# Patient Record
Sex: Male | Born: 1940 | Race: White | Hispanic: No | Marital: Married | Smoking: Never smoker
Health system: Southern US, Community
[De-identification: ages and names within clinical notes are randomized; demographics above are authoritative.]

## PROBLEM LIST (undated history)

## (undated) DIAGNOSIS — E785 Hyperlipidemia, unspecified: Secondary | ICD-10-CM

## (undated) DIAGNOSIS — I4892 Unspecified atrial flutter: Secondary | ICD-10-CM

## (undated) DIAGNOSIS — K219 Gastro-esophageal reflux disease without esophagitis: Secondary | ICD-10-CM

## (undated) DIAGNOSIS — I2510 Atherosclerotic heart disease of native coronary artery without angina pectoris: Secondary | ICD-10-CM

## (undated) DIAGNOSIS — I219 Acute myocardial infarction, unspecified: Secondary | ICD-10-CM

## (undated) DIAGNOSIS — I10 Essential (primary) hypertension: Secondary | ICD-10-CM

## (undated) DIAGNOSIS — F1011 Alcohol abuse, in remission: Secondary | ICD-10-CM

## (undated) DIAGNOSIS — G4730 Sleep apnea, unspecified: Secondary | ICD-10-CM

## (undated) HISTORY — DX: Hyperlipidemia, unspecified: E78.5

## (undated) HISTORY — DX: Alcohol abuse, in remission: F10.11

## (undated) HISTORY — DX: Essential (primary) hypertension: I10

## (undated) HISTORY — DX: Atherosclerotic heart disease of native coronary artery without angina pectoris: I25.10

## (undated) HISTORY — DX: Gastro-esophageal reflux disease without esophagitis: K21.9

## (undated) HISTORY — PX: CORONARY STENT PLACEMENT: SHX1402

## (undated) HISTORY — PX: CARDIAC CATHETERIZATION: SHX172

## (undated) HISTORY — DX: Unspecified atrial flutter: I48.92

---

## 1999-09-15 ENCOUNTER — Inpatient Hospital Stay (HOSPITAL_COMMUNITY): Admission: EM | Admit: 1999-09-15 | Discharge: 1999-09-20 | Payer: Self-pay | Admitting: Emergency Medicine

## 2000-06-11 ENCOUNTER — Ambulatory Visit (HOSPITAL_COMMUNITY): Admission: RE | Admit: 2000-06-11 | Discharge: 2000-06-11 | Payer: Self-pay | Admitting: Gastroenterology

## 2000-06-11 ENCOUNTER — Encounter: Payer: Self-pay | Admitting: Gastroenterology

## 2004-08-15 ENCOUNTER — Ambulatory Visit: Payer: Self-pay | Admitting: Internal Medicine

## 2004-09-25 ENCOUNTER — Ambulatory Visit (HOSPITAL_COMMUNITY): Admission: RE | Admit: 2004-09-25 | Discharge: 2004-09-25 | Payer: Self-pay | Admitting: Internal Medicine

## 2004-09-25 ENCOUNTER — Ambulatory Visit: Payer: Self-pay | Admitting: Internal Medicine

## 2004-09-25 HISTORY — PX: COLONOSCOPY: SHX174

## 2005-01-18 ENCOUNTER — Ambulatory Visit: Payer: Self-pay | Admitting: Cardiovascular Disease

## 2005-11-08 ENCOUNTER — Ambulatory Visit: Payer: Self-pay | Admitting: Internal Medicine

## 2006-03-29 ENCOUNTER — Ambulatory Visit: Payer: Self-pay | Admitting: Cardiovascular Disease

## 2006-04-29 ENCOUNTER — Ambulatory Visit: Payer: Self-pay

## 2006-04-29 ENCOUNTER — Ambulatory Visit: Payer: Self-pay | Admitting: Cardiovascular Disease

## 2006-11-01 ENCOUNTER — Ambulatory Visit: Payer: Self-pay | Admitting: Cardiovascular Disease

## 2006-11-06 ENCOUNTER — Ambulatory Visit: Payer: Self-pay | Admitting: Cardiovascular Disease

## 2006-11-13 ENCOUNTER — Ambulatory Visit: Payer: Self-pay | Admitting: Internal Medicine

## 2006-11-14 ENCOUNTER — Ambulatory Visit: Payer: Self-pay | Admitting: Cardiovascular Disease

## 2006-11-14 ENCOUNTER — Ambulatory Visit (HOSPITAL_COMMUNITY): Admission: RE | Admit: 2006-11-14 | Discharge: 2006-11-14 | Payer: Self-pay | Admitting: Cardiovascular Disease

## 2006-11-22 ENCOUNTER — Ambulatory Visit: Payer: Self-pay | Admitting: Internal Medicine

## 2006-12-04 ENCOUNTER — Ambulatory Visit: Payer: Self-pay | Admitting: Internal Medicine

## 2006-12-04 ENCOUNTER — Encounter (INDEPENDENT_AMBULATORY_CARE_PROVIDER_SITE_OTHER): Payer: Self-pay | Admitting: Specialist

## 2006-12-04 ENCOUNTER — Ambulatory Visit (HOSPITAL_COMMUNITY): Admission: RE | Admit: 2006-12-04 | Discharge: 2006-12-04 | Payer: Self-pay | Admitting: Internal Medicine

## 2006-12-04 HISTORY — PX: COLONOSCOPY: SHX174

## 2007-03-11 ENCOUNTER — Ambulatory Visit: Payer: Self-pay | Admitting: Internal Medicine

## 2007-04-02 ENCOUNTER — Ambulatory Visit (HOSPITAL_COMMUNITY): Admission: RE | Admit: 2007-04-02 | Discharge: 2007-04-02 | Payer: Self-pay | Admitting: Internal Medicine

## 2007-04-02 ENCOUNTER — Encounter: Payer: Self-pay | Admitting: Internal Medicine

## 2007-04-02 ENCOUNTER — Ambulatory Visit: Payer: Self-pay | Admitting: Internal Medicine

## 2007-04-02 HISTORY — PX: COLONOSCOPY: SHX174

## 2008-02-10 ENCOUNTER — Ambulatory Visit (HOSPITAL_COMMUNITY): Admission: RE | Admit: 2008-02-10 | Discharge: 2008-02-10 | Payer: Self-pay | Admitting: Ophthalmology

## 2008-06-02 DIAGNOSIS — E785 Hyperlipidemia, unspecified: Secondary | ICD-10-CM

## 2008-06-02 DIAGNOSIS — I10 Essential (primary) hypertension: Secondary | ICD-10-CM | POA: Insufficient documentation

## 2009-08-05 ENCOUNTER — Encounter: Payer: Self-pay | Admitting: Cardiovascular Disease

## 2009-08-17 ENCOUNTER — Ambulatory Visit: Payer: Self-pay | Admitting: Cardiovascular Disease

## 2010-01-19 ENCOUNTER — Encounter (INDEPENDENT_AMBULATORY_CARE_PROVIDER_SITE_OTHER): Payer: Self-pay

## 2010-02-10 ENCOUNTER — Encounter: Payer: Self-pay | Admitting: Cardiovascular Disease

## 2010-04-17 ENCOUNTER — Encounter (INDEPENDENT_AMBULATORY_CARE_PROVIDER_SITE_OTHER): Payer: Self-pay | Admitting: *Deleted

## 2010-07-24 ENCOUNTER — Telehealth (INDEPENDENT_AMBULATORY_CARE_PROVIDER_SITE_OTHER): Payer: Self-pay | Admitting: *Deleted

## 2010-07-25 ENCOUNTER — Ambulatory Visit: Payer: Self-pay | Admitting: Cardiovascular Disease

## 2010-08-01 ENCOUNTER — Telehealth: Payer: Self-pay | Admitting: Cardiovascular Disease

## 2010-08-07 ENCOUNTER — Telehealth (INDEPENDENT_AMBULATORY_CARE_PROVIDER_SITE_OTHER): Payer: Self-pay | Admitting: Radiology

## 2010-08-08 ENCOUNTER — Encounter: Payer: Self-pay | Admitting: Cardiology

## 2010-08-08 ENCOUNTER — Ambulatory Visit: Payer: Self-pay

## 2010-08-08 ENCOUNTER — Ambulatory Visit: Payer: Self-pay | Admitting: Cardiology

## 2010-08-08 ENCOUNTER — Ambulatory Visit (HOSPITAL_COMMUNITY): Admission: RE | Admit: 2010-08-08 | Discharge: 2010-08-08 | Payer: Self-pay | Admitting: Cardiovascular Disease

## 2010-08-08 ENCOUNTER — Encounter: Payer: Self-pay | Admitting: Cardiovascular Disease

## 2010-08-08 ENCOUNTER — Encounter (HOSPITAL_COMMUNITY)
Admission: RE | Admit: 2010-08-08 | Discharge: 2010-10-17 | Payer: Self-pay | Source: Home / Self Care | Attending: Cardiovascular Disease | Admitting: Cardiovascular Disease

## 2010-08-09 ENCOUNTER — Ambulatory Visit: Payer: Self-pay | Admitting: Cardiology

## 2010-08-09 LAB — CONVERTED CEMR LAB: POC INR: 3.4

## 2010-08-14 ENCOUNTER — Ambulatory Visit: Payer: Self-pay | Admitting: Cardiology

## 2010-08-14 LAB — CONVERTED CEMR LAB: POC INR: 7.5

## 2010-08-16 ENCOUNTER — Encounter: Payer: Self-pay | Admitting: Cardiovascular Disease

## 2010-08-16 ENCOUNTER — Ambulatory Visit: Payer: Self-pay | Admitting: Cardiology

## 2010-08-16 LAB — CONVERTED CEMR LAB: POC INR: 4.3

## 2010-08-17 ENCOUNTER — Encounter: Payer: Self-pay | Admitting: Cardiovascular Disease

## 2010-08-17 ENCOUNTER — Inpatient Hospital Stay (HOSPITAL_COMMUNITY)
Admission: AD | Admit: 2010-08-17 | Discharge: 2010-08-19 | Payer: Self-pay | Source: Home / Self Care | Admitting: Internal Medicine

## 2010-08-17 HISTORY — PX: COLONOSCOPY: SHX174

## 2010-08-17 LAB — CONVERTED CEMR LAB
Basophils Absolute: 0 10*3/uL (ref 0.0–0.1)
Basophils Relative: 0 % (ref 0–1)
Hemoglobin: 7 g/dL — ABNORMAL LOW (ref 13.0–17.0)
Lymphocytes Relative: 18 % (ref 12–46)
MCHC: 29.5 g/dL — ABNORMAL LOW (ref 30.0–36.0)
Monocytes Absolute: 0.9 10*3/uL (ref 0.1–1.0)
Neutro Abs: 5.5 10*3/uL (ref 1.7–7.7)
Neutrophils Relative %: 69 % (ref 43–77)
Platelets: 293 10*3/uL (ref 150–400)
RDW: 16.2 % — ABNORMAL HIGH (ref 11.5–15.5)

## 2010-08-18 HISTORY — PX: UPPER GASTROINTESTINAL ENDOSCOPY: SHX188

## 2010-08-21 ENCOUNTER — Encounter: Payer: Self-pay | Admitting: Cardiology

## 2010-08-23 ENCOUNTER — Ambulatory Visit: Payer: Self-pay | Admitting: Cardiovascular Disease

## 2010-08-24 ENCOUNTER — Ambulatory Visit: Payer: Self-pay | Admitting: Adult Health

## 2010-08-25 ENCOUNTER — Ambulatory Visit: Payer: Self-pay | Admitting: Internal Medicine

## 2010-08-25 ENCOUNTER — Ambulatory Visit (HOSPITAL_COMMUNITY)
Admission: RE | Admit: 2010-08-25 | Discharge: 2010-08-25 | Payer: Self-pay | Source: Home / Self Care | Attending: Internal Medicine | Admitting: Internal Medicine

## 2010-09-17 HISTORY — PX: SUBTOTAL COLECTOMY: SHX855

## 2010-10-01 ENCOUNTER — Inpatient Hospital Stay (HOSPITAL_COMMUNITY)
Admission: EM | Admit: 2010-10-01 | Discharge: 2010-10-10 | Payer: Self-pay | Source: Home / Self Care | Attending: Internal Medicine | Admitting: Internal Medicine

## 2010-10-02 LAB — DIFFERENTIAL
Basophils Absolute: 0.1 10*3/uL (ref 0.0–0.1)
Basophils Relative: 1 % (ref 0–1)
Eosinophils Absolute: 0.2 10*3/uL (ref 0.0–0.7)
Eosinophils Relative: 2 % (ref 0–5)
Lymphocytes Relative: 14 % (ref 12–46)
Lymphs Abs: 1.1 10*3/uL (ref 0.7–4.0)
Monocytes Absolute: 0.6 10*3/uL (ref 0.1–1.0)
Monocytes Relative: 7 % (ref 3–12)
Neutro Abs: 6.3 10*3/uL (ref 1.7–7.7)
Neutrophils Relative %: 76 % (ref 43–77)

## 2010-10-02 LAB — COMPREHENSIVE METABOLIC PANEL
ALT: 14 U/L (ref 0–53)
AST: 17 U/L (ref 0–37)
Albumin: 3.2 g/dL — ABNORMAL LOW (ref 3.5–5.2)
Alkaline Phosphatase: 29 U/L — ABNORMAL LOW (ref 39–117)
BUN: 12 mg/dL (ref 6–23)
CO2: 25 mEq/L (ref 19–32)
Calcium: 7.9 mg/dL — ABNORMAL LOW (ref 8.4–10.5)
Chloride: 101 mEq/L (ref 96–112)
Creatinine, Ser: 0.92 mg/dL (ref 0.4–1.5)
GFR calc Af Amer: >60 mL/min (ref >60)
GFR calc non Af Amer: >60 mL/min (ref >60)
Glucose, Bld: 118 mg/dL — ABNORMAL HIGH (ref 70–99)
Potassium: 3.1 mEq/L — ABNORMAL LOW (ref 3.5–5.1)
Sodium: 135 mEq/L (ref 135–145)
Total Bilirubin: 0.5 mg/dL (ref 0.3–1.2)
Total Protein: 5.5 g/dL — ABNORMAL LOW (ref 6.0–8.3)

## 2010-10-02 LAB — CBC
HCT: 30.5 % — ABNORMAL LOW (ref 39.0–52.0)
Hemoglobin: 9 g/dL — ABNORMAL LOW (ref 13.0–17.0)
MCH: 24.1 pg — ABNORMAL LOW (ref 26.0–34.0)
MCHC: 29.5 g/dL — ABNORMAL LOW (ref 30.0–36.0)
MCV: 81.8 fL (ref 78.0–100.0)
Platelets: 265 10*3/uL (ref 150–400)
RBC: 3.73 MIL/uL — ABNORMAL LOW (ref 4.22–5.81)
RDW: 21.1 % — ABNORMAL HIGH (ref 11.5–15.5)
WBC: 8.2 10*3/uL (ref 4.0–10.5)

## 2010-10-02 LAB — PROTIME-INR
INR: 1.29 (ref 0.00–1.49)
Prothrombin Time: 16.3 seconds — ABNORMAL HIGH (ref 11.6–15.2)

## 2010-10-02 LAB — APTT: aPTT: 26 seconds (ref 24–37)

## 2010-10-04 ENCOUNTER — Encounter (INDEPENDENT_AMBULATORY_CARE_PROVIDER_SITE_OTHER): Payer: Self-pay | Admitting: Internal Medicine

## 2010-10-04 LAB — BASIC METABOLIC PANEL
BUN: 11 mg/dL (ref 6–23)
BUN: 8 mg/dL (ref 6–23)
CO2: 26 mEq/L (ref 19–32)
CO2: 28 mEq/L (ref 19–32)
Calcium: 7.9 mg/dL — ABNORMAL LOW (ref 8.4–10.5)
Calcium: 8.4 mg/dL (ref 8.4–10.5)
Chloride: 106 mEq/L (ref 96–112)
Chloride: 109 mEq/L (ref 96–112)
Creatinine, Ser: 0.85 mg/dL (ref 0.4–1.5)
Creatinine, Ser: 0.89 mg/dL (ref 0.4–1.5)
GFR calc Af Amer: >60 mL/min (ref >60)
GFR calc Af Amer: >60 mL/min (ref >60)
GFR calc non Af Amer: >60 mL/min (ref >60)
GFR calc non Af Amer: >60 mL/min (ref >60)
Glucose, Bld: 130 mg/dL — ABNORMAL HIGH (ref 70–99)
Glucose, Bld: 94 mg/dL (ref 70–99)
Potassium: 3.4 mEq/L — ABNORMAL LOW (ref 3.5–5.1)
Potassium: 3.9 mEq/L (ref 3.5–5.1)
Sodium: 138 mEq/L (ref 135–145)
Sodium: 141 mEq/L (ref 135–145)

## 2010-10-04 LAB — DIFFERENTIAL
Basophils Absolute: 0.1 10*3/uL (ref 0.0–0.1)
Basophils Relative: 2 % — ABNORMAL HIGH (ref 0–1)
Eosinophils Absolute: 0.2 10*3/uL (ref 0.0–0.7)
Eosinophils Relative: 4 % (ref 0–5)
Lymphocytes Relative: 23 % (ref 12–46)
Lymphs Abs: 1.3 10*3/uL (ref 0.7–4.0)
Monocytes Absolute: 0.8 10*3/uL (ref 0.1–1.0)
Monocytes Relative: 14 % — ABNORMAL HIGH (ref 3–12)
Neutro Abs: 3.1 10*3/uL (ref 1.7–7.7)
Neutrophils Relative %: 57 % (ref 43–77)

## 2010-10-04 LAB — CBC
HCT: 27.2 % — ABNORMAL LOW (ref 39.0–52.0)
HCT: 30.4 % — ABNORMAL LOW (ref 39.0–52.0)
Hemoglobin: 8.1 g/dL — ABNORMAL LOW (ref 13.0–17.0)
Hemoglobin: 9.1 g/dL — ABNORMAL LOW (ref 13.0–17.0)
MCH: 24 pg — ABNORMAL LOW (ref 26.0–34.0)
MCH: 24.6 pg — ABNORMAL LOW (ref 26.0–34.0)
MCHC: 29.8 g/dL — ABNORMAL LOW (ref 30.0–36.0)
MCHC: 29.9 g/dL — ABNORMAL LOW (ref 30.0–36.0)
MCV: 80.5 fL (ref 78.0–100.0)
MCV: 82.2 fL (ref 78.0–100.0)
Platelets: 231 10*3/uL (ref 150–400)
Platelets: 192 10*3/uL (ref 150–400)
RBC: 3.38 MIL/uL — ABNORMAL LOW (ref 4.22–5.81)
RBC: 3.7 MIL/uL — ABNORMAL LOW (ref 4.22–5.81)
RDW: 20.8 % — ABNORMAL HIGH (ref 11.5–15.5)
RDW: 20.3 % — ABNORMAL HIGH (ref 11.5–15.5)
WBC: 7.4 10*3/uL (ref 4.0–10.5)
WBC: 5.4 10*3/uL (ref 4.0–10.5)

## 2010-10-04 LAB — PREPARE RBC (CROSSMATCH)

## 2010-10-04 LAB — HEMOGLOBIN AND HEMATOCRIT, BLOOD
HCT: 30.5 % — ABNORMAL LOW (ref 39.0–52.0)
Hemoglobin: 9.6 g/dL — ABNORMAL LOW (ref 13.0–17.0)

## 2010-10-09 LAB — DIFFERENTIAL
Basophils Absolute: 0.1 10*3/uL (ref 0.0–0.1)
Basophils Absolute: 0.1 10*3/uL (ref 0.0–0.1)
Basophils Relative: 2 % — ABNORMAL HIGH (ref 0–1)
Basophils Relative: 0 % (ref 0–1)
Eosinophils Absolute: 0.4 10*3/uL (ref 0.0–0.7)
Eosinophils Absolute: 0.1 10*3/uL (ref 0.0–0.7)
Eosinophils Relative: 6 % — ABNORMAL HIGH (ref 0–5)
Eosinophils Relative: 0 % (ref 0–5)
Lymphocytes Relative: 25 % (ref 12–46)
Lymphocytes Relative: 10 % — ABNORMAL LOW (ref 12–46)
Lymphocytes Relative: 5 % — ABNORMAL LOW (ref 12–46)
Lymphs Abs: 1.7 10*3/uL (ref 0.7–4.0)
Lymphs Abs: 2 10*3/uL (ref 0.7–4.0)
Lymphs Abs: 0.9 10*3/uL (ref 0.7–4.0)
Monocytes Absolute: 0.8 10*3/uL (ref 0.1–1.0)
Monocytes Absolute: 1.5 10*3/uL — ABNORMAL HIGH (ref 0.1–1.0)
Monocytes Relative: 12 % (ref 3–12)
Monocytes Relative: 8 % (ref 3–12)
Monocytes Relative: 9 % (ref 3–12)
Neutro Abs: 3.7 10*3/uL (ref 1.7–7.7)
Neutro Abs: 15.7 10*3/uL — ABNORMAL HIGH (ref 1.7–7.7)
Neutro Abs: 16.6 10*3/uL — ABNORMAL HIGH (ref 1.7–7.7)
Neutrophils Relative %: 56 % (ref 43–77)
Neutrophils Relative %: 81 % — ABNORMAL HIGH (ref 43–77)
Neutrophils Relative %: 87 % — ABNORMAL HIGH (ref 43–77)

## 2010-10-09 LAB — BASIC METABOLIC PANEL
BUN: 10 mg/dL (ref 6–23)
BUN: 11 mg/dL (ref 6–23)
BUN: 24 mg/dL — ABNORMAL HIGH (ref 6–23)
CO2: 29 mEq/L (ref 19–32)
CO2: 28 mEq/L (ref 19–32)
CO2: 28 mEq/L (ref 19–32)
Calcium: 8.4 mg/dL (ref 8.4–10.5)
Calcium: 8.1 mg/dL — ABNORMAL LOW (ref 8.4–10.5)
Chloride: 107 mEq/L (ref 96–112)
Chloride: 106 mEq/L (ref 96–112)
Chloride: 104 mEq/L (ref 96–112)
Chloride: 103 mEq/L (ref 96–112)
Creatinine, Ser: 1.08 mg/dL (ref 0.4–1.5)
Creatinine, Ser: 1.11 mg/dL (ref 0.4–1.5)
GFR calc Af Amer: >60 mL/min (ref >60)
GFR calc Af Amer: >60 mL/min (ref >60)
GFR calc Af Amer: >60 mL/min (ref >60)
GFR calc non Af Amer: >60 mL/min (ref >60)
GFR calc non Af Amer: >60 mL/min (ref >60)
GFR calc non Af Amer: 40 mL/min — ABNORMAL LOW (ref >60)
Glucose, Bld: 102 mg/dL — ABNORMAL HIGH (ref 70–99)
Glucose, Bld: 141 mg/dL — ABNORMAL HIGH (ref 70–99)
Glucose, Bld: 124 mg/dL — ABNORMAL HIGH (ref 70–99)
Glucose, Bld: 118 mg/dL — ABNORMAL HIGH (ref 70–99)
Potassium: 3.7 mEq/L (ref 3.5–5.1)
Potassium: 3.9 mEq/L (ref 3.5–5.1)
Potassium: 4 mEq/L (ref 3.5–5.1)
Potassium: 4 mEq/L (ref 3.5–5.1)
Sodium: 141 mEq/L (ref 135–145)
Sodium: 141 mEq/L (ref 135–145)
Sodium: 139 mEq/L (ref 135–145)
Sodium: 138 mEq/L (ref 135–145)

## 2010-10-09 LAB — CBC
HCT: 30.5 % — ABNORMAL LOW (ref 39.0–52.0)
HCT: 32.5 % — ABNORMAL LOW (ref 39.0–52.0)
Hemoglobin: 9.5 g/dL — ABNORMAL LOW (ref 13.0–17.0)
Hemoglobin: 10.3 g/dL — ABNORMAL LOW (ref 13.0–17.0)
Hemoglobin: 9.8 g/dL — ABNORMAL LOW (ref 13.0–17.0)
MCH: 25.6 pg — ABNORMAL LOW (ref 26.0–34.0)
MCH: 26.3 pg (ref 26.0–34.0)
MCH: 26.3 pg (ref 26.0–34.0)
MCHC: 31.1 g/dL (ref 30.0–36.0)
MCHC: 31.7 g/dL (ref 30.0–36.0)
MCV: 82.2 fL (ref 78.0–100.0)
MCV: 82.9 fL (ref 78.0–100.0)
MCV: 83.1 fL (ref 78.0–100.0)
Platelets: 214 10*3/uL (ref 150–400)
Platelets: 292 10*3/uL (ref 150–400)
Platelets: 257 10*3/uL (ref 150–400)
RBC: 3.71 MIL/uL — ABNORMAL LOW (ref 4.22–5.81)
RBC: 3.92 MIL/uL — ABNORMAL LOW (ref 4.22–5.81)
RBC: 3.73 MIL/uL — ABNORMAL LOW (ref 4.22–5.81)
RDW: 20 % — ABNORMAL HIGH (ref 11.5–15.5)
RDW: 20 % — ABNORMAL HIGH (ref 11.5–15.5)
WBC: 6.7 10*3/uL (ref 4.0–10.5)
WBC: 19.3 10*3/uL — ABNORMAL HIGH (ref 4.0–10.5)
WBC: 19.2 10*3/uL — ABNORMAL HIGH (ref 4.0–10.5)

## 2010-10-09 LAB — TYPE AND SCREEN
ABO/RH(D): A POS
Antibody Screen: NEGATIVE
Unit division: 0
Unit division: 0
Unit division: 0
Unit division: 0
Unit division: 0
Unit division: 0

## 2010-10-09 LAB — MRSA PCR SCREENING: MRSA by PCR: NEGATIVE

## 2010-10-09 LAB — PHOSPHORUS
Phosphorus: 3.3 mg/dL (ref 2.3–4.6)
Phosphorus: 3.5 mg/dL (ref 2.3–4.6)
Phosphorus: 4.5 mg/dL (ref 2.3–4.6)

## 2010-10-09 LAB — MAGNESIUM
Magnesium: 1.8 mg/dL (ref 1.5–2.5)
Magnesium: 1.6 mg/dL (ref 1.5–2.5)
Magnesium: 1.8 mg/dL (ref 1.5–2.5)

## 2010-10-10 LAB — CBC
HCT: 28.5 % — ABNORMAL LOW (ref 39.0–52.0)
Hemoglobin: 9 g/dL — ABNORMAL LOW (ref 13.0–17.0)
Hemoglobin: 9.5 g/dL — ABNORMAL LOW (ref 13.0–17.0)
MCH: 25.8 pg — ABNORMAL LOW (ref 26.0–34.0)
MCH: 25.4 pg — ABNORMAL LOW (ref 26.0–34.0)
MCH: 25.3 pg — ABNORMAL LOW (ref 26.0–34.0)
MCHC: 31.6 g/dL (ref 30.0–36.0)
MCHC: 31.7 g/dL (ref 30.0–36.0)
MCHC: 31.7 g/dL (ref 30.0–36.0)
MCV: 81.7 fL (ref 78.0–100.0)
MCV: 80.1 fL (ref 78.0–100.0)
Platelets: 303 10*3/uL (ref 150–400)
RBC: 3.51 MIL/uL — ABNORMAL LOW (ref 4.22–5.81)
RDW: 19.6 % — ABNORMAL HIGH (ref 11.5–15.5)
RDW: 19.2 % — ABNORMAL HIGH (ref 11.5–15.5)
RDW: 19.1 % — ABNORMAL HIGH (ref 11.5–15.5)

## 2010-10-10 LAB — BASIC METABOLIC PANEL
BUN: 21 mg/dL (ref 6–23)
CO2: 28 mEq/L (ref 19–32)
Calcium: 8.2 mg/dL — ABNORMAL LOW (ref 8.4–10.5)
Calcium: 8.2 mg/dL — ABNORMAL LOW (ref 8.4–10.5)
Chloride: 100 mEq/L (ref 96–112)
Creatinine, Ser: 0.84 mg/dL (ref 0.4–1.5)
Creatinine, Ser: 0.71 mg/dL (ref 0.4–1.5)
GFR calc Af Amer: >60 mL/min (ref >60)
GFR calc non Af Amer: >60 mL/min (ref >60)
Glucose, Bld: 115 mg/dL — ABNORMAL HIGH (ref 70–99)

## 2010-10-10 LAB — DIFFERENTIAL
Eosinophils Relative: 1 % (ref 0–5)
Lymphocytes Relative: 7 % — ABNORMAL LOW (ref 12–46)
Lymphs Abs: 0.7 10*3/uL (ref 0.7–4.0)
Monocytes Absolute: 1.1 10*3/uL — ABNORMAL HIGH (ref 0.1–1.0)
Monocytes Relative: 11 % (ref 3–12)
Neutro Abs: 8.4 10*3/uL — ABNORMAL HIGH (ref 1.7–7.7)

## 2010-10-10 LAB — COMPREHENSIVE METABOLIC PANEL
ALT: 15 U/L (ref 0–53)
AST: 20 U/L (ref 0–37)
Alkaline Phosphatase: 54 U/L (ref 39–117)
Calcium: 8.8 mg/dL (ref 8.4–10.5)
GFR calc Af Amer: >60 mL/min (ref >60)
Glucose, Bld: 123 mg/dL — ABNORMAL HIGH (ref 70–99)
Potassium: 3.8 mEq/L (ref 3.5–5.1)
Sodium: 140 mEq/L (ref 135–145)
Total Protein: 5.6 g/dL — ABNORMAL LOW (ref 6.0–8.3)

## 2010-10-10 LAB — MAGNESIUM: Magnesium: 2 mg/dL (ref 1.5–2.5)

## 2010-10-11 LAB — BASIC METABOLIC PANEL
BUN: 13 mg/dL (ref 6–23)
CO2: 30 mEq/L (ref 19–32)
Chloride: 101 mEq/L (ref 96–112)
Creatinine, Ser: 0.74 mg/dL (ref 0.4–1.5)

## 2010-10-13 ENCOUNTER — Inpatient Hospital Stay (HOSPITAL_COMMUNITY)
Admission: EM | Admit: 2010-10-13 | Discharge: 2010-10-17 | Disposition: A | Discharge status: Home / Self Care | Payer: Self-pay | Source: Home / Self Care | Attending: Internal Medicine | Admitting: Internal Medicine

## 2010-10-13 LAB — CBC
Hemoglobin: 9.7 g/dL — ABNORMAL LOW (ref 13.0–17.0)
MCH: 24.5 pg — ABNORMAL LOW (ref 26.0–34.0)
MCHC: 31.6 g/dL (ref 30.0–36.0)
MCV: 77.5 fL — ABNORMAL LOW (ref 78.0–100.0)
Platelets: 493 10*3/uL — ABNORMAL HIGH (ref 150–400)

## 2010-10-13 LAB — POCT CARDIAC MARKERS: Troponin i, poc: <0.05 ng/mL (ref 0.00–0.09)

## 2010-10-13 LAB — BRAIN NATRIURETIC PEPTIDE: Pro B Natriuretic peptide (BNP): 80.5 pg/mL (ref 0.0–100.0)

## 2010-10-13 LAB — PROTIME-INR: INR: 1.57 — ABNORMAL HIGH (ref 0.00–1.49)

## 2010-10-13 LAB — APTT: aPTT: 34 seconds (ref 24–37)

## 2010-10-13 LAB — DIFFERENTIAL
Basophils Relative: 0 % (ref 0–1)
Eosinophils Absolute: 0.1 10*3/uL (ref 0.0–0.7)
Lymphs Abs: 0.6 10*3/uL — ABNORMAL LOW (ref 0.7–4.0)
Monocytes Absolute: 1.4 10*3/uL — ABNORMAL HIGH (ref 0.1–1.0)
Monocytes Relative: 11 % (ref 3–12)

## 2010-10-13 LAB — COMPREHENSIVE METABOLIC PANEL
AST: 19 U/L (ref 0–37)
BUN: 43 mg/dL — ABNORMAL HIGH (ref 6–23)
CO2: 26 mEq/L (ref 19–32)
Calcium: 7.9 mg/dL — ABNORMAL LOW (ref 8.4–10.5)
Chloride: 95 mEq/L — ABNORMAL LOW (ref 96–112)
Creatinine, Ser: 2.43 mg/dL — ABNORMAL HIGH (ref 0.4–1.5)
GFR calc Af Amer: 32 mL/min — ABNORMAL LOW (ref >60)
GFR calc non Af Amer: 27 mL/min — ABNORMAL LOW (ref >60)
Total Bilirubin: 0.4 mg/dL (ref 0.3–1.2)

## 2010-10-13 LAB — TYPE AND SCREEN: ABO/RH(D): A POS

## 2010-10-13 LAB — HEMOGLOBIN AND HEMATOCRIT, BLOOD
HCT: 28.2 % — ABNORMAL LOW (ref 39.0–52.0)
Hemoglobin: 9.2 g/dL — ABNORMAL LOW (ref 13.0–17.0)

## 2010-10-14 LAB — BASIC METABOLIC PANEL
BUN: 50 mg/dL — ABNORMAL HIGH (ref 6–23)
Calcium: 7.9 mg/dL — ABNORMAL LOW (ref 8.4–10.5)
GFR calc non Af Amer: 45 mL/min — ABNORMAL LOW (ref >60)
Glucose, Bld: 108 mg/dL — ABNORMAL HIGH (ref 70–99)
Sodium: 137 mEq/L (ref 135–145)

## 2010-10-14 LAB — DIFFERENTIAL
Basophils Absolute: 0 10*3/uL (ref 0.0–0.1)
Eosinophils Absolute: 0.4 10*3/uL (ref 0.0–0.7)
Lymphs Abs: 0.6 10*3/uL — ABNORMAL LOW (ref 0.7–4.0)
Neutro Abs: 10 10*3/uL — ABNORMAL HIGH (ref 1.7–7.7)

## 2010-10-14 LAB — CBC
MCH: 24.5 pg — ABNORMAL LOW (ref 26.0–34.0)
MCHC: 31.6 g/dL (ref 30.0–36.0)
MCV: 77.5 fL — ABNORMAL LOW (ref 78.0–100.0)
Platelets: 478 10*3/uL — ABNORMAL HIGH (ref 150–400)
RDW: 19.7 % — ABNORMAL HIGH (ref 11.5–15.5)

## 2010-10-14 LAB — BRAIN NATRIURETIC PEPTIDE: Pro B Natriuretic peptide (BNP): 176 pg/mL — ABNORMAL HIGH (ref 0.0–100.0)

## 2010-10-15 LAB — CBC
Hemoglobin: 9.2 g/dL — ABNORMAL LOW (ref 13.0–17.0)
MCH: 24.3 pg — ABNORMAL LOW (ref 26.0–34.0)
MCHC: 31 g/dL (ref 30.0–36.0)
Platelets: 462 10*3/uL — ABNORMAL HIGH (ref 150–400)
RDW: 19.8 % — ABNORMAL HIGH (ref 11.5–15.5)

## 2010-10-15 LAB — DIFFERENTIAL
Basophils Relative: 0 % (ref 0–1)
Eosinophils Absolute: 0.3 10*3/uL (ref 0.0–0.7)
Eosinophils Relative: 3 % (ref 0–5)
Monocytes Absolute: 1.4 10*3/uL — ABNORMAL HIGH (ref 0.1–1.0)
Monocytes Relative: 13 % — ABNORMAL HIGH (ref 3–12)

## 2010-10-15 LAB — BASIC METABOLIC PANEL
CO2: 23 mEq/L (ref 19–32)
Calcium: 7.5 mg/dL — ABNORMAL LOW (ref 8.4–10.5)
Creatinine, Ser: 1.11 mg/dL (ref 0.4–1.5)
GFR calc Af Amer: >60 mL/min (ref >60)

## 2010-10-15 LAB — CONVERTED CEMR LAB
Calcium: 8.9 mg/dL (ref 8.4–10.5)
GFR calc non Af Amer: 56.02 mL/min (ref >60)
Glucose, Bld: 93 mg/dL (ref 70–99)
Potassium: 4.1 meq/L (ref 3.5–5.1)
Pro B Natriuretic peptide (BNP): 850 pg/mL — ABNORMAL HIGH (ref 0.0–100.0)
Sodium: 142 meq/L (ref 135–145)

## 2010-10-16 ENCOUNTER — Ambulatory Visit: Admit: 2010-10-16 | Payer: Self-pay | Admitting: Cardiology

## 2010-10-16 ENCOUNTER — Encounter (INDEPENDENT_AMBULATORY_CARE_PROVIDER_SITE_OTHER): Payer: Self-pay | Admitting: *Deleted

## 2010-10-16 LAB — WOUND CULTURE

## 2010-10-16 LAB — BRAIN NATRIURETIC PEPTIDE: Pro B Natriuretic peptide (BNP): 191 pg/mL — ABNORMAL HIGH (ref 0.0–100.0)

## 2010-10-17 LAB — DIFFERENTIAL
Basophils Absolute: 0.1 10*3/uL (ref 0.0–0.1)
Eosinophils Absolute: 0.2 10*3/uL (ref 0.0–0.7)
Eosinophils Relative: 2 % (ref 0–5)
Lymphocytes Relative: 8 % — ABNORMAL LOW (ref 12–46)
Lymphs Abs: 0.9 10*3/uL (ref 0.7–4.0)
Neutrophils Relative %: 77 % (ref 43–77)

## 2010-10-17 LAB — BASIC METABOLIC PANEL
BUN: 22 mg/dL (ref 6–23)
Calcium: 7.6 mg/dL — ABNORMAL LOW (ref 8.4–10.5)
GFR calc non Af Amer: >60 mL/min (ref >60)
Glucose, Bld: 91 mg/dL (ref 70–99)
Potassium: 3.9 mEq/L (ref 3.5–5.1)
Sodium: 139 mEq/L (ref 135–145)

## 2010-10-17 LAB — CBC
Platelets: 438 10*3/uL — ABNORMAL HIGH (ref 150–400)
RBC: 3.3 MIL/uL — ABNORMAL LOW (ref 4.22–5.81)
RDW: 20.2 % — ABNORMAL HIGH (ref 11.5–15.5)
WBC: 11.3 10*3/uL — ABNORMAL HIGH (ref 4.0–10.5)

## 2010-10-17 NOTE — Letter (Signed)
Summary: LABS  LABS   Imported By: Faythe Ghee 08/17/2010 15:01:51  _____________________________________________________________________  External Attachment:    Type:   Image     Comment:   External Document

## 2010-10-17 NOTE — Assessment & Plan Note (Signed)
Summary: rov/chest discomfort/dm  Medications Added ASACOL 400 MG TBEC (MESALAMINE) Take 3  tablets  by mouth 2 x a day SIMVASTATIN 20 MG TABS (SIMVASTATIN) Take one tablet by mouth daily at bedtime CLONIDINE HCL 0.1 MG TABS (CLONIDINE HCL) Take one tablet by mouth twice a day FENOFIBRATE 160 MG TABS (FENOFIBRATE) Take 1 tablet by mouth once a day PRADAXA 150 MG CAPS (DABIGATRAN ETEXILATE MESYLATE) 1 two times a day      Allergies Added: NKDA  Visit Type:  Follow-up  CC:  Chest discomfort.  History of Present Illness: Dennis Ray is seen today for F/U of CAD.  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  MRI showed 2./3 thickness scar in the inferior wall at mid and pical level with an EF of 55%.  He has had PAF/flutter with no need for antiarrythmics.  His BP has been a little hard to control and he has been compliant with multiple meds.  He has some fatigue. 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  His cholesterol is at target and followed by Dr. Ouida Sills in [place].  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   He has had more fatigue and less stamina the last month.  Some chest "pressure" or congestion and more dyspnea. In the office he was in afib again with good rate control  I explained to him the issues.  He needs to be anticoagulated and I prefrer Pradaxa as this will allow DCC in 3 weeks with no need to worry about regulating coumadin.  Rate contorl is fine.  Will also need F/U myovue and echo regarding symptoms given known CAD and recurrent arrythmia.  Reassess LV function and LA size.    Plan F/U in 3 weeks and likely schedule [facility].  Risk of stroke discussed with patient  Current Problems (verified): 1)  Shortness of Breath  (ICD-786.05) 2)  Chest Pain Unspecified  (ICD-786.50) 3)  Hx of Hypertension  (ICD-401.9) 4)  Hx of Cad  (ICD-414.00) 5)  Atrial Flutter  (ICD-427.32) 6)  Hx of  Hyperlipidemia  (ICD-272.4) 7)  Fh of Alcohol Abuse  (ICD-305.00) 8)  Ulcerative Colitis, Hx of  (ICD-V12.79) 9)  Hx of Inflammatory Bowel Disease  (ICD-558.9) 10)  Fh of Intraabdominal Cancer  () 11)  Hx of Gerd  (ICD-530.81) 12)  Hx of Colonic Dysplasia  ()  Current Medications (verified): 1)  Aspirin Ec Low Dose 81 Mg Tbec (Aspirin) .... Take 1 Tablet By Mouth Once A Day 2)  Asacol 400 Mg Tbec (Mesalamine) .... Take 3  Tablets  By Mouth 2 X A Day 3)  Lasix 40 Mg Tabs (Furosemide) .... Take 1 Tablet By Mouth Once A Day 4)  Simvastatin 20 Mg Tabs (Simvastatin) .... Take One Tablet By Mouth Daily At Bedtime 5)  Metoprolol Tartrate 50 Mg Tabs (Metoprolol Tartrate) .... Take 1 Tablet By Mouth Two Times A Day 6)  Lisinopril 40 Mg Tabs (Lisinopril) .... Take One Tablet By Mouth Daily 7)  Clonidine Hcl 0.1 Mg Tabs (Clonidine Hcl) .... Take One Tablet By Mouth Twice A Day 8)  Potassium Chloride Crys Cr 20 Meq Cr-Tabs (Potassium Chloride Crys Cr) .[name].. 1 Tab By Mouth Once Daily 9)  Nitrostat 0.4 Mg Subl (Nitroglycerin) .[name].. 1 Tablet Under Tongue At Onset of Chest Pain; You May Repeat Every 5 Minutes For Up To 3 Doses. 10)  Fenofibrate  160 Mg Tabs (Fenofibrate) .... Take 1 Tablet By Mouth Once A Day  Allergies (verified): No Known Drug Allergies  Past History:  Past Medical History: Last updated: 08/16/2009 Current Problems:  Hx of HYPERTENSION (ICD-401.9) Hx of CAD (ICD-414.00) ATRIAL FLUTTER (ICD-427.32) Hx of HYPERLIPIDEMIA (ICD-272.4) Family Hx of ALCOHOL ABUSE (ICD-305.00) ULCERATIVE COLITIS, HX OF (ICD-V12.79) Hx of INFLAMMATORY BOWEL DISEASE (ICD-558.9) * Family Hx of INTRAABDOMINAL CANCER Hx of GERD (ICD-530.81) * Hx of COLONIC DYSPLASIA  Past Surgical History: Last updated: 08/16/2009 Stent Placement-Cardiac  DESCRIPTION OF PROCEDURE:  The procedure was performed from the right femoral artery.  An indwelling 6-French sheath was exchanged for a 7-French sheath.  AR4Bright Tip  guiding catheter was used to intubate the right coronary artery.The lesion was crossed with a 0.014 Hi-Torque floppy wire.  A 2.5 mm x 20.0 mm length______ balloon was used to predilate the lesion.  We then stented the lesion with a 13.0 mm length x 3.67mm BX velocity stent.  This was post-dilated with a 3.64m balloon.  There was a reduction in the stenosis from 80% down to 0%, withmarkedimprovement in the appearance of the vessel. The intraluminal thrombus was no longer present.  The stent was post-dilated using a 3.5 mm x 10.0 mm length______balloon.  Two dilatations were performed.  The procedure was completed.  The guiding catheter was removed.  The femoral sheath was sewn into place.  He was taken to the holding area in satisfactory clinical condition.Heparin was given at the beginning of the case.  ReoPro was also administeredduring the procedure.  The ACT was checked and was in the appropriate range.CONCLUSION:  Successful percutaneous intervention with stenting of the distal portion of the mid-right coronary artery.  colonoscopy  Family History: Last updated: 08/16/2009  Mother succumbed to some intra-abdominal cancer.  Father  burned to death in a fire.  Otherwise no chronic GI or liver illness.  Social History: Last updated: 08/16/2009 The patient has been married for 41 years.  He has two  children.  He works for [employer].  No tobacco.  Rarely  consumes alcohol.  Review of Systems       Denies fever, malais, weight loss, blurry vision, decreased visual acuity, cough, sputum, , hemoptysis, pleuritic pain, palpitaitons, heartburn, abdominal pain, melena, lower extremity edema, claudication, or rash.   Vital Signs:  Patient profile:   70 year old male Height:      77 inches Weight:      297 pounds BMI:     35.35 Pulse rate:   53 / minute Pulse rhythm:   irregular Resp:     18 per minute BP sitting:   140 / 74  (left arm) Cuff size:   large  Vitals Entered  By: Vikki Ports (July 25, 2010 11:01 AM)  Physical Exam  General:  Affect appropriate Healthy:  appears stated age HEENT: normal Neck supple with no adenopathy JVP normal no bruits no thyromegaly Lungs clear with no wheezing and good diaphragmatic motion Heart:  S1/S2 no murmur,rub, gallop or click PMI normal Abdomen: benighn, BS positve, no tenderness, no AAA no bruit.  No HSM or HJR Distal pulses intact with no bruits No edema Neuro non-focal Skin warm and dry    Impression & Recommendations:  Problem # 1:  SHORTNESS OF BREATH (ICD-786.05) Check echo reasees EF may be do to afib and loss of atrial kick The following medications were removed from the medication list:    Amlodipine Besylate 10 Mg  Tabs (Amlodipine besylate) .[name]... Take one tablet by mouth daily His updated medication list for this problem includes:    Aspirin Ec Low Dose 81 Mg Tbec (Aspirin) .[name]... Take 1 tablet by mouth once a day    Lasix 40 Mg Tabs (Furosemide) .[name]... Take 1 tablet by mouth once a day    Metoprolol Tartrate 50 Mg Tabs (Metoprolol tartrate) .[name]... Take 1 tablet by mouth two times a day    Lisinopril 40 Mg Tabs (Lisinopril) .[name]... Take one tablet by mouth daily  Orders: TLB-BNP (B-Natriuretic Peptide) (83880-BNPR) Echocardiogram (Echo)  Problem # 2:  CHEST PAIN UNSPECIFIED (ICD-786.50)  "pressure with known disease and old IMI.  Lexiscan myovue The following medications were removed from the medication list:    Amlodipine Besylate 10 Mg Tabs (Amlodipine besylate) .[name]... Take one tablet by mouth daily His updated medication list for this problem includes:    Aspirin Ec Low Dose 81 Mg Tbec (Aspirin) .[name]... Take 1 tablet by mouth once a day    Metoprolol Tartrate 50 Mg Tabs (Metoprolol tartrate) .[name]... Take 1 tablet by mouth two times a day    Lisinopril 40 Mg Tabs (Lisinopril) .[name]... Take one tablet by mouth daily    Nitrostat 0.4 Mg Subl (Nitroglycerin) .[name]... 1 tablet under tongue at onset  of chest pain; you may repeat every 5 minutes for up to 3 doses.  Orders: EKG w/ Interpretation (93000) Nuclear Stress Test (Nuc Stress Test)  The following medications were removed from the medication list:    Amlodipine Besylate 10 Mg Tabs (Amlodipine besylate) .[name]... Take one tablet by mouth daily His updated medication list for this problem includes:    Aspirin Ec Low Dose 81 Mg Tbec (Aspirin) .[name]... Take 1 tablet by mouth once a day    Metoprolol Tartrate 50 Mg Tabs (Metoprolol tartrate) .[name]... Take 1 tablet by mouth two times a day    Lisinopril 40 Mg Tabs (Lisinopril) .[name]... Take one tablet by mouth daily    Nitrostat 0.4 Mg Subl (Nitroglycerin) .[name]... 1 tablet under tongue at onset of chest pain; you may repeat every 5 minutes for up to 3 doses.  Problem # 3:  Hx of HYPERTENSION (ICD-401.9)  "Well controlled continue meds The following medications were removed from the medication list:    Amlodipine Besylate 10 Mg Tabs (Amlodipine besylate) .[name]... Take one tablet by mouth daily His updated medication list for this problem includes:    Aspirin Ec Low Dose 81 Mg Tbec (Aspirin) .[name]... Take 1 tablet by mouth once a day    Lasix 40 Mg Tabs (Furosemide) .[name]... Take 1 tablet by mouth once a day    Metoprolol Tartrate 50 Mg Tabs (Metoprolol tartrate) .[name]... Take 1 tablet by mouth two times a day    Lisinopril 40 Mg Tabs (Lisinopril) .[name]... Take one tablet by mouth daily    Clonidine Hcl 0.1 Mg Tabs (Clonidine hcl) .[name]... Take one tablet by mouth twice a day  Orders: TLB-BMP (Basic Metabolic Panel-BMET) (80048-METABOL)  The following medications were removed from the medication list:    Amlodipine Besylate 10 Mg Tabs (Amlodipine besylate) .[name]... Take one tablet by mouth daily His updated medication list for this problem includes:    Aspirin Ec Low Dose 81 Mg Tbec (Aspirin) .[name]... Take 1 tablet by mouth once a day    Lasix 40 Mg Tabs (Furosemide) .[name]... Take 1 tablet by mouth once a day     Metoprolol Tartrate 50 Mg Tabs (Metoprolol tartrate) .[name]... Take 1  tablet by mouth two times a day    Lisinopril 40 Mg Tabs (Lisinopril) .[name]... Take one tablet by mouth daily    Clonidine Hcl 0.1 Mg Tabs (Clonidine hcl) .[name]... Take one tablet by mouth twice a day  Problem # 4:  ATRIAL FIBRILLATION (ICD-427.31) Start Pradaxa with eye to [facility] in 3 weeks.  Check BMET and BNP.  Rate control ok.  R/O worsening CAD or LV function with tests His updated medication list for this problem includes:    Aspirin Ec Low Dose 81 Mg Tbec (Aspirin) .[name]... Take 1 tablet by mouth once a day    Metoprolol Tartrate 50 Mg Tabs (Metoprolol tartrate) .[name]... Take 1 tablet by mouth two times a day  Patient Instructions: 1)  Your physician recommends that you schedule a follow-up appointment in: 3 WEEKS WITH DR Eden Emms  IN [place] IF AVAILABLE 2)  Your physician recommends that you return for lab work in: TODAY BMET BNP  3)  Your physician has recommended you make the following change in your medication: ADD PRADAXA 150 MG two times a day  4)  Your physician has requested that you have an echocardiogram.  Echocardiography is a painless test that uses sound waves to create images of your heart. It provides your doctor with information about the size and shape of your heart and how well your heart's chambers and valves are working.  This procedure takes approximately one hour. There are no restrictions for this procedure. 5)  Your physician has requested that you have an lexiscanmyoview.  For further information please visit [email or web address]  Please follow instruction sheet, as given. Prescriptions: PRADAXA 150 MG CAPS (DABIGATRAN ETEXILATE MESYLATE) 1 two times a day  #60 x 6   Entered by:   Scherrie Bateman, LPN   Authorized by:   Colon Branch, MD, [facility]   Signed by:   Scherrie Bateman, LPN on 16/06/9603   Method used:   Print then Give to Patient   RxID:   [id number]    EKG Report  Procedure date:   07/25/2010  Findings:      AFIB 53 Old IMI LVH

## 2010-10-17 NOTE — Progress Notes (Signed)
   Phone Note Call from Patient   Caller: Patient Summary of Call: talked with pt, he requested an appointment to be seen. for the last several weeks he has had increase fatique and SOB. he also reports getting a discomfort in his chest that comes and goes. the discomfort can be associated with eating and with exerction. pt is painfree at present. appt made for pt to see dr Eden Emms tomorrow. he will go to the er for eval prior to appt if needed Deliah Goody, RN  July 24, 2010 3:34 PM\par

## 2010-10-17 NOTE — Letter (Signed)
Summary: Plan of Care, Need to Discuss  [facility]  [address]   [place], [place] [address]   Phone: [phone]  Fax: [phone]    Jan 19, 2010  Dennis Ray [address] [place], [place]  [address] Jul 06, 1941   Dear Mr. Cardoza,  According to our records it is time for you to follow up at [facility] for a repeat colonoscopy. If you have not done so, please contact them about scheduling an appointment. If you have any questions, please feel free to give [name] a call.  Please do not neglect your health.   Sincerely,    Cloria Spring LPN  [facility] Ph: [phone]    Fax: [phone]

## 2010-10-17 NOTE — Assessment & Plan Note (Signed)
Summary: Cardiology Nuclear Testing  Nuclear Med Background Indications for Stress Test: Evaluation for Ischemia, Stent Patency, PTCA Patency   History: Angioplasty, CT/MRI, Heart Catheterization, Myocardial Infarction, Myocardial Perfusion Study, Stents  History Comments: '07 ZOX:WRUEAV; '08 Cardiac WUJ:[id number] scar at mid/apical level, EF=56%; '10 PTCA/Stent-RCA; h/o PAF/flutter  Symptoms: DOE, Fatigue, Fatigue with Exertion, Rapid HR    Nuclear Pre-Procedure Cardiac Risk Factors: Hypertension, Lipids, Obesity Caffeine/Decaff Intake: none NPO After: 9:00 PM Lungs: Clear.  O2 Sat 98% on RA. IV 0.9% NS with Angio Cath: 22g     IV Site: R Hand IV Started by: Cathlyn Parsons, RN Chest Size (in) 54     Height (in): 77 Weight (lb): 289 BMI: 34.39 Tech Comments: Metoprolol held x 24 hours.  Nuclear Med Study 1 or 2 day study:  1 day     Stress Test Type:  Treadmill/Lexiscan Reading MD:  Marca Ancona, MD     Referring MD:  Charlton Haws, MD Resting Radionuclide:  Technetium 74m Tetrofosmin     Resting Radionuclide Dose:  11 mCi  Stress Radionuclide:  Technetium 93m Tetrofosmin     Stress Radionuclide Dose:  33 mCi   Stress Protocol Exercise Time (min):  2:00 min     Max HR:  112 bpm     Predicted Max HR:  150 bpm  Max Systolic BP: 145 mm Hg     Percent Max HR:  74.67 %Rate Pressure Product:  29562  Lexiscan: 0.4 mg   Stress Test Technologist:  [employer], CMA-N     Nuclear Technologist:  Domenic Polite, CNMT  Rest Procedure  Myocardial perfusion imaging was performed at rest 45 minutes following the intravenous administration of Technetium 54m Tetrofosmin.  Stress Procedure  The patient received IV Lexiscan 0.4 mg over 15-seconds with concurrent low level exercise and then Technetium 47m Tetrofosmin was injected at 30-seconds.  There were no significant changes with infusion; other than occasional PVC's.  Quantitative spect images were obtained after a 45 minute  delay.  QPS Raw Data Images:  Normal; no motion artifact; normal heart/lung ratio. Stress Images:  Severe, moderate-sized inferior perfusion defect.  Rest Images:  Severe, moderate-sized inferior perfusion defect.  Subtraction (SDS):  Severe, moderate-sized fixed inferior perfusion defect.  Transient Ischemic Dilatation:  1.01  (Normal <1.22)  Lung/Heart Ratio:  0.28  (Normal <0.45)  Quantitative Gated Spect Images QGS cine images:  Not gated.    Overall Impression  Exercise Capacity: Lexiscan with no exercise. BP Response: Normal blood pressure response. Clinical Symptoms: Short of breath.  ECG Impression: Atrial fibrillation with LVH/repolarization abnormality.  No significant change with infusion.  Overall Impression: Severe, moderate-sized, fixed inferior perfusion defect consistent with prior inferior MI and no significant ischemia.   Appended Document: Cardiology Nuclear Testing old IMI no ishcemia on nuclear  Appended Document: Cardiology Nuclear Testing pt aware.

## 2010-10-17 NOTE — Medication Information (Signed)
Summary: CCN STARTING 08/02/10 ON 5MG   Anticoagulant Therapy  Managed by: Vashti Hey, RN Referring MD: Charlton Haws, MD PCP: Dr Scharlene Corn MD: Diona Browner MD, Remi Deter Indication 1: Atrial Fibrillation Lab Used: [facility] [place] Site: [place] INR POC 3.4  Dietary changes: no    Health status changes: no    Bleeding/hemorrhagic complications: no    Recent/future hospitalizations: yes       Details: Pending DCCV when theraputic x 3 weeks  Any changes in medication regimen? yes       Details: Was on Pradaxa but unable to tolerate due to GI upset  Recent/future dental: no  Any missed doses?: no       Is patient compliant with meds? yes       Allergies: No Known Drug Allergies  Anticoagulation Management History:      The patient comes in today for his initial visit for anticoagulation therapy.  Warfarin therapy is being given due to Atrial Fibrillation .  Positive risk factors for bleeding include an age of 71 years or older.  Negative risk factors for bleeding include no history of CVA/TIA, no history of GI bleeding, and absence of serious comorbidities.  The bleeding index is 'intermediate risk'.  Positive CHADS2 values include History of HTN.  Negative CHADS2 values include History of CHF, Age > 41 years old, History of Diabetes, and Prior Stroke/CVA/TIA.  The start date was 08/03/2010.  Anticoagulation responsible provider: Diona Browner MD, Remi Deter.  INR POC: 3.4.  Cuvette Lot#: [id number].    Anticoagulation Management Assessment/Plan:      The patient's current anticoagulation dose is Warfarin sodium 5 mg tabs: 1 once daily AS DIRECTED.  The target INR is 2.0-3.0.  The next INR is due 08/14/2010.  Anticoagulation instructions were given to patient.  Results were reviewed/authorized by Vashti Hey, RN.  He was notified by Vashti Hey RN.        Coagulation management information includes: 08/03/10 Started on coumadin 5mg  once daily .  Current Anticoagulation  Instructions: INR 3.4 Decrease dose to 5mg  once daily except 2.5mg  on Wednesdays and Saturdays

## 2010-10-17 NOTE — Miscellaneous (Signed)
  Clinical Lists Changes  Medications: Added new medication of NITROSTAT 0.4 MG SUBL (NITROGLYCERIN) 1 tablet under tongue at onset of chest pain; you may repeat every 5 minutes for up to 3 doses. - Signed Rx of NITROSTAT 0.4 MG SUBL (NITROGLYCERIN) 1 tablet under tongue at onset of chest pain; you may repeat every 5 minutes for up to 3 doses.;  #25 x 3;  Signed;  Entered by: Teressa Lower RN;  Authorized by: Colon Branch, MD, [facility];  Method used: Electronically to [facility] [address], [place], [place], [place]  [address], Ph: [phone], Fax: [phone]    Prescriptions: NITROSTAT 0.4 MG SUBL (NITROGLYCERIN) 1 tablet under tongue at onset of chest pain; you may repeat every 5 minutes for up to 3 doses.  #25 x 3   Entered by:   Teressa Lower RN   Authorized by:   Colon Branch, MD, [facility]   Signed by:   Teressa Lower RN on 04/17/2010   Method used:   Electronically to        [facility]* (retail)       [address]       [place], [place]  [address]       Ph: [phone]       Fax: [phone]   RxID:   [id number]

## 2010-10-17 NOTE — Progress Notes (Signed)
Summary: nuc pre-procedure  Phone Note Outgoing Call   Call placed by: Harlow Asa CNMT Call placed to: Patient Reason for Call: Confirm/change Appt Summary of Call: Reviewed information on Myoview Information Sheet (see scanned document for further details).  Spoke with patient.  Initial call taken by: Harlow Asa CNMT     Nuclear Med Background Indications for Stress Test: Evaluation for Ischemia, Stent Patency, PTCA Patency   History: Angioplasty, CT/MRI, Heart Catheterization, Myocardial Infarction, Myocardial Perfusion Study, Stents  History Comments: '07 MPI-NML, 11/10 cath/ptca/stents-RCA, MRI-inf. scar at mid & apical level / EF =55%  Symptoms: Chest Pain, Chest Pressure, DOE, Fatigue    Nuclear Pre-Procedure Cardiac Risk Factors: Hypertension, Lipids Height (in): 77

## 2010-10-17 NOTE — Progress Notes (Signed)
Summary: question re med  Phone Note Call from Patient   Caller: Patient [phone] Reason for Call: Talk to Nurse Summary of Call: pt has questions re med-can't take pradaxa tears up stomach- Initial call taken by: Glynda Jaeger,  August 01, 2010 2:14 PM  Follow-up for Phone Call        spoke with pt, he has ulcerative cholitis and the pradaxa is causing him to have alot gas and his stool is abnormal. he has not taken the pradaxa today. he is pening cardioversion and wants to know what to dao next. will foward for dr Eden Emms review Deliah Goody, RN  August 01, 2010 5:33 PM   Additional Follow-up for Phone Call Additional follow up Details #1::        PER DR NISHAN START PT ON WARFARIN 5 MG once daily  COUMADIN CLINIC  APPT SCHEDULED FOR 08/09/10 AT [facility] . PT PENDING CARDIOVERSION.CARDIOVERSION NOT SCHEDULED AT THIS TIME. PT AWARE OF ABOVE . Additional Follow-up by: Scherrie Bateman, LPN,  August 02, 2010 11:03 AM    New/Updated Medications: WARFARIN SODIUM 5 MG TABS (WARFARIN SODIUM) 1 once daily AS DIRECTED AMLODIPINE BESYLATE 10 MG TABS (AMLODIPINE BESYLATE) Take one tablet by mouth daily  Prescriptions: WARFARIN SODIUM 5 MG TABS (WARFARIN SODIUM) 1 once daily AS DIRECTED  #30 x 4   Entered by:   Scherrie Bateman, LPN   Authorized by:   Colon Branch, MD, [facility]   Signed by:   Scherrie Bateman, LPN on 82/95/6213   Method used:   Electronically to        [facility]* (retail)       [address] [place], [place]  [address]       Ph: [phone]       Fax: [phone]   RxID:   [id number]

## 2010-10-17 NOTE — Medication Information (Signed)
Summary: ccr-lr  Anticoagulant Therapy  Managed by: Vashti Hey, RN Referring MD: Charlton Haws, MD PCP: Dr Scharlene Corn MD: Diona Browner MD, Remi Deter Indication 1: Atrial Fibrillation Lab Used: [facility] [place] Site: [place] INR POC 7.5  Dietary changes: no    Health status changes: no    Bleeding/hemorrhagic complications: no    Recent/future hospitalizations: yes       Details: pending DCCV  Any changes in medication regimen? no    Recent/future dental: no  Any missed doses?: no       Is patient compliant with meds? yes       Allergies: No Known Drug Allergies  Anticoagulation Management History:      The patient is taking warfarin and comes in today for a routine follow up visit.  Warfarin therapy is being given due to Atrial Fibrillation .  Positive risk factors for bleeding include an age of 71 years or older.  Negative risk factors for bleeding include no history of CVA/TIA, no history of GI bleeding, and absence of serious comorbidities.  The bleeding index is 'intermediate risk'.  Positive CHADS2 values include History of HTN.  Negative CHADS2 values include History of CHF, Age > 3 years old, History of Diabetes, and Prior Stroke/CVA/TIA.  The start date was 08/03/2010.  Anticoagulation responsible provider: Diona Browner MD, Remi Deter.  INR POC: 7.5.    Anticoagulation Management Assessment/Plan:      The patient's current anticoagulation dose is Warfarin sodium 5 mg tabs: 1 once daily AS DIRECTED.  The target INR is 2.0-3.0.  The next INR is due 08/16/2010.  Anticoagulation instructions were given to patient.  Results were reviewed/authorized by Vashti Hey, RN.  He was notified by Vashti Hey RN.        Coagulation management information includes: 08/03/10 Started on coumadin 5mg  once daily   Pending DCCV when theraputic x 3 week.  Prior Anticoagulation Instructions: INR 3.4 Decrease dose to 5mg  once daily except 2.5mg  on Wednesdays and Saturdays  Current  Anticoagulation Instructions: INR 7.5   (2nd theraputic) Hold coumadin tonight and tomorrow night and recheck INR on Wed. 08/16/10

## 2010-10-17 NOTE — Medication Information (Signed)
Summary: [facility]  Anticoagulant Therapy  Managed by: Inactive Referring MD: Charlton Haws, MD PCP: Dr Scharlene Corn MD: Diona Browner MD, Remi Deter Indication 1: Atrial Fibrillation Lab Used: [facility] [place] Site: [place]          Comments: Coumadin stopped in the hospital 08/19/10 due to Hgb 7.0 with prob. bleed in prox. colon.  Pending colonoscopy next week.  ASA was discontinued as well.  Allergies: No Known Drug Allergies  Anticoagulation Management History:      Warfarin therapy is being given due to Atrial Fibrillation .  Positive risk factors for bleeding include an age of 8 years or older and presence of serious comorbidities.  Negative risk factors for bleeding include no history of CVA/TIA and no history of GI bleeding.  The bleeding index is 'intermediate risk'.  Positive CHADS2 values include History of HTN.  Negative CHADS2 values include History of CHF, Age > 35 years old, History of Diabetes, and Prior Stroke/CVA/TIA.  The start date was 08/03/2010.  Anticoagulation responsible provider: Diona Browner MD, Remi Deter.    Anticoagulation Management Assessment/Plan:      The patient's current anticoagulation dose is Warfarin sodium 5 mg tabs: 1 once daily AS DIRECTED.  The target INR is 2.0-3.0.  The next INR is due 08/21/2010.  Anticoagulation instructions were given to patient.  Results were reviewed/authorized by Inactive.         Prior Anticoagulation Instructions: INR 4.3 Hold coumadin tonight and tomorrow night then decrease dose to 2.5mg  once daily  Pt sent to [facility] for CBC due to pale appearance, fatigue and SOB

## 2010-10-17 NOTE — Medication Information (Signed)
Summary: ccr-lr  Anticoagulant Therapy  Managed by: Vashti Hey, RN Referring MD: Charlton Haws, MD PCP: Dr Scharlene Corn MD: Diona Browner MD, Remi Deter Indication 1: Atrial Fibrillation Lab Used: [facility] [place] Site: [place] INR POC 4.3  Dietary changes: no    Health status changes: no    Bleeding/hemorrhagic complications: no    Recent/future hospitalizations: no    Any changes in medication regimen? no    Recent/future dental: no  Any missed doses?: no       Is patient compliant with meds? yes       Allergies: No Known Drug Allergies  Anticoagulation Management History:      The patient is taking warfarin and comes in today for a routine follow up visit.  Warfarin therapy is being given due to Atrial Fibrillation .  Positive risk factors for bleeding include an age of 17 years or older.  Negative risk factors for bleeding include no history of CVA/TIA, no history of GI bleeding, and absence of serious comorbidities.  The bleeding index is 'intermediate risk'.  Positive CHADS2 values include History of HTN.  Negative CHADS2 values include History of CHF, Age > 38 years old, History of Diabetes, and Prior Stroke/CVA/TIA.  The start date was 08/03/2010.  Anticoagulation responsible provider: Diona Browner MD, Remi Deter.  INR POC: 4.3.  Cuvette Lot#: [id number].    Anticoagulation Management Assessment/Plan:      The patient's current anticoagulation dose is Warfarin sodium 5 mg tabs: 1 once daily AS DIRECTED.  The target INR is 2.0-3.0.  The next INR is due 08/21/2010.  Anticoagulation instructions were given to patient.  Results were reviewed/authorized by Vashti Hey, RN.  He was notified by Vashti Hey RN.         Prior Anticoagulation Instructions: INR 7.5   (2nd theraputic) Hold coumadin tonight and tomorrow night and recheck INR on Wed. 08/16/10  Current Anticoagulation Instructions: INR 4.3 Hold coumadin tonight and tomorrow night then decrease dose to 2.5mg  once  daily  Pt sent to [facility] for CBC due to pale appearance, fatigue and SOB  Appended Document: ccr-lr Received fax with results of CBC.  Hgb 7.0   Called and discussed low H/H with Dr Ouida Sills.  His office will call pt to come in to schedule transfusion and/or hospital admission.  Pt was not home but his wife was informed of above.

## 2010-10-19 NOTE — Letter (Signed)
Summary: [facility]   Imported By: Marylou Mccoy 08/25/2010 15:29:00  _____________________________________________________________________  External Attachment:    Type:   Image     Comment:   External Document

## 2010-10-20 NOTE — H&P (Signed)
NAME:  BERK, PILOT                 ACCOUNT NO.:  [id number]  MEDICAL RECORD NO.:  [id number]           PATIENT TYPE:  LOCATION:                                 FACILITY:  PHYSICIAN:  Oluwatobi Ruppe L. Juanetta Gosling, M.D.DATE OF BIRTH:  July 08, 1941  DATE OF ADMISSION: DATE OF DISCHARGE:  LH                             HISTORY & PHYSICAL   HISTORY OF PRESENT ILLNESS:  Mr. Epps is a 70 year old patient of Dr. Alonza Smoker who developed GI bleeding with complications from ulcerative colitis and was in the hospital starting on October 02, 2010.  He had a laparoscopic hand-assisted subtotal colectomy with ileal colonic anastomoses on October 04, 2010.  He says that he was having some problems with some nausea, postop eventually he was able to be discharged, and when he was discharged home.  He did well for about 24 hours then he started having much more problems with nausea had problems with some leaking from his operative site and came to the emergency room because of that.  He had called his surgeon yesterday, but I am not sure what happened.  I think his surgeon may actually be out of town.  Dr. Ouida Sills his primary care physician is also out of town.  PAST MEDICAL HISTORY:  Positive for hypertension, atrial fibrillation, ulcerative colitis, the recent sub total colectomy.  SOCIAL HISTORY:  He does not smoke.  He does not use any alcohol.  He does not use any illicit drugs.  He is on a number of medications at home, but we do not have doses of any of those, so I am going to wait for the report from family.  FAMILY HISTORY:  It shows that his father died from complications of alcohol abuse.  His mother died from colon cancer.  He has been on and off anticoagulants because of his atrial fibrillation including, Coumadin, Pradaxa but he has had some complications of both.  PHYSICAL EXAMINATION:  GENERAL:  Now shows a well-developed, well- nourished male who does not appear to be in any acute  distress. VITAL SIGNS:  His temperature is 98.2, pulse 117, however, respirations 20, blood pressure 109/63, O2 sats 96% on room air.  His weight is 123.46.  His height listed at 77 inches. HEENT:  His pupils are reactive.  Nose and throat are clear.  Mucous membranes are moist. NECK:  Supple without masses. CHEST:  Clear without wheezes, rales, or rhonchi. HEART:  His heart is irregular in atrial fib.  He does have a tachycardia. ABDOMEN:  Soft.  No masses are felt.  His abdomen shows a previous surgical scar.  He has some leaking from the staple line at about the area of the umbilicus.  LABORATORY DATA:  BNP was 80.5.  CBC shows white count 12,200, hemoglobin is 9.7, platelets 493.  Comp metabolic profile shows a potassium of 3.3, BUN of 43, creatinine 2.43.  His BUN was 13, creatinine 0.74 at the time of discharge from the hospital.  Cardiac markers myoglobin was somewhat high.  ASSESSMENT:  I think he is dehydrated.  He has leakage from the wound site and I  am not sure what that is.  He is in atrial fibrillation with rapid ventricular response.  He is going to need to have his heart rate slowed.  He is on Cardizem which is not effective at this point.  He is dehydrated, so I am going to push IV fluids.  I have asked for Dr. Lovell Sheehan.  Dr. Lovell Sheehan is Dr. Illene Regulus partner.  Dr. Leticia Penna did his surgery, but I believe he is out-of-town.     Sheniah Supak L. Juanetta Gosling, M.D.     ELH/MEDQ  D:  10/13/2010  T:  10/13/2010  Job:  [id number]  Electronically Signed by Kari Baars M.D. on 10/20/2010 08:20:55 AM

## 2010-10-20 NOTE — Progress Notes (Signed)
  NAME:  FARID, GRIGORIAN                 ACCOUNT NO.:  [id number]  MEDICAL RECORD NO.:  [id number]           PATIENT TYPE:  LOCATION:                                 FACILITY:  PHYSICIAN:  Yoshio Seliga L. Juanetta Gosling, M.D.DATE OF BIRTH:  1941/01/31  DATE OF PROCEDURE: DATE OF DISCHARGE:                                PROGRESS NOTE   Mr. Guymon is better this morning.  He has been having bowel movements. He has been able to get up and move around a little bit.  He feels a little bit stronger, but he is still weaker than he would like to be. He had some trouble sleeping, so I am going to increase his Ambien.  He is still complaining of some problems with his throat.  PHYSICAL EXAMINATION:  GENERAL:  He is awake and alert. VITAL SIGNS:  His temperature 97.5, pulse 80, respirations 18, blood pressure 133/76, O2 sats 99% on room air. CHEST:  Clear. ABDOMEN:  Soft.  He still has some drainage from the wound, and he is growing Gram-positive cocci in pairs and Gram-negative rods in the wound culture. NEUROLOGIC:  He looks more comfortable.  ASSESSMENT:  He has atrial fibrillation, which is chronic.  He has had a colectomy for ulcerative colitis and multiple episodes of bleeding.  He has what looks like a wound infection, and he is being treated and seems to be improving.  He is on low-dose Lovenox.  I did not start him on full anticoagulation.  I am going to ask for PT to see him to try to get him up and moving around a little bit better, increase his Ambien, continue with all of his other treatments and follow.     Joby Hershkowitz L. Juanetta Gosling, M.D.     ELH/MEDQ  D:  10/15/2010  T:  10/16/2010  Job:  [id number]  Electronically Signed by Kari Baars M.D. on 10/20/2010 08:21:04 AM

## 2010-10-20 NOTE — Progress Notes (Signed)
  NAME:  Dennis Ray, Dennis Ray                 ACCOUNT NO.:  [id number]  MEDICAL RECORD NO.:  [id number]          PATIENT TYPE:  INP  LOCATION:  A317                          FACILITY:  [facility]  PHYSICIAN:  Farrell Pantaleo L. Juanetta Ray, M.D.DATE OF BIRTH:  Sep 03, 1941  DATE OF PROCEDURE: DATE OF DISCHARGE:                                PROGRESS NOTE   Dennis Ray was admitted with rapid atrial fibrillation, some drainage from a surgical site, nausea and vomiting.  He says he is better this morning.  He does look better and he is not having as much drainage out of the abdominal site.  His exam shows his temperature is 97.8, pulse now about 60, still in atrial fibrillation, respirations are 16, blood pressure 151/72, O2 sats 97%.  He is complaining of some sore throat and requests Chloraseptic which I have provided.  He is otherwise he is doing okay with no new complaints.  His exam as mentioned he is still in atrial fibrillation, but his heart rate is slower.  His abdomen looks better and overall he looks better.  His hemoglobin this morning now is 9.8 with a white count of 12,800, platelets 478.  BMET shows his BUN is 50, creatinine 1.55 which is significantly improved from admission with a creatinine of 2.43.  His BNP this morning is 176.  ASSESSMENT:  He has multiple medical problems.  He is on IV metoprolol and IV diltiazem, and I am going to discontinue both of those and switch him to p.o.  I am going to give Chloraseptic lozenges.  He is on antibiotics for the drainage from his abdomen.  His hemoglobin level is holding.  His renal function is improving, so overall I think things are better.  I will plan to continue with his meds and treatments.  No changes except as mentioned above.     Dennis Ray, M.D.     ELH/MEDQ  D:  10/14/2010  T:  10/15/2010  Job:  [id number]  Electronically Signed by Kari Baars M.D. on 10/20/2010 08:21:01 AM

## 2010-10-23 NOTE — Consult Note (Addendum)
NAME:  Dennis Ray, Dennis Ray                 ACCOUNT NO.:  [id number]  MEDICAL RECORD NO.:  [id number]         PATIENT TYPE:  PINP  LOCATION:  A216                          FACILITY:  [facility]  PHYSICIAN:  Gerrit Friends. Dietrich Pates, MD, FACCDATE OF BIRTH:  September 03, 1941  DATE OF CONSULTATION:  10/03/2010 DATE OF DISCHARGE:                                CONSULTATION   PRIMARY CARDIOLOGIST:  Noralyn Pick. Eden Emms, MD, [facility].  PRIMARY CARE PHYSICIAN:  Dr. Ouida Sills  REASON FOR CONSULTATION:  Preop clearance for colectomy in the setting of GI bleed.  HISTORY OF PRESENT ILLNESS:  A 70 year old Caucasian male with known history of CAD, inferior MI in 2001, stent to the right coronary artery, AFib, not a Coumadin candidate secondary to recurrent GI bleed in the setting of ulcerative colitis.  He was also on Pradaxa for short period of time which was stopped in December 2011 after hospitalization for recurrent GI bleed.  He is being followed by Dr. Charlton Haws  for AFib with plans for a DC cardioversion once his INR was therapeutic; however, he is unable to tolerate anticoagulation.  He returned to the hospital on October 02, 2010, after passing a large bloody stool, found to have a hemoglobin of 8.1.  He has received 2 units of packed red blood cells. He was found to have a right colonic ulcer.  He is planned for colectomy on October 04, 2010.  We are asked for preop clearance prior to this.  REVIEW OF SYSTEMS:  Feeling tired and weak and positive for melena.  He is denying any shortness of breath, dizziness, or chest pain. All other systems are reviewed and are found to be negative unless listed above.  PAST MEDICAL HISTORY: 1. Atrial fibrillation, ulcerative colitis, CAD. a:  Inferior MI in 2001.  Cardiac catheterization at that time revealing    LAD normal, circumflex 30%, distal OM, RCA 20%, multiple lesions in the    proximal 40%, mid vessel and distal greater than 95%, status post stent    to distal right  coronary artery in 2001. b:  Stress test in November 2011 revealing severe moderate size fixed    inferior perfusion defect consistent with prior inferior MI without    significant ischemia. 2. Hypertension. 3. Chronic low back pain. 4. Hyperlipidemia. 5. History of ulcerative colitis with GI bleed.  SOCIAL HISTORY:  He lives in [place] with his wife.  He does not smoke, drink, or use drugs.    FAMILY HISTORY: Mother deceased from colon cancer.  Father deceased from EtOH.  CURRENT MEDICATIONS:  Prior to admission, Protonix 40 mg daily, Lasix 40 mg daily, lisinopril 40 mg daily, Norvasc 10 mg daily, metoprolol 50 mg b.i.d., clonidine 0.1 mg b.i.d., simvastatin 20 mg daily.  Fenofibrate 160 mg daily, Hydrocodone 7.5/50 t.i.d., zolpidem 10 mg p.r.n., potassium 20 mEq daily, Asacol 1 mg b.i.d.  ALLERGIES:  No known drug allergies.  LABORATORY DATA:  Current labs, sodium 141, potassium 3.9, chloride 109, CO2 28, BUN 8, creatinine 0.89, glucose 94, hemoglobin 9.1, hematocrit 30.4, white blood cells 5.4, platelets 192, PTT 26.  PT 16.3, INR 1.2.  EKG revealing atrial fibrillation with a evidence of inferior infarct rate is 70 beats per minute.  PHYSICAL EXAMINATION:  VITAL SIGNS:  Blood pressure 143/79, pulse 62, respirations 19, temperature 97.7, and O2 sat 93% on room air.  The patient's weight is 127 kg. GENERAL:  He is awake, alert, and oriented, very talkative. HEENT:  Head is normocephalic and atraumatic. EYES:  PERRLA. NECK:  Supple with no JVD.  No carotid bruits appreciated. CARDIOVASCULAR:  Irregular rhythm with 1/6 systolic murmur at the right sternal border.  Pulses are 2+ and equal.  There are no bruits auscultated. LUNGS:  Clear to auscultation without wheezes, rales, or rhonchi. ABDOMEN:  Soft, nontender, and 2+ bowel sounds. EXTREMITIES:  Without clubbing, cyanosis, or edema. MUSCULOSKELETAL:  No deformity or effusions. NEURO:  Cranial nerves II through XII  are grossly intact.  IMPRESSION: 1. Coronary artery disease status post inferior myocardial infarction     in 2001 with right coronary artery stent at that time.  Nuclear     study in November 2011 revealing no new areas of ischemia.  An echo     in 2011 revealing normal EF.  There is no chest pain or dyspnea     prior to this admission or now. 2. Atrial fibrillation rate currently controlled.  On metoprolol 50 mg     b.i.d. will need IV beta blocker postoperatively for rate control     and coronary artery disease.  If unable to take p.o. will need IV     Lopressor if available.  He will not be on any more Coumadin in the     future. 3. Hypertension on clonidine 0.1 mg b.i.d.  We will change to     clonidine TTS patch to avoid rebound hypertension when the patient     has become n.p.o., lisinopril could be restarted as soon as     possible postoperatively may need to have increased clonidine patch     for short term without lisinopril versus other IV antihypertensive.     We will follow closely throughout perioperative time.  This is a day 70 year old Caucasian male we are following for preop evaluation in the setting of GI bleed with ulcerative colitis and need for colectomy.  He has a moderate risk for CAD complications with AFib, nuclear study and echocardiogram are reassuring from November 2011.  The patient is with no other treatment options other than rate control as he is at a high risk for further bleeding in the setting of this chronic colonic ulcers.  Risk of surgery is elevated but acceptable.  No change in therapy.  We will monitor to reduce risks.  Continue antihypertensive post IV.  We will follow perioperatively.  On behalf the physicians and providers of [facility], we would like to thank Dr. Ouida Sills for allowing [name] to participate in the care of this patient.     Bettey Mare. Lyman Bishop, NP   ______________________________ Gerrit Friends. Dietrich Pates, MD,  [facility]    KML/MEDQ  D:  10/03/2010  T:  10/04/2010  Job:  [id number]  cc:   Dr. Ouida Sills  Electronically Signed by Joni Reining NP on 10/09/2010 05:00:38 PM Electronically Signed by Sparland Bing MD [facility] on 10/23/2010 08:17:53 AM

## 2010-10-24 NOTE — Discharge Summary (Signed)
NAME:  Dennis Ray, Dennis Ray                 ACCOUNT NO.:  [id number]  MEDICAL RECORD NO.:  [id number]          PATIENT TYPE:  INP  LOCATION:  A317                          FACILITY:  [facility]  PHYSICIAN:  Kingsley Callander. Ouida Sills, MD       DATE OF BIRTH:  06/12/1941  DATE OF ADMISSION:  10/13/2010 DATE OF DISCHARGE:  01/31/2012LH                              DISCHARGE SUMMARY   DISCHARGE DIAGNOSES: 1. Abdominal wound infection. 2. Recent subtotal colectomy. 3. Ulcerative colitis. 4. Atrial fibrillation. 5. Coronary artery disease. 6. Hypertension. 7. Hyperlipidemia. 8. Chronic back pain. 9. Thrush. 10.Hypokalemia. 11.Dehydration. 12.Anemia.  DISCHARGE MEDICATIONS: 1. Augmentin 875 mg b.i.d. for 10 days. 2. Diflucan 100 mg daily for 7 days. 3. Metoprolol 50 mg b.i.d. 4. Clonidine 0.1 mg b.i.d. 5. Lisinopril 40 mg daily. 6. Norvasc 10 mg daily. 7. Simvastatin 20 mg daily. 8. Fenofibrate 160 mg daily. 9. Hydrocodone 7.5/650 t.i.d. p.r.n. 10.Protonix 40 mg daily. 11.Ambien 10 mg at bedtime p.r.n. 12.Potassium 20 mEq daily. 13.Lasix will be held for 1 week.  HOSPITAL COURSE:  This patient is a 70 year old male who presented with a wound infection from his recent surgery.  He had undergone a subtotal colectomy for ulcerative colitis with recurrent bleeding.  He had developed nausea at home.  He had developed drainage from his wound.  He was admitted and started on IV antibiotics with cefoxitin.  His urine culture grew E. coli.  He has been afebrile.  He underwent a CT scan of the abdomen which revealed no sign of problems with his anastomosis.  He was not felt to have abscess formation.  There was some fluid identified which was felt to be normal from his postsurgical time.  His wound has been packed and dressed and will continue to be assessed and treated with Home Health.  He will have followup with Dr. Leticia Penna this Thursday.  He had atrial fibrillation with rapid ventricular response  initially requiring Cardizem and metoprolol.  He has been modified to oral metoprolol and diltiazem, but has had slowing of his heart rates into the 30s and 40s at times and diltiazem is being discontinued.  He will be continued on metoprolol as usual, which is previously controlled his rate well.  Hypertension has been controlled with metoprolol, clonidine, lisinopril, Norvasc, and Lasix in the past.  His Lasix is being held for now as he was somewhat dehydrated on admission with a BUN and creatinine of 43 and 2.4.  He developed hypokalemia at 3.0 but normalized to 3.9 with supplementation.  He complained of irritation in his mouth and throat and was treated for thrush with a Dukes mixture.  He will be continued on oral Diflucan as an outpatient.  He is anemic with a hemoglobin of 9.2.  This should be followed as an outpatient.  His dehydration has resolved with improvement in his BUN and creatinine to 22 and 1.07 with a potassium of 3.9.  His overall condition is significantly improved.  He is stable for discharge on the morning of October 17, 2010.  He is able to ambulate in the room without assistance.  As stated, dressing changes will be performed by Home Health.  He will be seen in followup in my office in 1 week.     Kingsley Callander. Ouida Sills, MD     ROF/MEDQ  D:  10/17/2010  T:  10/17/2010  Job:  [id number]  Electronically Signed by Carylon Perches MD on 10/24/2010 07:53:58 AM

## 2010-10-25 NOTE — Letter (Signed)
Summary: Appointment - Missed  [facility] [place]  [address], [place] [address]   Phone: [phone]  Fax: [phone]     October 16, 2010 MRN: [id number]   [name] [address] [place], [place]  [address]   Dear Mr. Shasteen,  Our records indicate you missed your appointment on       10/16/10 [facility]                          It is very important that we reach you to reschedule this appointment. We look forward to participating in your health care needs. Please contact us at the number listed above at your earliest convenience to reschedule this appointment.     Sincerely,    Glass blower/designer

## 2010-11-01 NOTE — Op Note (Signed)
NAME:  JAROD, BOZZO                 ACCOUNT NO.:  [id number]  MEDICAL RECORD NO.:  [id number]          PATIENT TYPE:  INP  LOCATION:  IC07                          FACILITY:  [facility]  PHYSICIAN:  Tilford Pillar, MD      DATE OF BIRTH:  05-31-1941  DATE OF PROCEDURE:  10/04/2010 DATE OF DISCHARGE:                              OPERATIVE REPORT   PREOPERATIVE DIAGNOSIS:  Ulcerative colitis.  POSTOPERATIVE DIAGNOSIS:  Ulcerative colitis.  PROCEDURE:  Laparoscopic hand-assisted subtotal colectomy with ileocolonic anastomoses.  SURGEON:  Tilford Pillar, MD  ANESTHESIA:  General endotracheal.  LOCAL ANESTHETIC:  None.  ESTIMATED BLOOD LOSS:  300 mL.  SPECIMEN:  Terminal ileum:  Proximal colon up to the sigmoid colon, he notices a mid sigmoid colon.  INDICATION:  The patient is a 70 year old male who I actually met as an outpatient for evaluation of a possible elective subtotal colectomy for history of ulcerative colitis.  He had been diagnosed over 10 years ago and was advised by his gastroenterologist, Dr. Karilyn Cota to proceed with a near total colon resection.  The patient had several questions and was somewhat hesitant.  Unfortunately, the patient did develop some bleeding episodes from some of his ulcerations and did present to [facility] for management of this, he was hemodynamically stable after several transfusions.  At this time, it was rediscussed with the patient the indications and recommendations to proceed with a subtotal colectomy.  Risks, benefits, and alternatives of laparoscopic hand- assisted possible open subtotal colectomy were discussed at length with the patient including but not limited risk of bleeding, infection, anastomotic leak as well as possibility of intraoperative cardiac and pulmonary events.  His questions and concerns were addressed and the patient was consented for planned procedure.  OPERATION:  The patient was taken to the operating room,  was placed in supine position on the operating room table, at which time the general anesthetic was administered.  Once the patient was asleep, he was endotracheally intubated by Anesthesia.  At this point, a Foley catheter was placed in a standard sterile fashion by the operating room staff and his abdomen was prepped with DuraPrep solution and draped in standard fashion.  A stab incision was created at Palmer's point, the left subcostal margin with 11 blade scalpel, a Veress needle was inserted. Saline drop test utilized to confirm intraperitoneal placement and then pneumoperitoneum was initially.  Once sufficient pneumoperitoneum was obtained, an 11-trocar was inserted in the epigastrium over laparoscope allowing visualization of the trocar entering into peritoneal cavity. At this time, the inner cannula was removed.  The laparoscope was reinserted and there was no any trocar or Veress needle placement injury.  At this time, I did place the hand port incision with a midline incision with a right lateral keyhole defect around the umbilicus, this was created with a scalpel, additional dissection down through the subcuticular tissue with electrocautery including the division of the fascia.  Entrance into the peritoneal cavity, I enlarged the peritoneal defect both superiorly and inferiorly adequately to place my hand and then a hand port apparatus was brought to the field,  was then placed in standard fashion.  At this time, pneumoperitoneum was reestablished.  I was able to palpate around the abdomen and placed a 11-mm trocar in the left lateral abdominal wall as well as in the right lateral abdominal wall.  At this time, began the dissection there was significant amount of adhesions in the right lower quadrant.  These were divided using combination of sharp and inferior dissection as well as Enseal ligation and electrocautery.  Upon freeing this, I was able to free the terminal ileum and  worked my way up proximally along the terminal ileum and the small bowel.  Upon having adequate length at this time I did create a window in the small bowel mesentery with electrocautery.  Through which the articulating Endo-GIA 45 stapler was utilized to divide the small bowel.  At this time, I used the Enseal ligation device to ligate the small bowel mesentery distally along the terminal ileum to drain the cecum and the mesoappendix and I continued this dissection of the ascending colon.  For angulation, I did opt to place a fourth 11-mm trocar in the suprapubic region, again placed under direct visualization for additional angulation with the camera.  At this time, I continued the dissection up to the hepatic flexure due to difficulties with exposure and I then identified the midportion of the transverse colon, created a window in the mesentery of the transverse mesocolon and then continued the dissection proximal to the hepatic flexure, again using the Enseal device.  This dissection was extremely slow due to the amount of intra-abdominal fat and adipose tissue encountered including a large sheet of omental fat which did obscure the majority of the dissection at times.  Upon freeing the proximal portion of the colon, I then turned my attention and continued dissection along the splenic flexure down the descending colon to the sigmoid colon.  This again was carried out using the Enseal ligation, several adhesions around the splenic flexure were taken down with combination of Enseal ligation as well as electrocautery.  At this time, I was quite pleased with the length of the specimen and the appearance of the sigmoid colon, I divided the sigmoid colon proximally in the midportion of the sigmoid colon again dividing this with a reloaded the Endo-GIA stapler.  At this point, the specimen was free, it was placed in the back table and sent as permanent specimen to Pathology, this included  the terminal ileum, the appendix, and the colon up to the mid sigmoid colon.  At this time, I inspected the abdominal cavity.  There was some oozing coming from the left upper quadrant, although the spleen itself was intact.  I packed several lap sponges in order to attempt for hemostasis and they continued with the anastomoses.  A 3-0 silk was utilized as a pexing suture to pex the small bowel in the sigmoid colon adjacent to each other ensuring that the small bowel was not twisted upon itself.  I then created an enterotomy and colotomy with electrocautery and then inserted the reload of the Endo-GIA 45 stapler upon creating the stapled anastomoses.  The remaining defect was reapproximated using simple interrupted 3-0 silks.  I was quite pleased with the appearance and I did also placed a 3-0 silk suture at the apex of the anastomoses.  There is a widely patent anastomotic connection palpable through the bowel wall.  At this time, I turned attention back to the left upper quadrant. At this time, hemostasis did appear adequate, although  there were some raw areas along the mesentery, I did opt to place a piece of Gelfoam wrapped in Surgicel up into this area for some hemostatic packing. Inspection of the remainder abdomen demonstrated excellent hemostasis and no evidence of any abnormalities.  At this time after having irrigated the abdominal cavity with copious amounts of sterile saline, I then turned my attention to closure.  Using a 0 Vicryl suture, the fascial defects were closed at all four 11- mm trocar sites and then a 0-looped Novofil was utilized to reapproximate the fascia at the midline.  Hand port incision in a running continuous fashion with all the sites closed.  The skin edges were reapproximated.  Using skin staples, the skin was washed and dried with moist dry towel.  Betadine was placed over the staple line.  The sterile dressings were placed.  Drapes were removed.   The patient was allowed to come out from general anesthetic and was transferred back to Postanesthetic Care Unit in stable condition.  At the conclusion of the procedure, all instrument, sponge, and needle counts were correct.  The patient tolerated the procedure extremely well.     Tilford Pillar, MD     BZ/MEDQ  D:  10/04/2010  T:  10/05/2010  Job:  [id number]  cc:   Kingsley Callander. Ouida Sills, MD Fax: [phone]  Lionel December, M.D. Fax: [phone]  Electronically Signed by Tilford Pillar MD on 11/01/2010 11:09:14 AM

## 2010-11-09 ENCOUNTER — Encounter (INDEPENDENT_AMBULATORY_CARE_PROVIDER_SITE_OTHER): Payer: Medicare Other | Admitting: Adult Health

## 2010-11-09 ENCOUNTER — Encounter: Payer: Self-pay | Admitting: Adult Health

## 2010-11-09 DIAGNOSIS — E7800 Pure hypercholesterolemia, unspecified: Secondary | ICD-10-CM

## 2010-11-09 DIAGNOSIS — I10 Essential (primary) hypertension: Secondary | ICD-10-CM

## 2010-11-09 DIAGNOSIS — I4891 Unspecified atrial fibrillation: Secondary | ICD-10-CM

## 2010-11-10 ENCOUNTER — Encounter: Payer: Self-pay | Admitting: Adult Health

## 2010-11-14 NOTE — Assessment & Plan Note (Signed)
Summary: [facility]   Primary Provider:  Dr Dennis Ray   History of Present Illness: [name] is a 70 y/o CM we are following with known history of CAD,Afib (not a coumadin candidate secondary to GIB), hypercholesterolemia. He is recently discharged from [facility] after subtotal colectomy secondary to ulcerative colitis, with abdominal wound infection.  We saw him on preoperative consultation and followed him during his perioperative period.  He did have some post-operative afib with RVR requiring cardiazem drip but is now only on metoprolol secondary to bradycardia on the cardiazem. He has seen Dr. Ouida Ray who has taken him off norvasc since disharge and increased clonidine to 0.2mg  two times a day.    He has been feeling "ok" from a cardiac standpoint. Still has not gotten his strength back compeltely. He continues sore over wound site where infection was evident but has had no bleeding, sycope, or complaints of racing heart rate. He appears to have some element of dementia with disjointed phrases and forgetfulness.  Current Medications (verified): 1)  Asacol 400 Mg Tbec (Mesalamine) .... Take 3  Tablets  By Mouth 2 X A Day 2)  Lasix 40 Mg Tabs (Furosemide) .... Take 1 Tablet By Mouth Once A Day 3)  Simvastatin 20 Mg Tabs (Simvastatin) .... Take One Tablet By Mouth Daily At Bedtime 4)  Metoprolol Tartrate 50 Mg Tabs (Metoprolol Tartrate) .... Take 1 Tablet By Mouth Two Times A Day 5)  Lisinopril 40 Mg Tabs (Lisinopril) .... Take One Tablet By Mouth Daily 6)  Catapres 0.2 Mg Tabs (Clonidine Hcl) .... Take 1 Tablet By Mouth Two Times A Day 7)  Potassium Chloride Crys Cr 20 Meq Cr-Tabs (Potassium Chloride Crys Cr) .[name].. 1 Tab By Mouth Once Daily 8)  Nitrostat 0.4 Mg Subl (Nitroglycerin) .[name].. 1 Tablet Under Tongue At Onset of Chest Pain; You May Repeat Every 5 Minutes For Up To 3 Doses. 9)  Fenofibrate 160 Mg Tabs (Fenofibrate) .... Take 1 Tablet By Mouth Once A Day 10)  Amlodipine Besylate 10 Mg Tabs  (Amlodipine Besylate) .... Take One Tablet By Mouth Daily 11)  Zolpidem Tartrate 10 Mg Tabs (Zolpidem Tartrate) .... Take 1 Tablet By Mouth Once A Day As Needed  Allergies (verified): No Known Drug Allergies  Past History:  Past Surgical History: Stent Placement-Cardiac  DESCRIPTION OF PROCEDURE:  The procedure was performed from the right femoral artery.  An indwelling 6-French sheath was exchanged for a 7-French sheath.  AR4Bright Tip guiding catheter was used to intubate the right coronary artery.The lesion was crossed with a 0.014 Hi-Torque floppy wire.  A 2.5 mm x 20.0 mm length______ balloon was used to predilate the lesion.  We then stented the lesion with a 13.0 mm length x 3.39mm BX velocity stent.  This was post-dilated with a 3.43m balloon.  There was a reduction in the stenosis from 80% down to 0%, withmarkedimprovement in the appearance of the vessel. The intraluminal thrombus was no longer present.  The stent was post-dilated using a 3.5 mm x 10.0 mm length______balloon.  Two dilatations were performed.  The procedure was completed.  The guiding catheter was removed.  The femoral sheath was sewn into place.  He was taken to the holding area in satisfactory clinical condition.Heparin was given at the beginning of the case.  ReoPro was also administeredduring the procedure.  The ACT was checked and was in the appropriate range.CONCLUSION:  Successful percutaneous intervention with stenting of the distal portion of the mid-right coronary artery. colonoscopy Subtotal  colectomy secondary to ulcerative colitis Wound infection debridement  Review of Systems       Abdominal soreness at wound infection site. Fatigue. All other systems have been reviewed and are negative unless stated above.   Vital Signs:  Patient profile:   70 year old male Height:      77 inches Weight:      277 pounds O2 Sat:      94 % on Room air Pulse rate:   66 / minute BP standing:   107 / 65  (left  arm)  Vitals Entered By: Teressa Lower RN (November 09, 2010 11:53 AM)  O2 Flow:  Room air  Physical Exam  General:  Well developed, well nourished, in no acute distress.normal appearance.   Lungs:  Clear bilaterally to auscultation and percussion. Heart:  Irregular rhythum without MRG.  No carotid bruits, pulses are palpable.   Abdomen:  Wound to midabomin with dressing.  No evidence of bleeding or drainage on the dressing. Msk:  Back normal, normal gait. Muscle strength and tone normal. Pulses:  pulses normal in all 4 extremities Extremities:  No clubbing or cyanosis. Neurologic:  Alert and oriented x 3. Skin:  Pale and warm Psych:  easily distracted.     EKG  Procedure date:  11/09/2010  Findings:      Atrial fibrillation with a controlled ventricular response rate of: 64 bpm  Impression & Recommendations:  Problem # 1:  ATRIAL FIBRILLATION (ICD-427.31) Rate is well controlled on metoprolol.  He is not a coumadin candidate secondary to GIB, chronic ulcerative colitis.  He has had a recent subtotal colectomy for this, but he continues have a high risk for GIB, and risks of CVA are determined to be less than that of a recurrent bleed at this time.  He is stable at present.  Will see him in 6 months unless he becomes symptomatic. The following medications were removed from the medication list:    Aspirin Ec Low Dose 81 Mg Tbec (Aspirin) .[name]... Take 1 tablet by mouth once a day    Warfarin Sodium 5 Mg Tabs (Warfarin sodium) .[name]... 1 once daily as directed His updated medication list for this problem includes:    Metoprolol Tartrate 50 Mg Tabs (Metoprolol tartrate) .[name]... Take 1 tablet by mouth two times a day  Problem # 2:  Hx of HYPERTENSION (ICD-401.9) Low normal at this time.  Will continue to monitor.  May need to decrease clonidine dose to 0.1 mg two times a day as before if he becomes dizzy or orthostatic.   The following medications were removed from the medication list:     Aspirin Ec Low Dose 81 Mg Tbec (Aspirin) .[name]... Take 1 tablet by mouth once a day His updated medication list for this problem includes:    Lasix 40 Mg Tabs (Furosemide) .[name]... Take 1 tablet by mouth once a day    Metoprolol Tartrate 50 Mg Tabs (Metoprolol tartrate) .[name]... Take 1 tablet by mouth two times a day    Lisinopril 40 Mg Tabs (Lisinopril) .[name]... Take one tablet by mouth daily    Catapres 0.2 Mg Tabs (Clonidine hcl) .[name]... Take 1 tablet by mouth two times a day    Amlodipine Besylate 10 Mg Tabs (Amlodipine besylate) .[name]... Take one tablet by mouth daily  Problem # 3:  Hx of CAD (ICD-414.00) He denies any cardiac symptoms at this time.  Continue to follow Q 6 months. The following medications were removed from the medication list:  Aspirin Ec Low Dose 81 Mg Tbec (Aspirin) .[name]... Take 1 tablet by mouth once a day    Warfarin Sodium 5 Mg Tabs (Warfarin sodium) .[name]... 1 once daily as directed His updated medication list for this problem includes:    Metoprolol Tartrate 50 Mg Tabs (Metoprolol tartrate) .[name]... Take 1 tablet by mouth two times a day    Lisinopril 40 Mg Tabs (Lisinopril) .[name]... Take one tablet by mouth daily    Nitrostat 0.4 Mg Subl (Nitroglycerin) .[name]... 1 tablet under tongue at onset of chest pain; you may repeat every 5 minutes for up to 3 doses.    Amlodipine Besylate 10 Mg Tabs (Amlodipine besylate) .[name]... Take one tablet by mouth daily  Patient Instructions: 1)  Your physician recommends that you schedule a follow-up appointment in: 6 months 2)  Your physician recommends that you continue on your current medications as directed. Please refer to the Current Medication list given to you today.

## 2010-11-24 NOTE — Discharge Summary (Signed)
  NAME:  Dennis Ray, Dennis Ray                 ACCOUNT NO.:  [id number]  MEDICAL RECORD NO.:  [id number]          PATIENT TYPE:  INP  LOCATION:  A311                          FACILITY:  [facility]  PHYSICIAN:  Tilford Pillar, MD      DATE OF BIRTH:  1940-11-10  DATE OF ADMISSION:  10/01/2010 DATE OF DISCHARGE:  01/24/2012LH                              DISCHARGE SUMMARY   ADMISSION DIAGNOSIS:  Lower gastrointestinal bleeding.  POSTOPERATIVE DIAGNOSES: 1. Status post laparoscopic hand-assisted subtotal colectomy for lower     gastrointestinal bleeding and history of ulcerative colitis. 2. History of coronary artery disease. 3. Chronic atrial fibrillation. 4. Chronic low back pain. 5. Hypertension. 6. Hyperlipidemia.  PROCEDURE:  Laparoscopic hand-assisted subtotal colectomy with ileocolonic anastomosis.  DISPOSITION:  Home.  BRIEF HISTORY AND PHYSICAL:  Please see the admission history and physical for a complete H and P.  The patient was a 70 year old male with a history of ulcerative colitis who presented to [facility] for GI bleeding.  He was admitted for continued management and intervention.  HOSPITAL COURSE:  The patient was admitted on October 01, 2010.  He was taken to the operating room on October 04, 2010, and underwent laparoscopic hand-assisted subtotal colectomy as mentioned above.  He tolerated this well.  He spent a brief period in the Postanesthetic Care Unit, initially monitored secondarily to age in the Intensive Care Unit, and then transferred back to the regular floor.  Postoperative course was somewhat slow secondarily to the development of wound infection and postoperative ileus.  He continued to improve and the patient was discharged home on October 10, 2010.  DISCHARGE INSTRUCTIONS:  She was instructed on continued wound care.  He is to increase his diet as tolerated.  He is return to see me in the office in 1 week.  DISCHARGE MEDICATIONS:  Please see the  discharge medication reconciliation sheet for all discharge medications.     Tilford Pillar, MD     BZ/MEDQ  D:  11/23/2010  T:  11/24/2010  Job:  [id number]  Electronically Signed by Tilford Pillar MD on 11/24/2010 11:23:13 AM

## 2010-11-27 LAB — BASIC METABOLIC PANEL
CO2: 30 mEq/L (ref 19–32)
Calcium: 8.6 mg/dL (ref 8.4–10.5)
Glucose, Bld: 113 mg/dL — ABNORMAL HIGH (ref 70–99)
Sodium: 138 mEq/L (ref 135–145)

## 2010-11-27 LAB — DIFFERENTIAL
Basophils Absolute: 0.1 10*3/uL (ref 0.0–0.1)
Basophils Absolute: 0.1 10*3/uL (ref 0.0–0.1)
Basophils Relative: 1 % (ref 0–1)
Eosinophils Absolute: 0.3 10*3/uL (ref 0.0–0.7)
Eosinophils Relative: 1 % (ref 0–5)
Eosinophils Relative: 4 % (ref 0–5)
Lymphocytes Relative: 12 % (ref 12–46)
Lymphocytes Relative: 15 % (ref 12–46)
Lymphs Abs: 1.1 10*3/uL (ref 0.7–4.0)
Lymphs Abs: 1.1 10*3/uL (ref 0.7–4.0)
Monocytes Absolute: 0.9 10*3/uL (ref 0.1–1.0)
Monocytes Relative: 9 % (ref 3–12)
Neutro Abs: 5.7 10*3/uL (ref 1.7–7.7)
Neutro Abs: 6.5 10*3/uL (ref 1.7–7.7)
Neutrophils Relative %: 65 % (ref 43–77)
Neutrophils Relative %: 73 % (ref 43–77)
Neutrophils Relative %: 77 % (ref 43–77)

## 2010-11-27 LAB — COMPREHENSIVE METABOLIC PANEL
CO2: 26 mEq/L (ref 19–32)
Calcium: 8.8 mg/dL (ref 8.4–10.5)
Chloride: 104 mEq/L (ref 96–112)
GFR calc Af Amer: >60 mL/min (ref >60)
Glucose, Bld: 105 mg/dL — ABNORMAL HIGH (ref 70–99)
Potassium: 4.4 mEq/L (ref 3.5–5.1)
Sodium: 138 mEq/L (ref 135–145)

## 2010-11-27 LAB — CBC
HCT: 23.8 % — ABNORMAL LOW (ref 39.0–52.0)
Hemoglobin: 7.1 g/dL — ABNORMAL LOW (ref 13.0–17.0)
MCH: 22.6 pg — ABNORMAL LOW (ref 26.0–34.0)
MCHC: 30.3 g/dL (ref 30.0–36.0)
MCV: 73.4 fL — ABNORMAL LOW (ref 78.0–100.0)
MCV: 74.5 fL — ABNORMAL LOW (ref 78.0–100.0)
Platelets: 282 10*3/uL (ref 150–400)
Platelets: 293 10*3/uL (ref 150–400)
RBC: 3.3 MIL/uL — ABNORMAL LOW (ref 4.22–5.81)
RBC: 3.72 MIL/uL — ABNORMAL LOW (ref 4.22–5.81)
RDW: 16.5 % — ABNORMAL HIGH (ref 11.5–15.5)
RDW: 16.7 % — ABNORMAL HIGH (ref 11.5–15.5)
WBC: 7.8 10*3/uL (ref 4.0–10.5)

## 2010-11-27 LAB — CROSSMATCH
ABO/RH(D): A POS
Antibody Screen: NEGATIVE
Unit division: 0

## 2010-11-27 LAB — HEMOGLOBIN AND HEMATOCRIT, BLOOD
HCT: 26.6 % — ABNORMAL LOW (ref 39.0–52.0)
HCT: 28.8 % — ABNORMAL LOW (ref 39.0–52.0)
Hemoglobin: 8.2 g/dL — ABNORMAL LOW (ref 13.0–17.0)
Hemoglobin: 8.8 g/dL — ABNORMAL LOW (ref 13.0–17.0)

## 2010-11-27 LAB — PROTIME-INR
INR: 2.06 — ABNORMAL HIGH (ref 0.00–1.49)
INR: 2.76 — ABNORMAL HIGH (ref 0.00–1.49)
Prothrombin Time: 21.1 seconds — ABNORMAL HIGH (ref 11.6–15.2)
Prothrombin Time: 23.4 seconds — ABNORMAL HIGH (ref 11.6–15.2)

## 2010-11-27 LAB — ABO/RH: ABO/RH(D): A POS

## 2011-01-18 ENCOUNTER — Encounter: Payer: Self-pay | Admitting: Cardiology

## 2011-01-18 ENCOUNTER — Ambulatory Visit (INDEPENDENT_AMBULATORY_CARE_PROVIDER_SITE_OTHER): Payer: Medicare Other | Admitting: Cardiology

## 2011-01-18 ENCOUNTER — Ambulatory Visit: Payer: Medicare Other | Admitting: Cardiology

## 2011-01-18 DIAGNOSIS — I10 Essential (primary) hypertension: Secondary | ICD-10-CM

## 2011-01-18 DIAGNOSIS — I4892 Unspecified atrial flutter: Secondary | ICD-10-CM

## 2011-01-18 DIAGNOSIS — I2510 Atherosclerotic heart disease of native coronary artery without angina pectoris: Secondary | ICD-10-CM | POA: Insufficient documentation

## 2011-01-18 DIAGNOSIS — F1011 Alcohol abuse, in remission: Secondary | ICD-10-CM

## 2011-01-18 DIAGNOSIS — I4891 Unspecified atrial fibrillation: Secondary | ICD-10-CM

## 2011-01-18 DIAGNOSIS — K219 Gastro-esophageal reflux disease without esophagitis: Secondary | ICD-10-CM

## 2011-01-18 DIAGNOSIS — K5190 Ulcerative colitis, unspecified, without complications: Secondary | ICD-10-CM | POA: Insufficient documentation

## 2011-01-18 DIAGNOSIS — E785 Hyperlipidemia, unspecified: Secondary | ICD-10-CM

## 2011-01-18 MED ORDER — WARFARIN SODIUM 5 MG PO TABS: 2.5000 mg | ORAL_TABLET | Freq: Every day | ORAL | Status: DC

## 2011-01-18 MED ORDER — AMLODIPINE BESYLATE 2.5 MG PO TABS: 2.5000 mg | ORAL_TABLET | Freq: Every day | ORAL | Status: DC

## 2011-01-18 MED ORDER — TERAZOSIN HCL 10 MG PO CAPS: 10.0000 mg | ORAL_CAPSULE | ORAL | Status: DC

## 2011-01-18 NOTE — Assessment & Plan Note (Addendum)
Blood pressure control slightly suboptimal at this visit.  Patient does not have a functional sphygmomanometer at home.  Amlodipine may be contributing to pedal edema, which he finds somewhat troubling.  He apparently had difficult-to-control blood pressure in the past and was treated with minoxidil at one point.  Amlodipine will be reduced to 2.5 mg q.d and Hytrin added to his regime with gradual upward titration.  His dose of beta blocker cannot be increased nor can verapamil or diltiazem be added, as these would likely cause significant bradycardia.  I will continue to monitor pressure closely until adequate control has been achieved and plan a return office visit in one month.

## 2011-01-18 NOTE — Progress Notes (Signed)
HPI : [name] is a very pleasant gentleman previously followed by Dr. Eden Emms for paroxysmal atrial arrhythmias.  Onset was at least as far back as 2008.  Records are fragmentary, and it is difficult to determine the exact pattern of his arrhythmias.  Cardioversion and possible ablation was planned at one point, but management from a an electrophysiologic standpoint was complicated by chronic lower GI blood loss compromising our ability to treat with anticoagulants.  Patient recently underwent a subtotal colectomy, which has eliminated bleeding and allowed serum hemoglobin to recover to near normal.  All GI symptoms have resolved, and the patient will not be seen by Dr. Karilyn Cota for another year.  Current Outpatient Prescriptions on File Prior to Visit  Medication Sig Dispense Refill  . cloNIDine (CATAPRES) 0.1 MG tablet Take 0.1 mg by mouth 2 (two) times daily.        . fenofibrate 160 MG tablet Take 160 mg by mouth daily.        . furosemide (LASIX) 40 MG tablet Take 40 mg by mouth daily.        [name] lisinopril (PRINIVIL,ZESTRIL) 40 MG tablet Take 40 mg by mouth daily.        . mesalamine (ASACOL) 400 MG EC tablet Take 400 mg by mouth. Take 3 tablets 2 times a day.       . metoprolol (LOPRESSOR) 50 MG tablet Take 50 mg by mouth 2 (two) times daily.        . nitroGLYCERIN (NITROSTAT) 0.4 MG SL tablet Place 0.4 mg under the tongue every 5 (five) minutes as needed. Take 1 tablet under the tongue at onset of chest pain; you may repeat every 5 minutes for up to 3 doses.       . potassium chloride SA (K-DUR,KLOR-CON) 20 MEQ tablet Take 20 mEq by mouth daily.        . simvastatin (ZOCOR) 20 MG tablet Take 20 mg by mouth at bedtime.        [name] DISCONTD: amLODipine (NORVASC) 10 MG tablet Take 10 mg by mouth daily.        [name] DISCONTD: warfarin (COUMADIN) 5 MG tablet Take 5 mg by mouth daily. As directed by Anticoagulation clinic.       [name] DISCONTD: aspirin 81 MG tablet Take 81 mg by mouth daily.           No Known  Allergies    Past medical history, social history, and family history reviewed and updated.  ROS: Denies chest discomfort, dyspnea, orthopnea, PND, headache, cough, sputum production.  He does have mild to moderate chronic dependent edema for which she has tried compression stockings without benefit.  PHYSICAL EXAM: BP 145/84  Pulse 72  Ht 6\' 5"  (1.956 m)  Wt 260 lb (117.935 kg)  BMI 30.83 kg/m2  SpO2 94%  General-Well developed; no acute distress Body habitus-proportionate weight and height Neck-No JVD; no carotid bruits Lungs-clear lung fields; resonant to percussion Cardiovascular-normal PMI; normal S1 and S2; irregular rhythm Abdomen-normal bowel sounds; soft and non-tender without masses or organomegaly Musculoskeletal-No deformities, no cyanosis or clubbing Neurologic-Normal cranial nerves; symmetric strength and tone Skin-Warm, no significant lesions Extremities-distal pulses intact; 1-2+ pitting pretibial edema  EKG: Atrial fibrillation; heart rate of 73 bpm;prior inferior myocardial infarction; delayed R-wave progression; LVH with repolarization abnormality; no previous tracing for comparison.  ASSESSMENT AND PLAN:

## 2011-01-18 NOTE — Assessment & Plan Note (Addendum)
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  I've given him permission to continue using a chain saw, as long as he is careful.

## 2011-01-18 NOTE — Patient Instructions (Signed)
Your physician recommends that you schedule a follow-up appointment in: 1 month Your physician has recommended you make the following change in your medication: decrease norvasc to 2.5mg  daily, hytrin 5mg  daily x1 week then increase to 10mg  daily , restart warfarin at 2.5mg  daily we will recheck your iINR in 10days Your physician has requested that you regularly monitor and record your blood pressure readings at home. Please use the same machine at the same time of day to check your readings and record them to bring to your follow-up visit. Please bring blood pressure diary to office visit Low salt diet: handout

## 2011-01-18 NOTE — Assessment & Plan Note (Signed)
Dennis Ray has improved dramatically since partial colectomy and currently appears to be in remission with respect to ulcerative colitis.  He has significant risk for thromboembolism related to atrial fibrillation, most notably hypertension, age and vascular disease.  We will restart warfarin, monitoring him closely clinically as well as obtaining serial stool Hemoccult tests and CBCs.

## 2011-01-22 ENCOUNTER — Encounter: Payer: Self-pay | Admitting: Cardiology

## 2011-01-29 ENCOUNTER — Encounter: Payer: Medicare Other | Admitting: *Deleted

## 2011-01-29 ENCOUNTER — Ambulatory Visit (INDEPENDENT_AMBULATORY_CARE_PROVIDER_SITE_OTHER): Payer: Medicare Other | Admitting: *Deleted

## 2011-01-29 DIAGNOSIS — Z7901 Long term (current) use of anticoagulants: Secondary | ICD-10-CM

## 2011-01-29 DIAGNOSIS — I4891 Unspecified atrial fibrillation: Secondary | ICD-10-CM

## 2011-01-29 LAB — POCT INR: INR: 1.5

## 2011-01-30 NOTE — Assessment & Plan Note (Signed)
NAME:  Dennis Ray, Dennis Ray                  CHART#:  [id number]   DATE:  03/11/2007                       DOB:  1941-04-28   CHIEF COMPLAINT:  History of colonic dysplasia on prior incomplete  colonoscopy.   The patient is a pleasant 70 year old Caucasian male, retired [employer] employee who has a history of ulcerative colitis  felt to be in remission. He recently was found to have three Hemoccult  cards come back positive. Colonoscopy was attempted on December 04, 2006.  Colonoscopy was incomplete--relative redundancy in the colon and poor  prep precluded complete examination. On colonoscopy, was found to have a  sparing of the normal appearing rectum and sigmoid with chronic  inflammatory changes in the proximal colon. Distribution of inflammatory  changes raised the question of whether or not he may have Crohn's  colitis and not UC. Biopsies of the area of diffusely abnormal appearing  colon came back as a adenomatous polyp. This precipitated further  scrutiny as I did not biopsy any polyps. Extensive discussion with Dr.  Luisa Hart, the pathologist in [place], he and other pathologist  reviewed the slides and particularly in the transverse colon samples,  this was interpreted as being adenomatous tissue or dysplasia by  definition, which raises a red flag in this setting. It was recommended  to the patient that he come back and have another colonoscopy. He has  great concerns about insurance not covering the procedure. In fact, he  saw Dr.  Ouida Sills and pursued a second opinion with Dr.  Claudette Head down  in [place]. Apparently, he never actually saw Dr.  Russella Dar, but  communicated to him indirectly through his nurse and the take home  message he received is there is nothing more that can be done beyond  what I have recommended.   It is notable that this gentleman's prior colonoscopy was back in  January of 2006. However, I was able to have made it all the way around  to  the cecum with the Olympus equipment at that time. He is not passing  any blood per rectum. No melena. He has not had any abdominal pain.  Appetite is well-maintained. No odynophagia, dysphagia or early satiety  reflux symptoms. Weight is down 6 pounds since he was last seen  previously.   PAST MEDICAL HISTORY:  1. Coronary artery disease, status post myocardial infarction December      2000, status post angioplasty stenting.  2. History of hypertension.  3. Gastroesophageal reflux disease.   CURRENT MEDICATIONS:  1. Enteric coated aspirin 81 mg daily.  2. Multivitamin daily.  3. Garlic daily.  4. Asacol two tablets t.i.d.  5. Lorcet plus p.r.n.  6. Catapres 0.1 mg b.i.d.  7. Lasix 40 mg daily.  8. Simvastatin 40 mg at bedtime.  9. Fish oil daily.  10.Metoprolol 50 mg b.i.d.  11.Lisinopril daily.  12.Clonidine b.i.d.   ALLERGIES:  No known drug allergies.   FAMILY HISTORY:  Mother succumbed to some type of intraabdominal cancer.  Father was burned to death in a fire.   SOCIAL HISTORY:  The patient has been married for 4 years. He has two  children. He is raising a 35 year old granddaughter. He is retired from  [employer]. Rarely consume alcohol. No tobacco.   REVIEW OF SYSTEMS:  No recent chest pain, dyspnea on exertion. No fever  or chills. Otherwise, as in History of Present Illness.   PHYSICAL EXAMINATION:  A pleasant 70 year old gentleman, resting  comfortably. Weight is 292, height is 5 feet, 10 inches, temperature is  97.6, blood pressure 132/84. Pulse 64.  SKIN: Warm and dry. Suntanned.  CHEST: Lungs are clear to auscultation.  HEART: Regular rate and rhythm without murmur, gallop or rub.  ABDOMEN: Nondistended, positive bowel sounds. Soft and nontender without  appreciable mass or organomegaly.  EXTREMITIES: Have no edema.  RECTAL: Deferred to time of colonoscopy.   IMPRESSION:  The patient is a 70 year old gentleman with a history of   inflammatory bowel disease, IE-ulcerative colitis. He was found to  dysplasia on biopsies at the time of his colonoscopy in March of this  year. Unfortunately, I was unable to make it around to the cecum. This  is probably in part due to his tall stature and the fact that he has a  long lax colon. He was Hemoccult positive. His upstream colon has not  been seen recently. I have strongly recommended and have recommended  previously and again today that a second attempt be made at complete  examination of his colon via colonoscopy and numerous segmental biopsies  be taken. I told him that either I could do it or would be happy for any  other gastroenterologist to perform this procedure. He wants me to make  the attempt, but is most concerned about insurance coverage. I share his  concern in this regard and told him that I would go to bat for him since  it was an incomplete examination and abnormalities were found that the  prior colonoscopy would make a case for his third party payers to  provide a benefit for second attempt. I told him that I could not  guarantee him even this time that I can make it all the way around.  There is a chance that the examination will not be complete once again.  There is certainly also the possibility of his insurance company  ultimately will not pay for the second attempt. Even if this is the  case, it is in the interest of his health to pursue complete examination  of his colon and obtain multiple segmental biopsies. If high-grade  dysplasia is seen, then the avenue of intervention would be a total  proctocolectomy with possible ileoanal pouch anastomosis. However, we  are not at that point as of yet. Hopefully, we will hear back from his  insurance company in the near  future and will tentatively plan to perform repeat colonoscopy very  soon. Further recommendations to follow.       Jonathon Bellows, M.D. Electronically Signed     RMR/MEDQ  D:   03/11/2007  T:  03/11/2007  Job:  [id number]   cc:   Kingsley Callander. Ouida Sills, MD

## 2011-01-30 NOTE — Op Note (Signed)
NAME:  Dennis Ray, Dennis Ray                 ACCOUNT NO.:  [id number]   MEDICAL RECORD NO.:  [id number]          PATIENT TYPE:  AMB   LOCATION:  DAY                           FACILITY:  [facility]   PHYSICIAN:  R. Roetta Sessions, M.D. DATE OF BIRTH:  03-27-1941   DATE OF PROCEDURE:  04/02/2007  DATE OF DISCHARGE:                               OPERATIVE REPORT   PROCEDURE:  Incomplete colonoscopy, segmental biopsies.   INDICATIONS FOR PROCEDURE:  A 70 year old Caucasian male a history of  UC/indeterminate colitis underwent a colonoscopy on 12/04/06.  He had a  long _tortuous and redundant colon, prep was poor, was unable to reach  the cecum despite my best efforts.  Segmental biopsies were obtained  (this was done for Hemoccult positive stool) and biopsies revealed  adenomatous tissue in random biopsies on what appeared to be areas of  mucosa that were diffusely inflamed.  There was no focal adenomatous  change.  This equivalent to dysplasia and warranted further evaluation.  I have explained to Dennis Ray by letter and on the telephone the  importance of coming back for a repeat colonoscopy with a good prep.  He  has put this off citing the third party payer concerns and wanted a  second opinion.  He sought out a second opinion down at [facility] and  communicated with Dennis Ray, who basically had no further  recommendations.  We have gone to bat for him and tentatively got  insurance to approve a second attempt at colonoscopy since the first was  incomplete and abnormalities were found.   This gentleman has put the follow-up exam off until now.  I have  explaining there is no guarantees we would make it to the cecum today  and otherwise the usual risks, benefits and alternatives have been  reviewed, questions were answered, he is agreeable.   Please see documentation in the medical record.   Please note that this gentleman told me that he failed to comply with  the written and verbal  instructions for the prep yesterday and consumed  a bacon and egg biscuit.   PROCEDURE NOTE:  O2 saturation, blood pressure, pulse and respirations  were monitor throughout the entire procedure.   CONSCIOUS SEDATION:  Versed 4 mg IV, Demerol 75 mg IV in divided doses.   INSTRUMENT:  Pentax video chip 547 diagnostic scope.   FINDINGS:  Digital exam revealed no abnormalities.   ENDOSCOPIC FINDINGS:  Unfortunately his prep was poor, he had granular  liquid stool in the rectum and going up into the colon and he also had  quite a bit of tenacious stool tinged mucus which was very difficult to  wash away.  The rectum was dilated and examination of rectal mucosa  including retroflex view anal verge demonstrated no gross abnormalities.  Colon:  Colonic mucosa was surveyed from the rectosigmoid junction well  into the more proximal colon.  His colon was dilated, redundant and very  poorly prepped (please see multiple photographs).  As the sigmoid was  approached and the scope was advanced,  there was a  pale appearing  appearance of the mucosa.  There was some pitting in the mucosa and some  loss of the normal vascular appearance but no out and out erosions or  ulcerations.  Some areas appeared pale.  I got to the most proximal  extent of the colon and was almost as if there was a submucosal bulge  into the lumen around the flexure, but I could never get to it to get  around it to complete the examination.  There was redundancy and  recurrent looping.  I changed his position multiple times and exhausted  all efforts of external abdominal pressure to help stent the colon to  make it around.  After some time all my efforts were felt to be futile  from the most proximal extent of colon where eight segmental biopsies  were taken.  Multiple biopsies were taken segmentally from the more  proximal extent of the colon all the way down into the rectum for  pathologist review.  The patient tolerated the  lengthy procedure well  and was reacted in endoscopy.   IMPRESSION:  1. Poor prep (patient noncompliance) significantly compromising exam.  2. Long redundant colon in the setting of poor prep again precluded      complete examination.   This clinical setting is quite frustrating.  This gentleman was  originally sent back to me because of Hemoccult positive stool.  His  colon could easily be harboring high-grade dysplasia.  Moreover, his  right colon and cecum have not been seen.   RECOMMENDATIONS:  1. I recommend to Mr .Ray he make plans to go to [facility] for further evaluation of his colon.  Certainly at      a minimum he needs a tertiary attempt at full colonoscopy in      hopefully the setting of a good prep.  2. However, if his biopsies revealed a high-grade pathology, he may      simply just need to be sent to a colorectal surgeon.  Further      recommendations to follow in the very near future.      Dennis Ray, M.D.  Electronically Signed     RMR/MEDQ  D:  04/02/2007  T:  04/03/2007  Job:  [id number]   cc:   Dennis Ray. Dennis Sills, MD  Fax: [phone]

## 2011-02-02 NOTE — Assessment & Plan Note (Signed)
HEALTHCARE                       [facility] CARDIOLOGY OFFICE NOTE   NAME:Ray, [name]                        MRN:          [id number]  DATE:11/06/2006                            DOB:          [birth year]    Dennis Ray is seen today in followup. I saw him about a week ago in  [place] and he was in new atrial flutter. He has had longstanding  palpitations. He feels his heartbeat gets irregular every 6-8 weeks. We  started him on Coumadin last week. His INR today was therapeutic at 2.4.   In general, he does fairly well. He has a distant history of coronary  disease with a dense inferior wall infarction. He had a Myoview last  year that had no ischemia. His EF was estimated at 59% but given the  extent of his inferior wall defect I doubt that it is normal. I had a  fairly long discussion with him today about the DETERMINE trial. I think  we will try to refer him for a cardiac MRI and see if he is a candidate  for a defibrillator and quantitate his EF.   Given the patient's coronary disease, I have been somewhat reluctant to  start him on outpatient antiarrhythmics.   Similarly today, he appears to be in sinus rhythm. We will check an EKG.  I had a long discussion with Khoury about the risk of antiarrhythmics and  also long-term Coumadin for paroxysmal atrial flutter. He and I both  agree that for the time being he would prefer not to be on either  Coumadin or antiarrhythmics.   We will proceed with the DETERMINE trial to quantitate his EF, however,  since it is undoubtedly not 59%, I was encouraged that it was not  ischemic, however.   CURRENT MEDICATIONS:  1. Clonidine 0.1 b.i.d.  2. Lopressor 100 a day.  3. Minoxidil 20 a day. He will talk to Dr. Ouida Sills about titrating this      medicine off.  4. Simvastatin 40 a day.  5. Nifedipine 60 a day.  6. Asacol.  7. Lasix 40.  8. Enalapril 20 a day.  9. Baby aspirin.   PHYSICAL EXAMINATION:  VITAL  SIGNS:  His rhythm is regular at a rate of  70. He appears to be in sinus rhythm. Blood pressure is 112/62.  HEENT:  Normal.  LUNGS:  Clear.  NECK:  Carotids normal.  HEART:  There is an S1, S2. Normal heart sounds.  ABDOMEN:  Benign.  EXTREMITIES:  Lower extremities intact pulses, no edema.   IMPRESSION:  Paroxysmal atrial flutter, probable conversion, fairly  infrequent symptoms. Continue beta blocker therapy and aspirin. We will  stop Coumadin. If the patient continues to have symptoms probably the  only drug I would feel safe starting as an outpatient would be  amiodarone. For the time being, we will stop his Coumadin.   He will have his MRI scheduled at [facility] to quantitate his EF since 59% is  too generous and also to consider the DETERMINE trial for possible AICD  implantation for patients with EFs greater than  35% and dense scar.   We will follow up his lipid and liver profile in 6 months. In April  2006, his LDL was in a good range at 77.   Further recommendations to be based on his clinical course and cardiac  MRI.     Noralyn Pick. Eden Emms, MD, [facility]  Electronically Signed    PCN/MedQ  DD: 11/06/2006  DT: 11/06/2006  Job #: [id number]

## 2011-02-02 NOTE — Assessment & Plan Note (Signed)
[facility]                            CARDIOLOGY OFFICE NOTE   NAME:Dennis Ray                        MRN:          [id number]  DATE:11/01/2006                            DOB:          [birth year]    Dennis Ray returns for followup.  He has had a distant history of IMI in  2001.   He had a low-risk Myoview in August of this year with a dense inferior  wall infarction, no ischemia.   His ejection fraction has been in the 59% range, although Ray think this  is generous, given the size of his defect.  He has not had any  significant chest pain.   The patient has been under some stress.  His son has a problem with  prescription drugs and just got laid off.  He continues to live at home.  The patient needs to be more active and lose weight.  His blood pressure  has been under fairly good control.  He has not had any significant  angina.   REVIEW OF SYSTEMS:  Remarkable for occasional palpitations.  He has not  had any significant syncope, PND or orthopnea.  There has been no  previous history of A-fib.   MEDICATIONS INCLUDE:  1. Clonidine 0.1 mg b.Ray.d.  2. Lopressor 100 a day.  3. Minoxidil 20 a day.  4. Simvastatin 40 a day.  5. Nifedipine 60 a day.  6. Asacol 400 three times a day.  7. Lasix 40 a day.  8. Enalapril 20 a day.  9. Baby aspirin a day.   PHYSICAL EXAMINATION:  Remarkable for being overweight.  His blood  pressure is 140/80.  He has significant arrhythmia.  Ray cannot tell if he  is in A-fib or having frequent PACs.  He will have to have an EKG.  HEENT is otherwise unremarkable.  There are no carotid bruits, no  lymphadenopathy.  Lungs are clear.  There is an S1, S2, normal heart  sounds.  Abdomen is benign.  Lower extremities with intact pulses, no  edema.   IMPRESSION:  Hypertension.  Complex regimen.  The long-term side effects  of minoxidil, including pericardial effusion, are somewhat worrisome.  Ray  would try to get him off  this medicine.  He will follow up with Dr.  Ouida Sills in regards to his blood pressure medications.  He will have an EKG  today.  Hopefully, he is not in atrial fibrillation, as Ray am not sure  Dennis Ray would enjoy being on Coumadin.   He will continue his metoprolol therapy in regards to palpitations.   He does have coronary artery disease.  He has mildly decreased LV  function and is not having angina.  We will continue his aspirin and  beta blocker therapy.   Further recommendations will be based on the results of his EKG.  He may  need CardioNet monitoring to make sure he is not having PAF.   His coronary disease appears stable.  His blood pressure meds need  adjusting by Dr. Ouida Sills.  Ray will see him back in six  months.   ADDENDUM  Dennis Ray did have his EKG today, he was in atrial flutter.  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   We talked to him about the need to be on Coumadin.  We will start him on  5 mg a day, he will have his protime checked up in the office.  He gets  these periods of palpitations from time to time, he thinks it will pass.  Ray told him that Ray would like to see him up in [place] and at least  keep him on Coumadin until Ray can get a better idea of the natural  history of this.  He does appear to have a possible ablatable rhythm.   If this is not transient then he will need to be set up for elective  cardioversion.  If he appears to be going in and out of it Ray will have  to talk to our EP doctors about possible antiarrhythmic therapy.  He has  significant coronary disease and Ray would be a little hesitant to use  some antiarrhythmics on him.   Further recommendations will be based on his return visit to [facility]  next week.     Noralyn Pick. Eden Emms, MD, [facility]  Electronically Signed    PCN/MedQ  DD: 11/01/2006  DT: 11/01/2006  Job #: [id number]   cc:   Kingsley Callander. Ouida Sills, MD

## 2011-02-02 NOTE — Cardiovascular Report (Signed)
[facility]  Patient:    Dennis Ray                           MRN: [id number] Proc. Date: 09/19/99 Adm. Date:  [id number] Attending:  Colon Branch CC:         Gareth Morgan, M.D.             Noralyn Pick. Eden Emms, M.D. [facility]                        Cardiac Catheterization  PROCEDURE PERFORMED:  Coronary arteriography.  INDICATIONS:  Inferior wall MI, four days previously, treated with Retavase.  CORONARY ARTERIOGRAPHY:  Left main coronary artery:  The left main coronary artery was normal.  Left anterior descending artery:  The left anterior descending artery was normal.  Circumflex coronary artery:  The circumflex coronary artery had a 30% lesion in the distal obtuse marginal branch.  Right coronary artery:  The right coronary artery had 20% multiple discrete lesions proximally.  There were 40% multiple discrete lesions in the mid vessel.  The distal vessel at the bend had a 95% ruptured plaque with a minimal amount of clot left.  There was normal TIMI-3 flow down the vessel.  There was a large PDA and two posterolateral branches without disease.  RIGHT ANTERIOR OBLIQUE VENTRICULOGRAPHY:  The RAO ventriculography revealed inferoapical hypokinesis with an EF of 50%.  There was no MR.  No gradient across the aortic valve.  Aortic pressure was 158/87.  LV pressure was in the range of  158/24 pre A wave.  IMPRESSION:  The patient has had successful lytic therapy with fairly well preserved left ventricular function.  He has a tight residual lesion in the distal right coronary artery.  He will be referred for stenting of the distal right coronary artery by Dr. Riley Kill. DD:  09/19/99 TD:  09/19/99 Job: [id number]

## 2011-02-02 NOTE — Assessment & Plan Note (Signed)
[facility]                            CARDIOLOGY OFFICE NOTE   NAME:Dennis Ray, Dennis Ray                        MRN:          [id number]  DATE:11/01/2006                            DOB:          1941/01/30    ADDENDUM   Dennis Ray did have his EKG today, he was in atrial flutter.  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   We talked to him about the need to be on Coumadin.  We will start him on  5 mg a day, he will have his protime checked up in the office.  He gets  these periods of palpitations from time to time, he thinks it will pass.  Ray told him that Ray would like to see him up in [place] and at least  keep him on Coumadin until Ray can get a better idea of the natural  history of this.  He does appear to have a possible ablatable rhythm.   If this is not transient then he will need to be set up for elective  cardioversion.  If he appears to be going in and out of it Ray will have  to talk to our EP doctors about possible antiarrhythmic therapy.  He has  significant coronary disease and Ray would be a little hesitant to use  some antiarrhythmics on him.   Further recommendations will be based on his return visit to [facility]  next week.     Noralyn Pick. Eden Emms, MD, [facility]  Electronically Signed    PCN/MedQ  DD: 11/01/2006  DT: 11/01/2006  Job #: [id number]

## 2011-02-02 NOTE — H&P (Signed)
NAME:  Dennis, Ray                 ACCOUNT NO.:  [id number]   MEDICAL RECORD NO.:  [id number]           PATIENT TYPE:   LOCATION:                                 FACILITY:   PHYSICIAN:  R. Roetta Sessions, M.D. DATE OF BIRTH:  Dec 08, 1940   DATE OF ADMISSION:  DATE OF DISCHARGE:  LH                                HISTORY & PHYSICAL   CHIEF COMPLAINT:  Hematochezia.   HISTORY OF PRESENT ILLNESS:  [name] is a 70 year old gentleman  diagnosed with pan-proctocolitis back in 2001.  He has done well on Asacol  till the past couple of weeks, when he has noted small volume painless  hematochezia associated with bowel movements.  He has 1-3 bowel movements  daily.  He sees a small amount of blood several occasions since mid-  November.  He has not really had any abdominal pain. He really denies frank  constipation or diarrhea.  Back in 2001, he was demonstrated to have pan-  proctocolitis.  He did have granulomas and a biopsy raising the possibility  for Crohn colitis; however, he has at any rate remained in remission on  Asacol 800 mg t.i.d. since that time.  There is no family history of  colorectal neoplasia.  There is a personal history of colonic polyps.  He  did have a normal small-bowel follow-through on June 11, 2000.   PAST MEDICAL HISTORY:  1.  Coronary artery disease, status post myocardial infarction - December      2000.  He underwent angioplasty and stenting.  2.  History of hypertension.  3.  History of GERD.   CURRENT MEDICATIONS:  1.  Multivitamin daily.  2.  Vasotec 20 mg daily.  3.  Vitamin E and C daily.  4.  Procardia 60 mg daily.  5.  Asacol two tablets t.i.d.  6.  Lorcet Plus p.r.n. arthritis pain.  7.  Lopressor 50 mg b.i.d.  8.  Catapres 0.1 b.i.d.  9.  Lasix 40 mg daily.  10. Minoxidil 10 mg one half tablet in the morning, one half in the evening.   ALLERGIES:  No known drug allergies.   FAMILY HISTORY:  Mother succumbed to some  intra-abdominal cancer.  Father  burned to death in a fire.  Otherwise no chronic GI or liver illness.   SOCIAL HISTORY:  The patient has been married for 41 years.  He has two  children.  He works for [employer].  No tobacco.  Rarely  consumes alcohol.   REVIEW OF SYSTEMS:  No chest pain, dyspnea on exertion recently.  No upper  GI tract symptoms such as odynophagia, dysphagia, __________  reflux  symptoms, nausea, vomiting.   PHYSICAL EXAMINATION:  GENERAL:  A pleasant 70 year old gentleman, resting  comfortably.  VITAL SIGNS:  Weight 289, height 6 foot 5 inches, temp 98.1, BP 142/76,  pulse 68.  SKIN:  Warm and dry.  No jaundice.  HEENT:  No scleral icterus.  Conjunctivae are pink.  CHEST:  Lungs are clear to auscultation.  HEART:  Regular rate and rhythm without  murmur, gallop or rub.  ABDOMEN:  Nondistended.  Positive bowel sounds.  Soft and nontender without  appreciable mass, organomegaly.  EXTREMITIES:  No edema.  RECTAL:  Deferred till time of colonoscopy.   IMPRESSION:  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  He has had some small  volume painless hematochezia over the past couple of weeks.  It has been  over four and a half years since he has had his colon checked.  We need to  go ahead and do a colonoscopy at this time to further evaluate this new  symptom.  Potential risks, benefits, and alternatives have been reviewed.  Questions were answered.  He is agreeable.   PLAN:  Perform a colonoscopy in the very near future at [facility],  further recommendations to follow.     Otelia Sergeant   RMR/MEDQ  D:  08/15/2004  T:  08/15/2004  Job:  [id number]   cc:   Mila Homer. Sudie Bailey, M.D.  [address] [place], [place] [address]  Fax: [phone]   R. Roetta Sessions, M.D.  Fax: [phone]

## 2011-02-02 NOTE — H&P (Signed)
[facility] ADMISSION   NAME:Volpi, CHANDON LAZCANO                        MRN:          [id number]  DATE:03/29/2006                            DOB:          Oct 22, 1940    Ardit returns today for follow-up.  He is 70 years old.   Had a previous inferior wall MI.  He does not get classic angina.  He gets  left wrist tingling.  He has not had any of this recently.  He has fresh  nitroglycerin.  His total cholesterol was only 110 and he is on simvastatin.   Continues to have some mild lower extremity edema treated with Lasix.   The patient has always been somewhat hesitant to have a stress test.  He has  not had one since 2004.  I talked to him at length about this and he has  agreed to have one in the next week or two.  He needs to make a beach trip  down to [place] to remodel a house and does not want to have it at the end of  the month.   He is otherwise doing well and has no other new medical problems since I  last saw him.   On examination the blood pressure is 138/64, pulse 63, regular.  Lungs are  clear.  Carotids normal.  There is an S1, S2 with normal heart sounds.  Abdomen is benign.  Lower extremities have intact pulses and +1 edema  bilaterally.  His EKG shows sinus rhythm with a previous inferior wall  myocardial infarction.   IMPRESSION:  Stable coronary disease, previous inferior wall myocardial  infarction with stenting of the right coronary artery.  Follow up Myoview.  If this is normal he will continue his risk factor modification and primary  care follow-up with Dr. Ouida Sills.  I will see him back in six months.                                   Noralyn Pick. Eden Emms, MD, [facility]   PCN/MedQ  DD:  03/29/2006  DT:  03/29/2006  Job #:  [id number]   cc:   Kingsley Callander. Ouida Sills, MD

## 2011-02-02 NOTE — Procedures (Signed)
[place]  Patient:    Dennis Ray                           MRN: [id number] Proc. Date: 09/19/99 Adm. Date:  [id number] Attending:  Colon Branch CC:         Gareth Morgan, M.D.             Noralyn Pick. Eden Emms, M.D. [facility]             CV Laboratory                           Procedure Report  PROCEDURE:  Percutaneous stenting procedure.  CARDIOLOGIST:  Arturo Morton. Riley Kill, M.D.  INDICATIONS:  The patient is a pleasant 70 year old who presented with an acute  inferior infarction.  He was treated with lytic therapy.  He had successful reperfusion.  He was brought to the laboratory where he underwent a diagnostic catheterization by Dr. Theron Arista C. Nishan.  This demonstrated a high-grade stenosis in the mid-right coronary artery.  I was asked to come in and perform a percutaneous intervention.  We discussed the case with the patient and then subsequently with his family.  The decision was made to proceed.  DESCRIPTION OF PROCEDURE:  The procedure was performed from the right femoral artery.  An indwelling 6-French sheath was exchanged for a 7-French sheath.  A R4 Bright Tip guiding catheter was used to intubate the right coronary artery. The lesion was crossed with a 0.014 Hi-Torque floppy wire.  A 2.5 mm x 20.0 mm length ______ balloon was used to predilate the lesion.  We then stented the lesion with a 13.0 mm length x 3.0 mm BX velocity stent.  This was post-dilated with a 3.5 m balloon.  There was a reduction in the stenosis from 80% down to 0%, with marked improvement in the appearance of the vessel. The intraluminal thrombus was no longer present.  The stent was post-dilated using a 3.5 mm x 10.0 mm length ______ balloon.  Two dilatations were performed.  The procedure was completed.  The guiding catheter was removed.  The femoral sheath was sewn into place.  He was taken to the holding area in satisfactory clinical  condition.  Heparin was given at the beginning of the case.  ReoPro was also administered during the procedure.  The ACT was checked and was in the appropriate range.  HEMODYNAMIC DATA:  The central aortic pressure:  144/85.  ANGIOGRAPHIC DATA:  The right coronary artery was a large dominant vessel.  It provided a posterior descending branch, as well as several posterolateral branches. Beyond the origin of the right ventricular branch, was an area of plaquing of about 50%.  This did not appear to be critical or high-grade.  We therefore elected not to stent this area.  There was an 80%-90% acute lesion noted in the distal portion of the midvessel.  This was a focal stenosis with evidence of intraluminal thrombus.  Following stenting, this was reduced to 0% percent residual luminal narrowing.  There was 30% generalized narrowing distal to the stent site in the  distal vessel.  The PDA had a 30%-50% stenosis as well.  CONCLUSION:  Successful percutaneous intervention with stenting of the distal portion of the mid-right coronary artery.  DISPOSITION:  The patient will be treated with aspirin and Plavix.  His followup will be with Dr. Brett Canales  [place] and with Dr. Eden Emms.  In addition, the patient oes have heme-positive stools, and will need to have follow-up stool cards. DD:  09/19/99 TD:  09/19/99 Job: [id number]

## 2011-02-02 NOTE — Assessment & Plan Note (Signed)
NAME:  Dennis Ray, Dennis Ray                  CHART#:  [id number]   DATE:  04/17/2007                       DOB:  Jul 04, 1941   TELEPHONE NOTE FOR THE CHART   The patient underwent a second attempt at the colonoscopy on 04/02/2007.  Unfortunately, the prep was poor and his colon was long and in spite of  my best efforts I was unable to reach the cecum.  This is a second  attempt and this attempt was not successful.  Several  biopsies earlier  this year (12/04/2006) demonstrated some adenomatous mucosa where I did  not see any obvious polyp, which raises the issue of dysplasia.  I took  numerous biopsies of the colon, which was able to recover on 04/02/2007.  The biopsies came back really looking good with only minimally  edematous/hyperplastic appearing mucosa.  There was no evidence of  dysplasia or malignancy and endoscopically it looked as though he had a  fairly benign colon with only some subtle changes suggesting colitis.  Etiology of his colitis is felt to be more indeterminate with Crohn's  colitis not being excluded at this time.  His rectum was spared.   This colonoscopic evaluation was triggered this year by the patient  returning 3 out of 3 mail in hemoccult cards, which were noted to be  positive.  I spoke with the patient this evening and pointed out to him  that we have yet to see the upstream 1/3 or so of his colon and I  stressed the importance of holding out to get this done.  I recommended  to the patient that we send over Dr. Achilles Dunk over at [facility] and let Dr. Gwinda Passe try to get all the way around and take  segmental biopsies or whatever intervention would be appropriate.  I  will put a call into Dr. Gwinda Passe and see if we can get the patient set  up.  Clinically certainly the patient is doing extremely well and  clinically his IBD is in remission, taking 2 Asacol tablets t.i.d.       Jonathon Bellows, M.D.  Electronically Signed     RMR/MEDQ  D:   04/17/2007  T:  04/18/2007  Job:  [id number]

## 2011-02-02 NOTE — Op Note (Signed)
NAME:  Dennis Ray, Dennis Ray                 ACCOUNT NO.:  [id number]   MEDICAL RECORD NO.:  [id number]          PATIENT TYPE:  AMB   LOCATION:  DAY                           FACILITY:  [facility]   PHYSICIAN:  R. Roetta Sessions, M.D. DATE OF BIRTH:  Jan 24, 1941   DATE OF PROCEDURE:  09/25/2004  DATE OF DISCHARGE:                                 OPERATIVE REPORT   PROCEDURE:  Colonoscopy.   INDICATIONS FOR PROCEDURE:  The patient is a 70 year old gentleman with a  history of pan ulcerative colitis diagnosed back in 2001 who now came to my  office August 15, 2004 complaining of hematochezia. He has been on Asacol  2 tablets orally t.i.d. He tells me since he was seen in the office his  couple episodes of hematochezia resolved. He was having 1 to 2 bowel  movements daily and no blood per rectum, and stools have been formed taking  Asacol 2 tablets t.i.d. Colonoscopy is now being down. This approach has  been discussed with the patient at length. Potential risks, benefits, and  alternatives have been reviewed and questions answered. He is agreeable.  Please see documentation in the medical record.   PROCEDURE NOTE:  O2 saturation, blood pressure, pulse, and respirations  monitored throughout the entirety of the procedure. Conscious sedation with  Versed 5 mg IV and Demerol 100 mg IV in divided doses.   INSTRUMENT:  Olympus video chip system.   FINDINGS:  Digital rectal examination revealed no abnormalities.   ENDOSCOPIC FINDINGS:  Prep was marginal, granular liquid stool throughout  the colon (this will be consistent with the patient's report of eating solid  food for lunch yesterday).   Rectum:  Examination of the rectal mucosa including retroflexed view of anal  verge revealed no abnormalities.   Colon:  Colonic mucosa was surveyed from the rectosigmoid junction through  the left, transverse, and right colon to area of the appendiceal orifice,  ileocecal valve, and cecum. These structures  were well seen and photographed  for the record. Olympus video scope was slowly withdrawn, and all previously  mentioned mucosal surfaces were again seen. Again, the prep was marginal.  Quite a bit of washings had to be done to get an adequate view of the  colonic mucosa. The patient's rectum and sigmoid appeared endoscopically  normal; however, the more proximal descending, transverse, and right colon  appeared to be somewhat pitted and appeared to have diffuse fine submucosal  petechiae. No erosions or frank ulcerations were seen. The vascular pattern  was somewhat obscured in a patchy distribution all the way to the cecum.  Biopsies at the hepatitic flexure, descending, sigmoid, and rectum mucosa  were taken for histologic study. The patient tolerated the procedure well  and was reactive to endoscopy.   IMPRESSION:  Normal appearing rectum and sigmoid. Some inflammatory changes  of the more proximal colon, descending, transverse, and right segments that  really did not look too bad, and clinically he is a remission.   RECOMMENDATIONS:  1.  Continue Asacol 2 tablets orally t.i.d.  2.  Followup on pathology.  3.  Further recommendations to follow.     Otelia Sergeant   RMR/MEDQ  D:  09/25/2004  T:  09/25/2004  Job:  [id number]   cc:   Mila Homer. Sudie Bailey, M.D.  [address] [place], [place] [address]  Fax: [phone]

## 2011-02-02 NOTE — Op Note (Signed)
NAME:  Dennis Ray, Dennis Ray                 ACCOUNT NO.:  [id number]   MEDICAL RECORD NO.:  [id number]          PATIENT TYPE:  AMB   LOCATION:  DAY                           FACILITY:  [facility]   PHYSICIAN:  R. Roetta Sessions, M.D. DATE OF BIRTH:  07-20-1941   DATE OF PROCEDURE:  12/04/2006  DATE OF DISCHARGE:                               OPERATIVE REPORT   PROCEDURE:  Incomplete colonoscopy with segmental biopsies.   INDICATIONS FOR PROCEDURE:  70 year old gentleman with ulcerative  colitis felt to be in remission.  He returned three hemoccult cards and  they were all positive.  Colonoscopy is now being done.  This approach  has been discussed with the patient at length.  The potential risks,  benefits, and alternatives have been reviewed, and questions answered.  Please see documentation in the medical record.   DESCRIPTION OF PROCEDURE:  O2 saturation, blood pressure, and pulse  oximetry were monitored throughout the entire procedure.  Conscious  sedation with Versed 7 mg IV and Demerol 100 mg IV in divided doses.   INSTRUMENT USED:  Pentax video chip system.   FINDINGS:  Digital rectal examination revealed no abnormalities.  Endoscopic findings:  The prep was relatively poor on the right side  (see below).  The colonic mucosa was surveyed from the rectosigmoid  colon through the left, transverse, into the right segments.  The  patient's colon was quite redundant and elongated.  The sigmoid colon  appeared normal. As the descending colon and transverse segments were  traversed, there was loss of the normal vascular pattern.  There was  diffuse pitting of the colonic mucosa.  There were no erosions or  ulceration, no polyp or neoplasm.  The colon was quite redundant on the  right side and I was unable to reach the cecum, although I thought I saw  the cecum in the distance.  This gentleman's colon was, again, elongated  and redundant and it was a relatively poor prep in the right colon  with  quite a bit of vegetable matter that kept getting in the way.  I  exhausted all maneuvers including various applications of abdominal  pressure by staff members in all positions and was not able to attain  cecal intubation.  The patient tolerated this rather prolonged attempt  extremely well and was very cooperative.  From the level of the right  colon, the scope was withdrawn.  All previously mentioned mucosa  surfaces were again seen.  Segmental biopsies were taken.  Again, the  sigmoid and rectum appeared to be pretty much endoscopically normal.  The rectum was thoroughly inspected at the conclusion of the procedure  including a retroflex view of the anal verge and was also biopsied.  The  patient tolerated the procedure well and was reacted in endoscopy.   IMPRESSION:  Endoscopically normal appearing rectum and sigmoid, chronic  inflammatory changes in the more proximal colon, redundancy and poor  prep precluded cecal intubation.  The distribution of the inflammatory  changes raises the question of whether or not he may have Crohn's  colitis.  CBC is  pending.  Will follow up on pathology and CBC.  Will  make further recommendations in regards to imaging the right colon in  the near future.      Jonathon Bellows, M.D.  Electronically Signed     RMR/MEDQ  D:  12/04/2006  T:  12/04/2006  Job:  [id number]

## 2011-02-05 ENCOUNTER — Ambulatory Visit (INDEPENDENT_AMBULATORY_CARE_PROVIDER_SITE_OTHER): Payer: Medicare Other | Admitting: *Deleted

## 2011-02-05 DIAGNOSIS — Z7901 Long term (current) use of anticoagulants: Secondary | ICD-10-CM

## 2011-02-05 DIAGNOSIS — I4891 Unspecified atrial fibrillation: Secondary | ICD-10-CM

## 2011-02-05 MED ORDER — WARFARIN SODIUM 2 MG PO TABS: 2.0000 mg | ORAL_TABLET | ORAL | Status: DC

## 2011-02-14 ENCOUNTER — Ambulatory Visit (INDEPENDENT_AMBULATORY_CARE_PROVIDER_SITE_OTHER): Payer: Medicare Other | Admitting: *Deleted

## 2011-02-14 DIAGNOSIS — I4891 Unspecified atrial fibrillation: Secondary | ICD-10-CM

## 2011-02-14 DIAGNOSIS — Z7901 Long term (current) use of anticoagulants: Secondary | ICD-10-CM

## 2011-02-15 DIAGNOSIS — F1011 Alcohol abuse, in remission: Secondary | ICD-10-CM | POA: Insufficient documentation

## 2011-02-15 DIAGNOSIS — K219 Gastro-esophageal reflux disease without esophagitis: Secondary | ICD-10-CM | POA: Insufficient documentation

## 2011-02-15 DIAGNOSIS — I4892 Unspecified atrial flutter: Secondary | ICD-10-CM | POA: Insufficient documentation

## 2011-02-15 DIAGNOSIS — E785 Hyperlipidemia, unspecified: Secondary | ICD-10-CM | POA: Insufficient documentation

## 2011-02-19 ENCOUNTER — Ambulatory Visit (INDEPENDENT_AMBULATORY_CARE_PROVIDER_SITE_OTHER): Payer: Medicare Other | Admitting: *Deleted

## 2011-02-19 ENCOUNTER — Ambulatory Visit (INDEPENDENT_AMBULATORY_CARE_PROVIDER_SITE_OTHER): Payer: Medicare Other | Admitting: Cardiology

## 2011-02-19 ENCOUNTER — Encounter: Payer: Self-pay | Admitting: Cardiology

## 2011-02-19 DIAGNOSIS — I4891 Unspecified atrial fibrillation: Secondary | ICD-10-CM

## 2011-02-19 DIAGNOSIS — Z7901 Long term (current) use of anticoagulants: Secondary | ICD-10-CM

## 2011-02-19 DIAGNOSIS — I10 Essential (primary) hypertension: Secondary | ICD-10-CM

## 2011-02-19 DIAGNOSIS — I2510 Atherosclerotic heart disease of native coronary artery without angina pectoris: Secondary | ICD-10-CM

## 2011-02-19 DIAGNOSIS — E785 Hyperlipidemia, unspecified: Secondary | ICD-10-CM

## 2011-02-19 LAB — POCT INR: INR: 1.9

## 2011-02-19 NOTE — Assessment & Plan Note (Signed)
Patient is experiencing no symptoms suggesting progression of coronary disease; strategy will continue to be optimal management of risk factors.

## 2011-02-19 NOTE — Progress Notes (Signed)
HPI : Dennis Ray returns to the office for continued assessment and treatment of atrial arrhythmias requiring chronic anticoagulation.  Since his last visit, he has done quite well.  He has experienced some diarrhea but no hematochezia or melena.  He denies dyspnea, chest discomfort, syncope or pedal edema.  INRs have been variable with both inadequate and excessive anticoagulation.  Patient reports that he has monitored blood pressure at home with excellent results.  Systolics are typically in the 120s or 130s.  Unfortunately, he lost the list of BP values.  He will continue to obtain frequent readings with his sphygmomanometer.  Current Outpatient Prescriptions on File Prior to Visit  Medication Sig Dispense Refill  . amLODipine (NORVASC) 2.5 MG tablet Take 1 tablet (2.5 mg total) by mouth daily.  30 tablet  3  . cloNIDine (CATAPRES) 0.1 MG tablet Take 0.1 mg by mouth 2 (two) times daily.        . fenofibrate 160 MG tablet Take 160 mg by mouth daily.        . furosemide (LASIX) 40 MG tablet Take 40 mg by mouth daily.        . hydrocodone-acetaminophen (LORCET PLUS) 7.5-650 MG per tablet Take 1 tablet by mouth as needed.       [name] lisinopril (PRINIVIL,ZESTRIL) 40 MG tablet Take 40 mg by mouth daily.        . mesalamine (ASACOL) 400 MG EC tablet Take 400 mg by mouth. Take 3 tablets 2 times a day.       . metoprolol (LOPRESSOR) 50 MG tablet Take 50 mg by mouth 2 (two) times daily.        . nitroGLYCERIN (NITROSTAT) 0.4 MG SL tablet Place 0.4 mg under the tongue every 5 (five) minutes as needed. Take 1 tablet under the tongue at onset of chest pain; you may repeat every 5 minutes for up to 3 doses.       . potassium chloride SA (K-DUR,KLOR-CON) 20 MEQ tablet Take 20 mEq by mouth daily.        . simvastatin (ZOCOR) 20 MG tablet Take 20 mg by mouth at bedtime.        [name] terazosin (HYTRIN) 10 MG capsule Take 1 capsule (10 mg total) by mouth as directed. Take 5mg  daily x1 week then increase to 10mg  daily  30  capsule  3  . warfarin (COUMADIN) 2 MG tablet Take 1 tablet (2 mg total) by mouth as directed.  30 tablet  3  . zolpidem (AMBIEN) 10 MG tablet Take 10 mg by mouth at bedtime as needed.          No Known Allergies    Past medical history, social history, and family history reviewed and updated.  ROS: See history of present illness.  PHYSICAL EXAM: BP 128/72  Pulse 63  Ht 6\' 5"  (1.956 m)  Wt 273 lb (123.832 kg)  BMI 32.37 kg/m2  SpO2 93%  General-Well developed; no acute distress Body habitus-overweight Neck-No JVD; no carotid bruits Lungs-clear lung fields; resonant to percussion Cardiovascular-normal PMI; normal S1 and S2; grade 1/6 holosystolic murmur at the lower left sternal border Abdomen-normal bowel sounds; soft and non-tender without masses or organomegaly Musculoskeletal-No deformities, no cyanosis or clubbing Neurologic-Normal cranial nerves; symmetric strength and tone Skin-Warm, no significant lesions Extremities-distal pulses intact; 1/2+ edema   ASSESSMENT AND PLAN:

## 2011-02-19 NOTE — Assessment & Plan Note (Signed)
Patient would prefer to take fewer medications, and fenofibrate may be superfluous.  Nonetheless, with independent evidence for the efficacy of fibrates in patients with coronary disease, I would be inclined to continue a 2 drug regimen for hyperlipidemia.

## 2011-02-19 NOTE — Assessment & Plan Note (Signed)
Hemoccult testing and serial assessment of CBCs will be performed to exclude occult GI blood loss.

## 2011-02-19 NOTE — Assessment & Plan Note (Signed)
Blood pressure control is excellent; current medications will be continued. 

## 2011-02-19 NOTE — Assessment & Plan Note (Signed)
Heart rate is well controlled; patient reports no symptoms attributable to his arrhythmia.  We will continue to pursue a rate control strategy.

## 2011-02-19 NOTE — Patient Instructions (Signed)
Your physician recommends that you schedule a follow-up appointment in: 8 MONTHS Your physician has requested that you regularly monitor and record your blood pressure readings at home. Please use the same machine at the same time of day to check your readings and record them to bring to your follow-up visit. STOOL CARDS- FOLLOW INSTRUCTIONS IN PACKET

## 2011-02-26 ENCOUNTER — Ambulatory Visit (INDEPENDENT_AMBULATORY_CARE_PROVIDER_SITE_OTHER): Payer: Medicare Other | Admitting: *Deleted

## 2011-02-26 ENCOUNTER — Encounter: Payer: Medicare Other | Admitting: *Deleted

## 2011-02-26 DIAGNOSIS — Z7901 Long term (current) use of anticoagulants: Secondary | ICD-10-CM

## 2011-02-26 DIAGNOSIS — I4891 Unspecified atrial fibrillation: Secondary | ICD-10-CM

## 2011-02-26 LAB — CBC AND DIFFERENTIAL: Platelets: 216 10*3/uL (ref 150–399)

## 2011-02-26 LAB — POCT INR: INR: 1.4

## 2011-03-05 ENCOUNTER — Ambulatory Visit (INDEPENDENT_AMBULATORY_CARE_PROVIDER_SITE_OTHER): Payer: Medicare Other | Admitting: *Deleted

## 2011-03-05 ENCOUNTER — Encounter: Payer: Medicare Other | Admitting: *Deleted

## 2011-03-05 DIAGNOSIS — Z7901 Long term (current) use of anticoagulants: Secondary | ICD-10-CM

## 2011-03-05 DIAGNOSIS — I4891 Unspecified atrial fibrillation: Secondary | ICD-10-CM

## 2011-03-14 ENCOUNTER — Ambulatory Visit (INDEPENDENT_AMBULATORY_CARE_PROVIDER_SITE_OTHER): Payer: Medicare Other | Admitting: *Deleted

## 2011-03-14 DIAGNOSIS — I4891 Unspecified atrial fibrillation: Secondary | ICD-10-CM

## 2011-03-14 DIAGNOSIS — Z7901 Long term (current) use of anticoagulants: Secondary | ICD-10-CM

## 2011-03-26 ENCOUNTER — Ambulatory Visit (INDEPENDENT_AMBULATORY_CARE_PROVIDER_SITE_OTHER): Payer: Medicare Other | Admitting: *Deleted

## 2011-03-26 DIAGNOSIS — I4891 Unspecified atrial fibrillation: Secondary | ICD-10-CM

## 2011-03-26 DIAGNOSIS — Z7901 Long term (current) use of anticoagulants: Secondary | ICD-10-CM

## 2011-03-26 LAB — POCT INR: INR: 2.4

## 2011-04-09 ENCOUNTER — Encounter: Payer: Medicare Other | Admitting: *Deleted

## 2011-04-11 ENCOUNTER — Ambulatory Visit (INDEPENDENT_AMBULATORY_CARE_PROVIDER_SITE_OTHER): Payer: Medicare Other | Admitting: *Deleted

## 2011-04-11 DIAGNOSIS — I4891 Unspecified atrial fibrillation: Secondary | ICD-10-CM

## 2011-04-11 DIAGNOSIS — Z7901 Long term (current) use of anticoagulants: Secondary | ICD-10-CM

## 2011-04-12 ENCOUNTER — Encounter: Payer: Medicare Other | Admitting: *Deleted

## 2011-04-26 ENCOUNTER — Encounter (INDEPENDENT_AMBULATORY_CARE_PROVIDER_SITE_OTHER): Payer: Self-pay

## 2011-04-27 ENCOUNTER — Other Ambulatory Visit: Payer: Self-pay | Admitting: Cardiovascular Disease

## 2011-05-09 ENCOUNTER — Ambulatory Visit (INDEPENDENT_AMBULATORY_CARE_PROVIDER_SITE_OTHER): Payer: Medicare Other | Admitting: *Deleted

## 2011-05-09 DIAGNOSIS — Z7901 Long term (current) use of anticoagulants: Secondary | ICD-10-CM

## 2011-05-09 DIAGNOSIS — I4891 Unspecified atrial fibrillation: Secondary | ICD-10-CM

## 2011-05-09 LAB — POCT INR: INR: 1.7

## 2011-05-23 ENCOUNTER — Ambulatory Visit (INDEPENDENT_AMBULATORY_CARE_PROVIDER_SITE_OTHER): Payer: Medicare Other | Admitting: *Deleted

## 2011-05-23 DIAGNOSIS — Z7901 Long term (current) use of anticoagulants: Secondary | ICD-10-CM

## 2011-05-23 DIAGNOSIS — I4891 Unspecified atrial fibrillation: Secondary | ICD-10-CM

## 2011-05-23 LAB — POCT INR: INR: 2

## 2011-06-06 ENCOUNTER — Other Ambulatory Visit: Payer: Self-pay | Admitting: Cardiology

## 2011-06-13 LAB — BASIC METABOLIC PANEL
CO2: 33 — ABNORMAL HIGH
Calcium: 8.7
GFR calc Af Amer: >60
GFR calc non Af Amer: >60
Sodium: 140

## 2011-06-13 LAB — HEMOGLOBIN AND HEMATOCRIT, BLOOD
HCT: 41.6
Hemoglobin: 14.5

## 2011-06-14 ENCOUNTER — Ambulatory Visit (INDEPENDENT_AMBULATORY_CARE_PROVIDER_SITE_OTHER): Payer: Medicare Other | Admitting: *Deleted

## 2011-06-14 ENCOUNTER — Encounter: Payer: Medicare Other | Admitting: *Deleted

## 2011-06-14 DIAGNOSIS — Z7901 Long term (current) use of anticoagulants: Secondary | ICD-10-CM

## 2011-06-14 DIAGNOSIS — I4891 Unspecified atrial fibrillation: Secondary | ICD-10-CM

## 2011-06-14 LAB — POCT INR: INR: 1.9

## 2011-06-26 ENCOUNTER — Other Ambulatory Visit: Payer: Self-pay | Admitting: Cardiology

## 2011-07-04 ENCOUNTER — Ambulatory Visit (INDEPENDENT_AMBULATORY_CARE_PROVIDER_SITE_OTHER): Payer: Medicare Other | Admitting: *Deleted

## 2011-07-04 DIAGNOSIS — I4891 Unspecified atrial fibrillation: Secondary | ICD-10-CM

## 2011-07-04 DIAGNOSIS — Z7901 Long term (current) use of anticoagulants: Secondary | ICD-10-CM

## 2011-07-16 ENCOUNTER — Encounter (INDEPENDENT_AMBULATORY_CARE_PROVIDER_SITE_OTHER): Payer: Self-pay | Admitting: Internal Medicine

## 2011-07-16 ENCOUNTER — Ambulatory Visit (INDEPENDENT_AMBULATORY_CARE_PROVIDER_SITE_OTHER): Payer: Medicare Other | Admitting: Internal Medicine

## 2011-07-16 ENCOUNTER — Telehealth (INDEPENDENT_AMBULATORY_CARE_PROVIDER_SITE_OTHER): Payer: Self-pay | Admitting: *Deleted

## 2011-07-16 VITALS — BP 132/76 | HR 80 | Temp 97.8°F | Resp 14 | Ht 77.0 in | Wt 282.0 lb

## 2011-07-16 DIAGNOSIS — K5190 Ulcerative colitis, unspecified, without complications: Secondary | ICD-10-CM

## 2011-07-16 NOTE — Progress Notes (Signed)
Presenting complaint; followup for ulcerative colitis Subjective; patient is 70 year old Caucasian male was chronic ulcerative colitis and 2 surveillance colonoscopies have revealed DALM lesions in proximal colon. He had been reluctant to undergo colectomy. Last December he bled while on anticoagulant and antiplatelet agent for new onset of atrial fibrillation. He had multiple ulcers involving his proximal colon. He underwent extended right hemicolectomy by Dr. Leticia Penna in January this year. He had multiple benign appearing ulcers in as a sending colon but no dysplastic lesions were noted.  He feels fine. He is back on warfarin and has not had any more episodes of rectal bleeding or melena. He has a good appetite. His weight is only 8 pounds less than his previous surgery weight. He has intermittent diarrhea easily controlled with Imodium he takes no more than 2 doses of week. He denies abdominal pain. Current medications Current Outpatient Prescriptions on File Prior to Visit  Medication Sig Dispense Refill  . COUMADIN 2 MG tablet TAKE 1 TABLET BY MOUTH ASDIRECTED.  30 each  3  . fenofibrate 160 MG tablet Take 160 mg by mouth daily.        . Ferrous Sulfate (IRON) 325 (65 FE) MG TABS Take 1 tablet by mouth daily.        . furosemide (LASIX) 40 MG tablet Take 40 mg by mouth daily.        . hydrocodone-acetaminophen (LORCET PLUS) 7.5-650 MG per tablet Take 1 tablet by mouth as needed.       [name] lisinopril (PRINIVIL,ZESTRIL) 40 MG tablet Take 40 mg by mouth daily.        . metoprolol (LOPRESSOR) 50 MG tablet Take 50 mg by mouth 2 (two) times daily.        [name] NITROQUICK 0.4 MG SL tablet DISSOLVE 1 TABLET UNDER  TONGUE FOR CHEST PAIN.MAY REPEAT EVERY 5 MIN UPTO 3 DOSES-NO RELIEF,CALL  100 each  3  . NORVASC 2.5 MG tablet TAKE ONE TABLET BY MOUTH ONCE DAILY.  30 each  8  . potassium chloride SA (K-DUR,KLOR-CON) 20 MEQ tablet Take 20 mEq by mouth daily.        . simvastatin (ZOCOR) 20 MG tablet Take 20 mg by  mouth at bedtime.        [name] terazosin (HYTRIN) 10 MG capsule Take 1 capsule (10 mg total) by mouth as directed. Take 5mg  daily x1 week then increase to 10mg  daily  30 capsule  3  . zolpidem (AMBIEN) 10 MG tablet Take 10 mg by mouth at bedtime as needed.        Objective. BP 132/76  Pulse 80  Temp(Src) 97.8 F (36.6 C) (Oral)  Resp 14  Ht 6\' 5"  (1.956 m)  Wt 127.914 kg (282 lb)  BMI 33.44 kg/m2 Conjunctiva is pink. Sclera is nonicteric. Oropharyngeal mucosa is normal. No neck masses or thyromegaly noted. He has thick mid abdominal scar to the right of umbilicus. Abdomen is soft and nontender without organomegaly or masses. Rectal examination reveals brown guaiac-negative stool. No LE edema or clubbing noted. Assessment. Chronic ulcerative colitis status post extended right hemicolectomy in January this year. Pathology did not reveal dysplastic lesions or cancer. He is having intermittent diarrhea which is the result of surgery. He appears to be in remission. Plan Continue Asacol at current dose. Office visit in one year unless has problems. Next colonoscopy would be in December,2014. He does not appear to be in manic.

## 2011-07-16 NOTE — Telephone Encounter (Signed)
Wants rx for asachol sent to [facility] ([place])

## 2011-07-16 NOTE — Patient Instructions (Signed)
Continue Asacol at current dose. Continue Imodium OTC and diarrhea as you're doing. Notify if you see blood in  stool

## 2011-07-20 NOTE — Telephone Encounter (Signed)
I spoke with the patient and her request that we call in Asacol 400 mg he takes 800 mg in the morning and 800 mg in the evening #120 with 11 refills.This was called to [facility]. The patient was made aware.

## 2011-08-01 ENCOUNTER — Ambulatory Visit (INDEPENDENT_AMBULATORY_CARE_PROVIDER_SITE_OTHER): Payer: Medicare Other | Admitting: *Deleted

## 2011-08-01 DIAGNOSIS — I4891 Unspecified atrial fibrillation: Secondary | ICD-10-CM

## 2011-08-01 DIAGNOSIS — Z7901 Long term (current) use of anticoagulants: Secondary | ICD-10-CM

## 2011-08-21 ENCOUNTER — Encounter: Payer: Self-pay | Admitting: Cardiology

## 2011-08-29 ENCOUNTER — Ambulatory Visit (INDEPENDENT_AMBULATORY_CARE_PROVIDER_SITE_OTHER): Payer: Medicare Other | Admitting: *Deleted

## 2011-08-29 DIAGNOSIS — Z7901 Long term (current) use of anticoagulants: Secondary | ICD-10-CM

## 2011-08-29 DIAGNOSIS — I4891 Unspecified atrial fibrillation: Secondary | ICD-10-CM

## 2011-09-17 ENCOUNTER — Other Ambulatory Visit: Payer: Self-pay | Admitting: Cardiology

## 2011-09-17 MED ORDER — WARFARIN SODIUM 2 MG PO TABS: 2.0000 mg | ORAL_TABLET | Freq: Every day | ORAL | Status: DC

## 2011-09-17 NOTE — Telephone Encounter (Signed)
Please see note below. 

## 2011-09-17 NOTE — Telephone Encounter (Signed)
PT LAST RX WAS CALLED IN FOR 5MG  ( IT WAS CALLED IN WRONG) HE NEEDS 2 MG CALLED IN TO [facility].

## 2011-09-19 ENCOUNTER — Other Ambulatory Visit: Payer: Self-pay | Admitting: Cardiology

## 2011-09-19 ENCOUNTER — Encounter: Payer: Self-pay | Admitting: *Deleted

## 2011-09-19 ENCOUNTER — Ambulatory Visit (INDEPENDENT_AMBULATORY_CARE_PROVIDER_SITE_OTHER): Payer: Medicare Other | Admitting: *Deleted

## 2011-09-19 DIAGNOSIS — Z7901 Long term (current) use of anticoagulants: Secondary | ICD-10-CM | POA: Diagnosis not present

## 2011-09-19 DIAGNOSIS — I4891 Unspecified atrial fibrillation: Secondary | ICD-10-CM

## 2011-09-19 LAB — PROTIME-INR: Prothrombin Time: 67.5 seconds — ABNORMAL HIGH (ref 11.6–15.2)

## 2011-09-19 NOTE — Patient Instructions (Signed)
Pt verbalized understanding.

## 2011-09-19 NOTE — Progress Notes (Signed)
This encounter was created in error - please disregard.

## 2011-09-20 ENCOUNTER — Ambulatory Visit (INDEPENDENT_AMBULATORY_CARE_PROVIDER_SITE_OTHER): Payer: Medicare Other | Admitting: *Deleted

## 2011-09-20 DIAGNOSIS — I4891 Unspecified atrial fibrillation: Secondary | ICD-10-CM

## 2011-09-20 DIAGNOSIS — Z7901 Long term (current) use of anticoagulants: Secondary | ICD-10-CM

## 2011-09-27 ENCOUNTER — Ambulatory Visit (INDEPENDENT_AMBULATORY_CARE_PROVIDER_SITE_OTHER): Payer: Medicare Other | Admitting: *Deleted

## 2011-09-27 DIAGNOSIS — Z7901 Long term (current) use of anticoagulants: Secondary | ICD-10-CM | POA: Diagnosis not present

## 2011-09-27 DIAGNOSIS — I4891 Unspecified atrial fibrillation: Secondary | ICD-10-CM | POA: Diagnosis not present

## 2011-09-27 LAB — POCT INR: INR: 2.6

## 2011-10-18 ENCOUNTER — Ambulatory Visit (INDEPENDENT_AMBULATORY_CARE_PROVIDER_SITE_OTHER): Payer: Medicare Other | Admitting: *Deleted

## 2011-10-18 DIAGNOSIS — Z7901 Long term (current) use of anticoagulants: Secondary | ICD-10-CM

## 2011-10-18 DIAGNOSIS — I4891 Unspecified atrial fibrillation: Secondary | ICD-10-CM

## 2011-10-23 DIAGNOSIS — L97909 Non-pressure chronic ulcer of unspecified part of unspecified lower leg with unspecified severity: Secondary | ICD-10-CM | POA: Diagnosis not present

## 2011-10-23 DIAGNOSIS — L570 Actinic keratosis: Secondary | ICD-10-CM | POA: Diagnosis not present

## 2011-10-23 DIAGNOSIS — D235 Other benign neoplasm of skin of trunk: Secondary | ICD-10-CM | POA: Diagnosis not present

## 2011-10-23 DIAGNOSIS — Z85828 Personal history of other malignant neoplasm of skin: Secondary | ICD-10-CM | POA: Diagnosis not present

## 2011-10-31 ENCOUNTER — Other Ambulatory Visit: Payer: Self-pay | Admitting: Cardiovascular Disease

## 2011-11-08 ENCOUNTER — Ambulatory Visit (INDEPENDENT_AMBULATORY_CARE_PROVIDER_SITE_OTHER): Payer: Medicare Other | Admitting: *Deleted

## 2011-11-08 DIAGNOSIS — I4891 Unspecified atrial fibrillation: Secondary | ICD-10-CM

## 2011-11-08 DIAGNOSIS — Z7901 Long term (current) use of anticoagulants: Secondary | ICD-10-CM | POA: Diagnosis not present

## 2011-11-08 LAB — POCT INR: INR: 1.9

## 2011-11-09 ENCOUNTER — Ambulatory Visit: Payer: Medicare Other | Admitting: Adult Health

## 2011-11-12 DIAGNOSIS — Z79899 Other long term (current) drug therapy: Secondary | ICD-10-CM | POA: Diagnosis not present

## 2011-11-12 DIAGNOSIS — D649 Anemia, unspecified: Secondary | ICD-10-CM | POA: Diagnosis not present

## 2011-11-13 DIAGNOSIS — L821 Other seborrheic keratosis: Secondary | ICD-10-CM | POA: Diagnosis not present

## 2011-11-13 DIAGNOSIS — L918 Other hypertrophic disorders of the skin: Secondary | ICD-10-CM | POA: Diagnosis not present

## 2011-11-13 DIAGNOSIS — B351 Tinea unguium: Secondary | ICD-10-CM | POA: Diagnosis not present

## 2011-11-15 ENCOUNTER — Ambulatory Visit (INDEPENDENT_AMBULATORY_CARE_PROVIDER_SITE_OTHER): Payer: Medicare Other | Admitting: *Deleted

## 2011-11-15 DIAGNOSIS — Z7901 Long term (current) use of anticoagulants: Secondary | ICD-10-CM

## 2011-11-15 DIAGNOSIS — I4891 Unspecified atrial fibrillation: Secondary | ICD-10-CM

## 2011-11-15 LAB — POCT INR: INR: 2.1

## 2011-11-16 ENCOUNTER — Encounter: Payer: Self-pay | Admitting: Adult Health

## 2011-11-16 ENCOUNTER — Ambulatory Visit (INDEPENDENT_AMBULATORY_CARE_PROVIDER_SITE_OTHER): Payer: Medicare Other | Admitting: Adult Health

## 2011-11-16 DIAGNOSIS — I10 Essential (primary) hypertension: Secondary | ICD-10-CM

## 2011-11-16 DIAGNOSIS — E785 Hyperlipidemia, unspecified: Secondary | ICD-10-CM

## 2011-11-16 DIAGNOSIS — I4891 Unspecified atrial fibrillation: Secondary | ICD-10-CM | POA: Diagnosis not present

## 2011-11-16 NOTE — Assessment & Plan Note (Signed)
Reminded him to take his medications daily as directed.,  He verbalizes understanding.

## 2011-11-16 NOTE — Assessment & Plan Note (Signed)
Heart rate is essentially well controlled but irregular on this evaluation., He has not taken his medications yet this am. He is to continue current medications and follow-up in our coumadin clinic.

## 2011-11-16 NOTE — Assessment & Plan Note (Signed)
Labs are recently drawn by Dr. Ouida Sills one week ago. I do not see results yet.  Will request results.

## 2011-11-16 NOTE — Patient Instructions (Signed)
Your physician recommends that you schedule a follow-up appointment in: 6 months  

## 2011-11-16 NOTE — Progress Notes (Signed)
HPI : Dennis Ray returns to the office for continued assessment and treatment of atrial arrhythmias requiring chronic anticoagulation.  Since his last visit, he has done quite well.  He has experienced some diarrhea but no hematochezia or melena.  He denies dyspnea, chest discomfort, syncope or pedal edema.  INRs have been variable with both inadequate and excessive anticoagulation.   He is without complaint. He has not taken his medication today and therefore his BP is elevated today.    Current Outpatient Prescriptions on File Prior to Visit  Medication Sig Dispense Refill  . fenofibrate 160 MG tablet Take 160 mg by mouth daily.        . furosemide (LASIX) 40 MG tablet Take 40 mg by mouth daily.        . hydrocodone-acetaminophen (LORCET PLUS) 7.5-650 MG per tablet Take 1 tablet by mouth as needed.       [name] lisinopril (PRINIVIL,ZESTRIL) 40 MG tablet Take 40 mg by mouth daily.        [name] loperamide (IMODIUM) 2 MG capsule Take 2 mg by mouth as needed.        . Mesalamine (ASACOL HD) 800 MG TBEC Take by mouth. Take 1 tablet twice a day       . metoprolol (LOPRESSOR) 50 MG tablet Take 50 mg by mouth 2 (two) times daily.        [name] NITROQUICK 0.4 MG SL tablet DISSOLVE 1 TABLET UNDER TONGUE FOR CHEST PAIN. MAY REPEAT EVERY 5 MINUP TO 3 DOSES-NO RELIEF,CALL 911.  25 each  11  . NORVASC 2.5 MG tablet TAKE ONE TABLET BY MOUTH ONCE DAILY.  30 each  8  . potassium chloride SA (K-DUR,KLOR-CON) 20 MEQ tablet Take 20 mEq by mouth daily.        . simvastatin (ZOCOR) 20 MG tablet Take 20 mg by mouth at bedtime.        [name] terazosin (HYTRIN) 10 MG capsule Take 1 capsule (10 mg total) by mouth as directed. Take 5mg  daily x1 week then increase to 10mg  daily  30 capsule  3  . warfarin (COUMADIN) 2 MG tablet Take 1 tablet (2 mg total) by mouth daily.  30 tablet  3  . zolpidem (AMBIEN) 10 MG tablet Take 10 mg by mouth at bedtime as needed.          No Known Allergies    Past medical history, social history, and family  history reviewed and updated.  ROS: See history of present illness.  PHYSICAL EXAM: BP 185/91  Pulse 70  Resp 16  Ht 6\' 5"  (1.956 m)  Wt 284 lb (128.822 kg)  BMI 33.68 kg/m2  General-Well developed; no acute distress Body habitus-overweight Neck-No JVD; no carotid bruits Lungs-clear lung fields; resonant to percussion Cardiovascular-normal PMI; normal S1 and S2; grade 1/6 holosystolic murmur at the lower left sternal border Abdomen-normal bowel sounds; soft and non-tender without masses or organomegaly Musculoskeletal-No deformities, no cyanosis or clubbing Neurologic-Normal cranial nerves; symmetric strength and tone Skin-Warm, no significant lesions Extremities-distal pulses intact; 1/2+ edema   ASSESSMENT AND PLAN:

## 2011-11-20 DIAGNOSIS — I10 Essential (primary) hypertension: Secondary | ICD-10-CM | POA: Diagnosis not present

## 2011-11-20 DIAGNOSIS — M545 Low back pain: Secondary | ICD-10-CM | POA: Diagnosis not present

## 2011-11-20 DIAGNOSIS — D509 Iron deficiency anemia, unspecified: Secondary | ICD-10-CM | POA: Diagnosis not present

## 2011-11-20 DIAGNOSIS — E1169 Type 2 diabetes mellitus with other specified complication: Secondary | ICD-10-CM | POA: Diagnosis not present

## 2011-12-13 ENCOUNTER — Ambulatory Visit (INDEPENDENT_AMBULATORY_CARE_PROVIDER_SITE_OTHER): Payer: Medicare Other | Admitting: *Deleted

## 2011-12-13 DIAGNOSIS — I4891 Unspecified atrial fibrillation: Secondary | ICD-10-CM | POA: Diagnosis not present

## 2011-12-13 DIAGNOSIS — Z7901 Long term (current) use of anticoagulants: Secondary | ICD-10-CM

## 2011-12-25 DIAGNOSIS — I872 Venous insufficiency (chronic) (peripheral): Secondary | ICD-10-CM | POA: Diagnosis not present

## 2012-01-10 ENCOUNTER — Ambulatory Visit (INDEPENDENT_AMBULATORY_CARE_PROVIDER_SITE_OTHER): Payer: Medicare Other | Admitting: *Deleted

## 2012-01-10 DIAGNOSIS — Z7901 Long term (current) use of anticoagulants: Secondary | ICD-10-CM | POA: Diagnosis not present

## 2012-01-10 DIAGNOSIS — I4891 Unspecified atrial fibrillation: Secondary | ICD-10-CM | POA: Diagnosis not present

## 2012-01-11 ENCOUNTER — Other Ambulatory Visit (HOSPITAL_COMMUNITY): Payer: Self-pay | Admitting: Internal Medicine

## 2012-01-11 ENCOUNTER — Ambulatory Visit (HOSPITAL_COMMUNITY)
Admission: RE | Admit: 2012-01-11 | Discharge: 2012-01-11 | Disposition: A | Discharge status: Home / Self Care | Payer: Medicare Other | Source: Ambulatory Visit | Attending: Internal Medicine | Admitting: Internal Medicine

## 2012-01-11 DIAGNOSIS — R079 Chest pain, unspecified: Secondary | ICD-10-CM | POA: Diagnosis not present

## 2012-01-11 DIAGNOSIS — R52 Pain, unspecified: Secondary | ICD-10-CM

## 2012-01-15 DIAGNOSIS — Z961 Presence of intraocular lens: Secondary | ICD-10-CM | POA: Diagnosis not present

## 2012-01-15 DIAGNOSIS — H2510 Age-related nuclear cataract, unspecified eye: Secondary | ICD-10-CM | POA: Diagnosis not present

## 2012-01-23 DIAGNOSIS — H2510 Age-related nuclear cataract, unspecified eye: Secondary | ICD-10-CM | POA: Diagnosis not present

## 2012-01-24 ENCOUNTER — Other Ambulatory Visit: Payer: Self-pay | Admitting: Cardiology

## 2012-01-28 ENCOUNTER — Ambulatory Visit (INDEPENDENT_AMBULATORY_CARE_PROVIDER_SITE_OTHER): Payer: Medicare Other | Admitting: Internal Medicine

## 2012-01-28 ENCOUNTER — Encounter (INDEPENDENT_AMBULATORY_CARE_PROVIDER_SITE_OTHER): Payer: Self-pay | Admitting: Internal Medicine

## 2012-01-28 DIAGNOSIS — K5190 Ulcerative colitis, unspecified, without complications: Secondary | ICD-10-CM

## 2012-01-28 MED ORDER — MESALAMINE ER 0.375 G PO CP24: 1500.0000 mg | ORAL_CAPSULE | Freq: Every day | ORAL | Status: DC

## 2012-01-28 NOTE — Patient Instructions (Signed)
Call if you have any side effects with the Apriso

## 2012-01-28 NOTE — Progress Notes (Signed)
Presenting complaint;  Followup for ulcerative colitis.  Subjective:  Dennis Ray is a 71 year old Caucasian male was here for scheduled visit. He states he is doing well. Since his right, colectomy over a year ago he has had intermittent diarrhea and is using Imodium OTC on when necessary basis. On most days he has formed stools. On today's linear scarring at chin is one loose stool. He denies abdominal pain rectal bleeding or melena. He has very good appetite. He has wondering if he could take another medication with less co-pay.  Current Medications: Current Outpatient Prescriptions  Medication Sig Dispense Refill  . cloNIDine (CATAPRES) 0.2 MG tablet Take 0.2 mg by mouth 2 (two) times daily.      [name] COUMADIN 2 MG tablet TAKE 1 TABLET BY MOUTH DAILY AS DIRECTED.  45 each  1  . fenofibrate 160 MG tablet Take 160 mg by mouth daily.        . furosemide (LASIX) 40 MG tablet Take 40 mg by mouth daily.        . hydrocodone-acetaminophen (LORCET PLUS) 7.5-650 MG per tablet Take 1 tablet by mouth as needed.       [name] lisinopril (PRINIVIL,ZESTRIL) 40 MG tablet Take 40 mg by mouth daily.        [name] loperamide (IMODIUM) 2 MG capsule Take 2 mg by mouth as needed.        . Mesalamine (DELZICOL) 400 MG CPDR Take 400 mg by mouth. Patient takes 2 tablets twice a day      . metoprolol (LOPRESSOR) 50 MG tablet Take 50 mg by mouth 2 (two) times daily.        [name] NITROQUICK 0.4 MG SL tablet DISSOLVE 1 TABLET UNDER TONGUE FOR CHEST PAIN. MAY REPEAT EVERY 5 MINUP TO 3 DOSES-NO RELIEF,CALL 911.  25 each  11  . NORVASC 2.5 MG tablet TAKE ONE TABLET BY MOUTH ONCE DAILY.  30 each  8  . potassium chloride SA (K-DUR,KLOR-CON) 20 MEQ tablet Take 20 mEq by mouth daily.        . simvastatin (ZOCOR) 20 MG tablet Take 20 mg by mouth at bedtime.        [name] terazosin (HYTRIN) 10 MG capsule Take 1 capsule (10 mg total) by mouth as directed. Take 5mg  daily x1 week then increase to 10mg  daily  30 capsule  3  . zolpidem (AMBIEN) 10 MG  tablet Take 10 mg by mouth at bedtime as needed.        Objective: Blood pressure 150/98, pulse 80, temperature 98.4 F (36.9 C), temperature source Oral, resp. rate 20, height 6\' 5"  (1.956 m), weight 291 lb 8 oz (132.224 kg). Patient does not appear to be in any distress. Conjunctiva is pink. Sclera is nonicteric Oropharyngeal mucosa is normal. No neck masses or thyromegaly noted. Cardiac exam with irregular rhythm normal S1 and S2. No murmur or gallop noted. Lungs are clear to auscultation. Abdomen with midline scar. Soft abdomen without tenderness organomegaly or masses. No LE edema or clubbing noted.  Labs/studies Results:  Assessment:  Chronic ulcerative colitis. Status post right hemicolectomy generally 2012 secondary to recurrent GI bleed secondary  to ulcers and history of DALM(none identified on path). He is having intermittent diarrhea secondary to surgery and easily manageable with when necessary use of Imodium.   Plan:  Discontinue Delzicol. Start Apriso 1.5 g by mouth daily. Samples given along with prescription. Unless he has problems he will return for office visit in one year. Surveillance colonoscopy in  January, 2015.

## 2012-02-13 DIAGNOSIS — Z79899 Other long term (current) drug therapy: Secondary | ICD-10-CM | POA: Diagnosis not present

## 2012-02-19 ENCOUNTER — Encounter (HOSPITAL_COMMUNITY): Payer: Self-pay

## 2012-02-19 ENCOUNTER — Encounter (HOSPITAL_COMMUNITY)
Admission: RE | Admit: 2012-02-19 | Discharge: 2012-02-19 | Disposition: A | Discharge status: Home / Self Care | Payer: Medicare Other | Source: Ambulatory Visit | Attending: Ophthalmology | Admitting: Ophthalmology

## 2012-02-19 DIAGNOSIS — I10 Essential (primary) hypertension: Secondary | ICD-10-CM | POA: Diagnosis not present

## 2012-02-19 DIAGNOSIS — I4891 Unspecified atrial fibrillation: Secondary | ICD-10-CM | POA: Diagnosis not present

## 2012-02-19 DIAGNOSIS — H2510 Age-related nuclear cataract, unspecified eye: Secondary | ICD-10-CM | POA: Diagnosis not present

## 2012-02-19 DIAGNOSIS — Z950 Presence of cardiac pacemaker: Secondary | ICD-10-CM | POA: Diagnosis not present

## 2012-02-19 DIAGNOSIS — Z79899 Other long term (current) drug therapy: Secondary | ICD-10-CM | POA: Diagnosis not present

## 2012-02-19 DIAGNOSIS — Z01812 Encounter for preprocedural laboratory examination: Secondary | ICD-10-CM | POA: Diagnosis not present

## 2012-02-19 DIAGNOSIS — G4733 Obstructive sleep apnea (adult) (pediatric): Secondary | ICD-10-CM | POA: Diagnosis not present

## 2012-02-19 HISTORY — DX: Sleep apnea, unspecified: G47.30

## 2012-02-19 HISTORY — DX: Acute myocardial infarction, unspecified: I21.9

## 2012-02-19 LAB — CBC
HCT: 40.6 % (ref 39.0–52.0)
Hemoglobin: 12.3 g/dL — ABNORMAL LOW (ref 13.0–17.0)
RBC: 5.03 MIL/uL (ref 4.22–5.81)
WBC: 6.1 10*3/uL (ref 4.0–10.5)

## 2012-02-19 NOTE — Patient Instructions (Addendum)
19 Dennis Ray  02/19/2012   Your procedure is scheduled on:  02/26/12  Report to [facility] at 06:30 AM.  Call this number if you have problems the morning of surgery: [phone]   Remember:   Do not eat food:After Midnight.  May have clear liquids:until Midnight .  Clear liquids include soda, tea, black coffee, apple or grape juice, broth.  Take these medicines the morning of surgery with A SIP OF WATER: norvasc, clonidine, lisinopril, lopressor   Do not wear jewelry, make-up or nail polish.  Do not wear lotions, powders, or perfumes. You may wear deodorant.  Do not shave 48 hours prior to surgery. Men may shave face and neck.  Do not bring valuables to the hospital.  Contacts, dentures or bridgework may not be worn into surgery.  Leave suitcase in the car. After surgery it may be brought to your room.  For patients admitted to the hospital, checkout time is 11:00 AM the day of discharge.   Patients discharged the day of surgery will not be allowed to drive home.  Name and phone number of your driver:   Special Instructions: N/A   Please read over the following fact sheets that you were given: Anesthesia Post-op Instructions and Care and Recovery After Surgery   Cataract Surgery  A cataract is a clouding of the lens of the eye. When a lens becomes cloudy, vision is reduced based on the degree and nature of the clouding. Surgery may be needed to improve vision. Surgery removes the cloudy lens and usually replaces it with a substitute lens (intraocular lens, IOL). LET YOUR EYE DOCTOR KNOW ABOUT:  Allergies to food or medicine.   Medicines taken including herbs, eyedrops, over-the-counter medicines, and creams.   Use of steroids (by mouth or creams).   Previous problems with anesthetics or numbing medicine.   History of bleeding problems or blood clots.   Previous surgery.   Other health problems, including diabetes and kidney problems.   Possibility of pregnancy, if this  applies.  RISKS AND COMPLICATIONS  Infection.   Inflammation of the eyeball (endophthalmitis) that can spread to both eyes (sympathetic ophthalmia).   Poor wound healing.   If an IOL is inserted, it can later fall out of proper position. This is very uncommon.   Clouding of the part of your eye that holds an IOL in place. This is called an "after-cataract." These are uncommon, but easily treated.  BEFORE THE PROCEDURE  Do not eat or drink anything except small amounts of water for 8 to 12 before your surgery, or as directed by your caregiver.   Unless you are told otherwise, continue any eyedrops you have been prescribed.   Talk to your primary caregiver about all other medicines that you take (both prescription and non-prescription). In some cases, you may need to stop or change medicines near the time of your surgery. This is most important if you are taking blood-thinning medicine.Do not stop medicines unless you are told to do so.   Arrange for someone to drive you to and from the procedure.   Do not put contact lenses in either eye on the day of your surgery.  PROCEDURE There is more than one method for safely removing a cataract. Your doctor can explain the differences and help determine which is best for you. Phacoemulsification surgery is the most common form of cataract surgery.  An injection is given behind the eye or eyedrops are given to make this a  painless procedure.   A small cut (incision) is made on the edge of the clear, dome-shaped surface that covers the front of the eye (cornea).   A tiny probe is painlessly inserted into the eye. This device gives off ultrasound waves that soften and break up the cloudy center of the lens. This makes it easier for the cloudy lens to be removed by suction.   An IOL may be implanted.   The normal lens of the eye is covered by a clear capsule. Part of that capsule is intentionally left in the eye to support the IOL.   Your  surgeon may or may not use stitches to close the incision.  There are other forms of cataract surgery that require a larger incision and stiches to close the eye. This approach is taken in cases where the doctor feels that the cataract cannot be easily removed using phacoemulsification. AFTER THE PROCEDURE  When an IOL is implanted, it does not need care. It becomes a permanent part of your eye and cannot be seen or felt.   Your doctor will schedule follow-up exams to check on your progress.   Review your other medicines with your doctor to see which can be resumed after surgery.   Use eyedrops or take medicine as prescribed by your doctor.  Document Released: 08/23/2011 Document Reviewed: 08/20/2011 [facility] Patient Information 2012 [place], [place].   PATIENT INSTRUCTIONS POST-ANESTHESIA  IMMEDIATELY FOLLOWING SURGERY:  Do not drive or operate machinery for the first twenty four hours after surgery.  Do not make any important decisions for twenty four hours after surgery or while taking narcotic pain medications or sedatives.  If you develop intractable nausea and vomiting or a severe headache please notify your doctor immediately.  FOLLOW-UP:  Please make an appointment with your surgeon as instructed. You do not need to follow up with anesthesia unless specifically instructed to do so.  WOUND CARE INSTRUCTIONS (if applicable):  Keep a dry clean dressing on the anesthesia/puncture wound site if there is drainage.  Once the wound has quit draining you may leave it open to air.  Generally you should leave the bandage intact for twenty four hours unless there is drainage.  If the epidural site drains for more than 36-48 hours please call the anesthesia department.  QUESTIONS?:  Please feel free to call your physician or the hospital operator if you have any questions, and they will be happy to assist you.

## 2012-02-19 NOTE — Progress Notes (Signed)
02/19/12 1147  OBSTRUCTIVE SLEEP APNEA  Have you ever been diagnosed with sleep apnea through a sleep study? No  Do you snore loudly (loud enough to be heard through closed doors)?  0  Do you often feel tired, fatigued, or sleepy during the daytime? 1  Has anyone observed you stop breathing during your sleep? 0  Do you have, or are you being treated for high blood pressure? 1  BMI more than 35 kg/m2? 0  Age over 72 years old? 1  Neck circumference greater than 40 cm/18 inches? 0  Gender: 1  Obstructive Sleep Apnea Score 4   Score 4 or greater  Results sent to PCP;Updated health history

## 2012-02-21 ENCOUNTER — Ambulatory Visit (INDEPENDENT_AMBULATORY_CARE_PROVIDER_SITE_OTHER): Payer: Medicare Other | Admitting: *Deleted

## 2012-02-21 DIAGNOSIS — I4891 Unspecified atrial fibrillation: Secondary | ICD-10-CM | POA: Diagnosis not present

## 2012-02-21 DIAGNOSIS — M545 Low back pain: Secondary | ICD-10-CM | POA: Diagnosis not present

## 2012-02-21 DIAGNOSIS — Z7901 Long term (current) use of anticoagulants: Secondary | ICD-10-CM

## 2012-02-21 DIAGNOSIS — R609 Edema, unspecified: Secondary | ICD-10-CM | POA: Diagnosis not present

## 2012-02-21 DIAGNOSIS — I10 Essential (primary) hypertension: Secondary | ICD-10-CM | POA: Diagnosis not present

## 2012-02-21 LAB — POCT INR: INR: 4.2

## 2012-02-25 ENCOUNTER — Other Ambulatory Visit: Payer: Self-pay | Admitting: Cardiology

## 2012-02-26 ENCOUNTER — Encounter (HOSPITAL_COMMUNITY): Payer: Self-pay | Admitting: Anesthesiology

## 2012-02-26 ENCOUNTER — Ambulatory Visit (HOSPITAL_COMMUNITY): Payer: Medicare Other | Admitting: Anesthesiology

## 2012-02-26 ENCOUNTER — Ambulatory Visit (HOSPITAL_COMMUNITY)
Admission: RE | Admit: 2012-02-26 | Discharge: 2012-02-26 | Disposition: A | Discharge status: Home / Self Care | Payer: Medicare Other | Source: Ambulatory Visit | Attending: Ophthalmology | Admitting: Ophthalmology

## 2012-02-26 ENCOUNTER — Encounter (HOSPITAL_COMMUNITY)
Admission: RE | Disposition: A | Discharge status: Home / Self Care | Payer: Self-pay | Source: Ambulatory Visit | Attending: Ophthalmology

## 2012-02-26 ENCOUNTER — Encounter (HOSPITAL_COMMUNITY): Payer: Self-pay | Admitting: *Deleted

## 2012-02-26 DIAGNOSIS — Z01812 Encounter for preprocedural laboratory examination: Secondary | ICD-10-CM | POA: Insufficient documentation

## 2012-02-26 DIAGNOSIS — I10 Essential (primary) hypertension: Secondary | ICD-10-CM | POA: Insufficient documentation

## 2012-02-26 DIAGNOSIS — Z950 Presence of cardiac pacemaker: Secondary | ICD-10-CM | POA: Diagnosis not present

## 2012-02-26 DIAGNOSIS — G4733 Obstructive sleep apnea (adult) (pediatric): Secondary | ICD-10-CM | POA: Insufficient documentation

## 2012-02-26 DIAGNOSIS — H2510 Age-related nuclear cataract, unspecified eye: Secondary | ICD-10-CM | POA: Diagnosis not present

## 2012-02-26 DIAGNOSIS — I4891 Unspecified atrial fibrillation: Secondary | ICD-10-CM | POA: Insufficient documentation

## 2012-02-26 DIAGNOSIS — Z79899 Other long term (current) drug therapy: Secondary | ICD-10-CM | POA: Insufficient documentation

## 2012-02-26 DIAGNOSIS — H269 Unspecified cataract: Secondary | ICD-10-CM | POA: Diagnosis not present

## 2012-02-26 HISTORY — PX: CATARACT EXTRACTION W/PHACO: SHX586

## 2012-02-26 SURGERY — PHACOEMULSIFICATION, CATARACT, WITH IOL INSERTION
Anesthesia: Monitor Anesthesia Care | Site: Eye | Laterality: Left | Wound class: Clean

## 2012-02-26 MED ORDER — CYCLOPENTOLATE-PHENYLEPHRINE 0.2-1 % OP SOLN
1.0000 [drp] | OPHTHALMIC | Status: AC
  Administered 2012-02-26 (×3): 1 [drp] via OPHTHALMIC

## 2012-02-26 MED ORDER — PROVISC 10 MG/ML IO SOLN
INTRAOCULAR | Status: DC | PRN
  Administered 2012-02-26: 8.5 mg via INTRAOCULAR

## 2012-02-26 MED ORDER — LACTATED RINGERS IV SOLN
INTRAVENOUS | Status: DC
  Administered 2012-02-26: 1000 mL via INTRAVENOUS

## 2012-02-26 MED ORDER — MIDAZOLAM HCL 2 MG/2ML IJ SOLN
INTRAMUSCULAR | Status: AC
  Filled 2012-02-26: qty 2

## 2012-02-26 MED ORDER — PHENYLEPHRINE HCL 2.5 % OP SOLN
1.0000 [drp] | OPHTHALMIC | Status: AC
  Administered 2012-02-26 (×3): 1 [drp] via OPHTHALMIC

## 2012-02-26 MED ORDER — BSS IO SOLN
INTRAOCULAR | Status: DC | PRN
  Administered 2012-02-26: 15 mL via INTRAOCULAR

## 2012-02-26 MED ORDER — LIDOCAINE 3.5 % OP GEL OPTIME - NO CHARGE
OPHTHALMIC | Status: DC | PRN
  Administered 2012-02-26: 2 [drp] via OPHTHALMIC

## 2012-02-26 MED ORDER — EPINEPHRINE HCL 1 MG/ML IJ SOLN
INTRAOCULAR | Status: DC | PRN
  Administered 2012-02-26: 08:00:00

## 2012-02-26 MED ORDER — TETRACAINE HCL 0.5 % OP SOLN
1.0000 [drp] | OPHTHALMIC | Status: AC
  Administered 2012-02-26 (×3): 1 [drp] via OPHTHALMIC

## 2012-02-26 MED ORDER — MIDAZOLAM HCL 2 MG/2ML IJ SOLN
1.0000 mg | INTRAMUSCULAR | Status: DC | PRN
  Administered 2012-02-26: 2 mg via INTRAVENOUS

## 2012-02-26 MED ORDER — EPINEPHRINE HCL 1 MG/ML IJ SOLN
INTRAMUSCULAR | Status: AC
  Filled 2012-02-26: qty 1

## 2012-02-26 MED ORDER — FLURBIPROFEN SODIUM 0.03 % OP SOLN
1.0000 [drp] | OPHTHALMIC | Status: AC
  Administered 2012-02-26 (×3): 1 [drp] via OPHTHALMIC

## 2012-02-26 SURGICAL SUPPLY — 23 items

## 2012-02-26 NOTE — H&P (Signed)
The patient was re examined and there is no change in the patients condition since the original H and P. 

## 2012-02-26 NOTE — Op Note (Signed)
Patient brought to the operating room and prepped and draped in the usual manner.  Lid speculum inserted in left eye.  Stab incision made at the twelve o'clock position.  Provisc instilled in the anterior chamber.   A 2.4 mm. Stab incision was made temporally.  An anterior capsulotomy was done with a bent 25 gauge needle.  The nucleus was hydrodissected.  The Phaco tip was inserted in the anterior chamber and the nucleus was emulsified.  CDE was 8.94.  The cortical material was then removed with the I and A tip.  Posterior capsule was the polished.  The anterior chamber was deepened with Provisc.  A 13.0 Abbott ZMB00 Multifocal IOL was then inserted in the capsular bag.  Provisc was then removed with the I and A tip.  The wound was then hydrated.  Patient sent to the Recovery Room in good condition with follow up in my office.

## 2012-02-26 NOTE — Anesthesia Postprocedure Evaluation (Signed)
  Anesthesia Post-op Note  Patient: Dennis Abbott Sr.  Procedure(s) Performed: Procedure(s) (LRB): CATARACT EXTRACTION PHACO AND INTRAOCULAR LENS PLACEMENT (IOC) (Left)  Patient Location: PACU and Short Stay  Anesthesia Type: MAC  Level of Consciousness: awake, alert , oriented and patient cooperative  Airway and Oxygen Therapy: Patient Spontanous Breathing  Post-op Pain: none  Post-op Assessment: Post-op Vital signs reviewed, Patient's Cardiovascular Status Stable, Respiratory Function Stable, Patent Airway and No signs of Nausea or vomiting  Post-op Vital Signs: Reviewed and stable  Complications: No apparent anesthesia complications

## 2012-02-26 NOTE — Transfer of Care (Signed)
Immediate Anesthesia Transfer of Care Note  Patient: Dennis Abbott Sr.  Procedure(s) Performed: Procedure(s) (LRB): CATARACT EXTRACTION PHACO AND INTRAOCULAR LENS PLACEMENT (IOC) (Left)  Patient Location: PACU and Short Stay  Anesthesia Type: MAC  Level of Consciousness: awake, alert , oriented and patient cooperative  Airway & Oxygen Therapy: Patient Spontanous Breathing  Post-op Assessment: Report given to PACU RN, Post -op Vital signs reviewed and stable and Patient moving all extremities  Post vital signs: Reviewed  Complications: No apparent anesthesia complications

## 2012-02-26 NOTE — Discharge Instructions (Signed)
Dennis Abbott Sr.  02/26/2012           Dennis Ray Care Instructions [address] [place] [address]      1. Avoid closing eyes tightly. One often closes the eye tightly when laughing, talking, sneezing, coughing or if they feel irritated. At these times, you should be careful not to close your eyes tightly.  2. Instill eye drops as instructed. To instill drops in your eye, open it, look up and have someone gently pull the lower lid down and instill a couple of drops inside the lower lid.  3. Do not touch upper lid.  4. Take Advil or Tylenol for pain.  5. You may use either eye for near work, such as reading or sewing and you may watch television.  6. You may have your hair done at the beauty parlor at any time.  7. Wear dark glasses with or without your own glasses if you are in bright light.  8. Call our office at [phone] or [phone] if you have sharp pain in your eye or unusual symptoms.  9. Do not be concerned because vision in the operative eye is not good. It will not be good, no matter how successful the operation, until you get a special lens for it. Your old glasses will not be suited to the new eye that was operated on and you will not be ready for a new lens for about a month.  10. Follow up at the [facility] office.    I have received a copy of the above instructions and will follow them.    PATIENT INSTRUCTIONS POST-ANESTHESIA  IMMEDIATELY FOLLOWING SURGERY:  Do not drive or operate machinery for the first twenty four hours after surgery.  Do not make any important decisions for twenty four hours after surgery or while taking narcotic pain medications or sedatives.  If you develop intractable nausea and vomiting or a severe headache please notify your doctor immediately.  FOLLOW-UP:  Please make an appointment with your surgeon as instructed. You do not need to follow up with anesthesia unless specifically instructed to do  so.  WOUND CARE INSTRUCTIONS (if applicable):  Keep a dry clean dressing on the anesthesia/puncture wound site if there is drainage.  Once the wound has quit draining you may leave it open to air.  Generally you should leave the bandage intact for twenty four hours unless there is drainage.  If the epidural site drains for more than 36-48 hours please call the anesthesia department.  QUESTIONS?:  Please feel free to call your physician or the hospital operator if you have any questions, and they will be happy to assist you.

## 2012-02-26 NOTE — Anesthesia Preprocedure Evaluation (Signed)
Anesthesia Evaluation   Patient awake    Reviewed: Allergy & Precautions, H&P , NPO status , Patient's Chart, lab work & pertinent test results  History of Anesthesia Complications Negative for: history of anesthetic complications  Airway Mallampati: II      Dental  (+) Teeth Intact   Pulmonary sleep apnea ,  breath sounds clear to auscultation        Cardiovascular hypertension, Pt. on medications + CAD and + Past MI + dysrhythmias Atrial Fibrillation + pacemaker Rhythm:Irregular Rate:Normal     Neuro/Psych    GI/Hepatic PUD, GERD-  ,  Endo/Other    Renal/GU      Musculoskeletal   Abdominal   Peds  Hematology   Anesthesia Other Findings   Reproductive/Obstetrics                           Anesthesia Physical Anesthesia Plan  ASA: III  Anesthesia Plan: MAC   Post-op Pain Management:    Induction: Intravenous  Airway Management Planned: Nasal Cannula  Additional Equipment:   Intra-op Plan:   Post-operative Plan:   Informed Consent: I have reviewed the patients History and Physical, chart, labs and discussed the procedure including the risks, benefits and alternatives for the proposed anesthesia with the patient or authorized representative who has indicated his/her understanding and acceptance.     Plan Discussed with:   Anesthesia Plan Comments: (Advised pt about high INR to see his MD and adjust coumadin dose.)        Anesthesia Quick Evaluation

## 2012-02-28 ENCOUNTER — Encounter (HOSPITAL_COMMUNITY): Payer: Self-pay | Admitting: Ophthalmology

## 2012-03-10 ENCOUNTER — Ambulatory Visit (INDEPENDENT_AMBULATORY_CARE_PROVIDER_SITE_OTHER): Payer: Medicare Other | Admitting: *Deleted

## 2012-03-10 DIAGNOSIS — Z7901 Long term (current) use of anticoagulants: Secondary | ICD-10-CM

## 2012-03-10 DIAGNOSIS — I4891 Unspecified atrial fibrillation: Secondary | ICD-10-CM | POA: Diagnosis not present

## 2012-03-10 LAB — POCT INR: INR: 2.7

## 2012-03-25 ENCOUNTER — Other Ambulatory Visit: Payer: Self-pay | Admitting: Cardiology

## 2012-03-31 ENCOUNTER — Ambulatory Visit (INDEPENDENT_AMBULATORY_CARE_PROVIDER_SITE_OTHER): Payer: Medicare Other | Admitting: *Deleted

## 2012-03-31 DIAGNOSIS — Z7901 Long term (current) use of anticoagulants: Secondary | ICD-10-CM | POA: Diagnosis not present

## 2012-03-31 DIAGNOSIS — I4891 Unspecified atrial fibrillation: Secondary | ICD-10-CM

## 2012-03-31 LAB — POCT INR: INR: 2

## 2012-04-17 ENCOUNTER — Other Ambulatory Visit: Payer: Self-pay | Admitting: Cardiology

## 2012-04-21 DIAGNOSIS — M5380 Other specified dorsopathies, site unspecified: Secondary | ICD-10-CM | POA: Diagnosis not present

## 2012-04-21 DIAGNOSIS — M999 Biomechanical lesion, unspecified: Secondary | ICD-10-CM | POA: Diagnosis not present

## 2012-04-22 DIAGNOSIS — M999 Biomechanical lesion, unspecified: Secondary | ICD-10-CM | POA: Diagnosis not present

## 2012-04-22 DIAGNOSIS — M5380 Other specified dorsopathies, site unspecified: Secondary | ICD-10-CM | POA: Diagnosis not present

## 2012-04-23 DIAGNOSIS — M999 Biomechanical lesion, unspecified: Secondary | ICD-10-CM | POA: Diagnosis not present

## 2012-04-23 DIAGNOSIS — M5380 Other specified dorsopathies, site unspecified: Secondary | ICD-10-CM | POA: Diagnosis not present

## 2012-04-28 ENCOUNTER — Ambulatory Visit (INDEPENDENT_AMBULATORY_CARE_PROVIDER_SITE_OTHER): Payer: Medicare Other | Admitting: *Deleted

## 2012-04-28 DIAGNOSIS — I4891 Unspecified atrial fibrillation: Secondary | ICD-10-CM

## 2012-04-28 DIAGNOSIS — Z7901 Long term (current) use of anticoagulants: Secondary | ICD-10-CM | POA: Diagnosis not present

## 2012-05-01 ENCOUNTER — Ambulatory Visit (INDEPENDENT_AMBULATORY_CARE_PROVIDER_SITE_OTHER): Payer: Medicare Other | Admitting: *Deleted

## 2012-05-01 DIAGNOSIS — I4891 Unspecified atrial fibrillation: Secondary | ICD-10-CM | POA: Diagnosis not present

## 2012-05-01 DIAGNOSIS — Z7901 Long term (current) use of anticoagulants: Secondary | ICD-10-CM | POA: Diagnosis not present

## 2012-05-01 LAB — POCT INR: INR: 2.3

## 2012-05-07 ENCOUNTER — Ambulatory Visit (INDEPENDENT_AMBULATORY_CARE_PROVIDER_SITE_OTHER): Payer: Medicare Other | Admitting: *Deleted

## 2012-05-07 DIAGNOSIS — I4891 Unspecified atrial fibrillation: Secondary | ICD-10-CM | POA: Diagnosis not present

## 2012-05-07 DIAGNOSIS — Z7901 Long term (current) use of anticoagulants: Secondary | ICD-10-CM

## 2012-05-14 DIAGNOSIS — Z79899 Other long term (current) drug therapy: Secondary | ICD-10-CM | POA: Diagnosis not present

## 2012-05-16 ENCOUNTER — Ambulatory Visit (INDEPENDENT_AMBULATORY_CARE_PROVIDER_SITE_OTHER): Payer: Medicare Other | Admitting: Adult Health

## 2012-05-16 ENCOUNTER — Encounter: Payer: Self-pay | Admitting: Adult Health

## 2012-05-16 VITALS — BP 157/89 | HR 63 | Ht 77.0 in | Wt 304.0 lb

## 2012-05-16 DIAGNOSIS — I4891 Unspecified atrial fibrillation: Secondary | ICD-10-CM

## 2012-05-16 DIAGNOSIS — I2510 Atherosclerotic heart disease of native coronary artery without angina pectoris: Secondary | ICD-10-CM | POA: Diagnosis not present

## 2012-05-16 DIAGNOSIS — I10 Essential (primary) hypertension: Secondary | ICD-10-CM

## 2012-05-16 NOTE — Progress Notes (Signed)
HPI: Mr. Eichel is a 71 year old patient formerly of Dr. Eden Emms, who presents to the office today on 6 month followup. He has a history of atrial fibrillation requiring chronic anticoagulation for which he is seen by Vashti Hey, RN, in our office. The patient admits to medical noncompliance often forgetting to take his medications. He has gained approximately 20 pounds since being seen last, and does have some minimal fluid retention. Otherwise he is without complaints of chest discomfort, dyspnea on exertion, or elevated heart rate. He normally sleeps on 2 pillows secondary to back pain.  No Known Allergies  Current Outpatient Prescriptions  Medication Sig Dispense Refill  . cloNIDine (CATAPRES) 0.2 MG tablet Take 0.2 mg by mouth 2 (two) times daily.      [name] COUMADIN 2 MG tablet TAKE 1 TABLET BY MOUTH DAILY AS DIRECTED.  30 each  2  . fenofibrate 160 MG tablet Take 160 mg by mouth daily.       . furosemide (LASIX) 40 MG tablet Take 40 mg by mouth daily.        . hydrocodone-acetaminophen (LORCET PLUS) 7.5-650 MG per tablet Take 1 tablet by mouth every 6 (six) hours as needed. For pain      . lisinopril (PRINIVIL,ZESTRIL) 40 MG tablet Take 40 mg by mouth daily.        [name] loperamide (IMODIUM) 2 MG capsule Take 2 mg by mouth as needed.        . Mesalamine (DELZICOL) 400 MG CPDR Take 400 mg by mouth 2 (two) times daily.      . metoprolol (LOPRESSOR) 50 MG tablet Take 50 mg by mouth 2 (two) times daily.        . nitroGLYCERIN (NITROQUICK) 0.4 MG SL tablet Place 0.4 mg under the tongue every 5 (five) minutes x 3 doses as needed.       . NORVASC 2.5 MG tablet TAKE ONE TABLET BY MOUTH ONCE DAILY.  30 each  6  . potassium chloride SA (K-DUR,KLOR-CON) 20 MEQ tablet Take 20 mEq by mouth daily.        . simvastatin (ZOCOR) 20 MG tablet Take 20 mg by mouth at bedtime.        [name] terazosin (HYTRIN) 10 MG capsule Take 10 mg by mouth 2 (two) times daily. Take 5mg  daily x1 week then increase to 10mg  daily      .  zolpidem (AMBIEN) 10 MG tablet Take 10 mg by mouth at bedtime.       [name] DISCONTD: HYTRIN 5 MG capsule TAKE 2 CAPSULES BY MOUTH ONCE DAILY.  60 each  12    Past Medical History  Diagnosis Date  . Hypertension   . Coronary artery disease     IMI in 09/1999->RCA stent, nl LAD, 30%, distal OM, RCA  95% distal lesion; nuclear in 07/2010-moderate-sized Inferior infarction; normal EF by echo in 07/2010  . Atrial flutter     Previously not any Coumadin candidate as the result of chronic GI blood loss  . Hyperlipidemia   . Alcohol abuse, in remission   . Ulcerative colitis     s/p subtotal colectomy  . GERD (gastroesophageal reflux disease)   . Myocardial infarction   . Sleep apnea     STOP BANG score of 4    Past Surgical History  Procedure Date  . Coronary stent placement   . Subtotal colectomy 2012    secondary to ulcerative colitis and LGI bleeding  . Colonoscopy 08/2010  UNDER FLURO  . Colonoscopy 04/02/07    INCOMPLT  . Colonoscopy 12/04/06    INCOMP  . Colonoscopy 09/25/04    ROURK  . Upper gastrointestinal endoscopy 08/18/2010    NUR  . Cataract extraction w/phaco 02/26/2012    Procedure: CATARACT EXTRACTION PHACO AND INTRAOCULAR LENS PLACEMENT (IOC);  Surgeon: Loraine Leriche T. Nile Riggs, MD;  Location: [facility];  Service: Ophthalmology;  Laterality: Left;  CDE 8.94    BMW:UXLKGM of systems complete and found to be negative unless listed above  PHYSICAL EXAM BP 157/89  Pulse 63  Ht 6\' 5"  (1.956 m)  Wt 304 lb (137.893 kg)  BMI 36.05 kg/m2  General: Well developed, well nourished, in no acute distress Head: Eyes PERRLA, No xanthomas.   Normal cephalic and atramatic  Lungs: Clear bilaterally to auscultation and percussion. Heart: HRIR S1 S2, No S3 without MRG.  Pulses are 2+ & equal.            No carotid bruit. No JVD.  No abdominal bruits. No femoral bruits. Abdomen: Bowel sounds are positive, abdomen soft and non-tender without masses or                  Hernia's noted. Msk:   Back normal, normal gait. Normal strength and tone for age. Extremities: No clubbing, cyanosis or edema.  DP +1 Neuro: Alert and oriented X 3. Psych:  Good affect, responds appropriately    ASSESSMENT AND PLAN

## 2012-05-16 NOTE — Assessment & Plan Note (Signed)
He offers no complaints of chest discomfort or dyspnea on exertion despite his weight gain. No stress Myoview planned at this time unless he becomes symptomatic.

## 2012-05-16 NOTE — Assessment & Plan Note (Signed)
Not well controlled at present. He admits to not taking his Lasix or Norvasc as directed. He is on multiple medications to include furosemide, lisinopril, metoprolol 50 mg twice a day, Norvasc 2.5 mg daily all of which are affecting his blood pressure. However it still remains moderately elevated. He has not taken his Lasix for several days. His weight is up 20 pounds in the last 6 months. I have advised him again to take his medications as directed. Wil repeat his echocardiogram for LV function in the setting of weight gain and possible fluid retention although there is no overt indications of this on clinical assessment. He will see [name] again in 6 months unless he becomes symptomatic, or he has a significantly abnormal echocardiogram result. He has had recent labs completed by Dr. Ouida Sills in his office 2 days ago. We have requested those results.

## 2012-05-16 NOTE — Patient Instructions (Addendum)
Your physician recommends that you schedule a follow-up appointment in: 6 months  Your physician has requested that you have an echocardiogram. Echocardiography is a painless test that uses sound waves to create images of your heart. It provides your doctor with information about the size and shape of your heart and how well your heart's chambers and valves are working. This procedure takes approximately one hour. There are no restrictions for this procedure.    

## 2012-05-16 NOTE — Assessment & Plan Note (Signed)
Heart rate is well-controlled. He denies any rapid heart rhythm or palpitation. He is not always compliant with this medication to include anticoagulation. She states that when he follows with Mrs. Azucena Kuba, RN, she also reminds him to take his medications as directed. So far his anticoagulation has been therapeutic. I will repeat his echocardiogram for LV function. Last one was completed in 2011.

## 2012-05-21 DIAGNOSIS — R609 Edema, unspecified: Secondary | ICD-10-CM | POA: Diagnosis not present

## 2012-05-21 DIAGNOSIS — I10 Essential (primary) hypertension: Secondary | ICD-10-CM | POA: Diagnosis not present

## 2012-05-21 DIAGNOSIS — N529 Male erectile dysfunction, unspecified: Secondary | ICD-10-CM | POA: Diagnosis not present

## 2012-05-22 ENCOUNTER — Ambulatory Visit (INDEPENDENT_AMBULATORY_CARE_PROVIDER_SITE_OTHER): Payer: Medicare Other | Admitting: *Deleted

## 2012-05-22 DIAGNOSIS — I4891 Unspecified atrial fibrillation: Secondary | ICD-10-CM

## 2012-05-22 DIAGNOSIS — Z7901 Long term (current) use of anticoagulants: Secondary | ICD-10-CM

## 2012-05-23 ENCOUNTER — Ambulatory Visit (HOSPITAL_COMMUNITY)
Admission: RE | Admit: 2012-05-23 | Discharge: 2012-05-23 | Disposition: A | Discharge status: Home / Self Care | Payer: Medicare Other | Source: Ambulatory Visit | Attending: Adult Health | Admitting: Adult Health

## 2012-05-23 DIAGNOSIS — I4891 Unspecified atrial fibrillation: Secondary | ICD-10-CM | POA: Diagnosis not present

## 2012-05-23 DIAGNOSIS — I517 Cardiomegaly: Secondary | ICD-10-CM | POA: Diagnosis not present

## 2012-05-23 DIAGNOSIS — E785 Hyperlipidemia, unspecified: Secondary | ICD-10-CM | POA: Insufficient documentation

## 2012-05-23 NOTE — Progress Notes (Signed)
  Echocardiogram 2D Echocardiogram has been performed.  Antony Salmon, RCS 05/23/2012, 3:01 PM

## 2012-06-12 ENCOUNTER — Ambulatory Visit (INDEPENDENT_AMBULATORY_CARE_PROVIDER_SITE_OTHER): Payer: Medicare Other | Admitting: *Deleted

## 2012-06-12 DIAGNOSIS — I4891 Unspecified atrial fibrillation: Secondary | ICD-10-CM | POA: Diagnosis not present

## 2012-06-12 DIAGNOSIS — Z7901 Long term (current) use of anticoagulants: Secondary | ICD-10-CM | POA: Diagnosis not present

## 2012-06-19 DIAGNOSIS — N529 Male erectile dysfunction, unspecified: Secondary | ICD-10-CM | POA: Diagnosis not present

## 2012-06-19 DIAGNOSIS — I10 Essential (primary) hypertension: Secondary | ICD-10-CM | POA: Diagnosis not present

## 2012-06-19 DIAGNOSIS — Z0000 Encounter for general adult medical examination without abnormal findings: Secondary | ICD-10-CM | POA: Diagnosis not present

## 2012-06-28 ENCOUNTER — Other Ambulatory Visit: Payer: Self-pay | Admitting: Cardiology

## 2012-07-03 ENCOUNTER — Ambulatory Visit (INDEPENDENT_AMBULATORY_CARE_PROVIDER_SITE_OTHER): Payer: Medicare Other | Admitting: *Deleted

## 2012-07-03 DIAGNOSIS — Z7901 Long term (current) use of anticoagulants: Secondary | ICD-10-CM | POA: Diagnosis not present

## 2012-07-03 DIAGNOSIS — I4891 Unspecified atrial fibrillation: Secondary | ICD-10-CM | POA: Diagnosis not present

## 2012-07-03 LAB — POCT INR: INR: 3.5

## 2012-07-04 ENCOUNTER — Encounter (INDEPENDENT_AMBULATORY_CARE_PROVIDER_SITE_OTHER): Payer: Self-pay

## 2012-07-15 DIAGNOSIS — Z961 Presence of intraocular lens: Secondary | ICD-10-CM | POA: Diagnosis not present

## 2012-07-21 ENCOUNTER — Ambulatory Visit (INDEPENDENT_AMBULATORY_CARE_PROVIDER_SITE_OTHER): Payer: Medicare Other | Admitting: *Deleted

## 2012-07-21 DIAGNOSIS — Z7901 Long term (current) use of anticoagulants: Secondary | ICD-10-CM | POA: Diagnosis not present

## 2012-07-21 DIAGNOSIS — I4891 Unspecified atrial fibrillation: Secondary | ICD-10-CM

## 2012-08-11 ENCOUNTER — Ambulatory Visit (INDEPENDENT_AMBULATORY_CARE_PROVIDER_SITE_OTHER): Payer: Medicare Other | Admitting: *Deleted

## 2012-08-11 DIAGNOSIS — Z7901 Long term (current) use of anticoagulants: Secondary | ICD-10-CM

## 2012-08-11 DIAGNOSIS — I4891 Unspecified atrial fibrillation: Secondary | ICD-10-CM | POA: Diagnosis not present

## 2012-08-13 DIAGNOSIS — L570 Actinic keratosis: Secondary | ICD-10-CM | POA: Diagnosis not present

## 2012-08-13 DIAGNOSIS — L820 Inflamed seborrheic keratosis: Secondary | ICD-10-CM | POA: Diagnosis not present

## 2012-08-13 DIAGNOSIS — Z85828 Personal history of other malignant neoplasm of skin: Secondary | ICD-10-CM | POA: Diagnosis not present

## 2012-08-20 DIAGNOSIS — E785 Hyperlipidemia, unspecified: Secondary | ICD-10-CM | POA: Diagnosis not present

## 2012-08-20 DIAGNOSIS — Z79899 Other long term (current) drug therapy: Secondary | ICD-10-CM | POA: Diagnosis not present

## 2012-08-20 DIAGNOSIS — I4891 Unspecified atrial fibrillation: Secondary | ICD-10-CM | POA: Diagnosis not present

## 2012-08-20 DIAGNOSIS — I10 Essential (primary) hypertension: Secondary | ICD-10-CM | POA: Diagnosis not present

## 2012-08-27 DIAGNOSIS — E785 Hyperlipidemia, unspecified: Secondary | ICD-10-CM | POA: Diagnosis not present

## 2012-08-27 DIAGNOSIS — Z23 Encounter for immunization: Secondary | ICD-10-CM | POA: Diagnosis not present

## 2012-08-27 DIAGNOSIS — I10 Essential (primary) hypertension: Secondary | ICD-10-CM | POA: Diagnosis not present

## 2012-08-27 DIAGNOSIS — M545 Low back pain: Secondary | ICD-10-CM | POA: Diagnosis not present

## 2012-08-30 ENCOUNTER — Encounter: Payer: Self-pay | Admitting: Cardiology

## 2012-09-08 ENCOUNTER — Ambulatory Visit (INDEPENDENT_AMBULATORY_CARE_PROVIDER_SITE_OTHER): Payer: Medicare Other | Admitting: *Deleted

## 2012-09-08 DIAGNOSIS — I4891 Unspecified atrial fibrillation: Secondary | ICD-10-CM | POA: Diagnosis not present

## 2012-09-08 DIAGNOSIS — Z7901 Long term (current) use of anticoagulants: Secondary | ICD-10-CM

## 2012-09-08 LAB — POCT INR: INR: 3

## 2012-09-12 ENCOUNTER — Encounter (INDEPENDENT_AMBULATORY_CARE_PROVIDER_SITE_OTHER): Payer: Self-pay

## 2012-09-19 ENCOUNTER — Other Ambulatory Visit (INDEPENDENT_AMBULATORY_CARE_PROVIDER_SITE_OTHER): Payer: Self-pay | Admitting: Internal Medicine

## 2012-09-19 DIAGNOSIS — L0291 Cutaneous abscess, unspecified: Secondary | ICD-10-CM | POA: Diagnosis not present

## 2012-09-25 DIAGNOSIS — L0390 Cellulitis, unspecified: Secondary | ICD-10-CM | POA: Diagnosis not present

## 2012-09-25 DIAGNOSIS — L0291 Cutaneous abscess, unspecified: Secondary | ICD-10-CM | POA: Diagnosis not present

## 2012-09-26 DIAGNOSIS — S91009A Unspecified open wound, unspecified ankle, initial encounter: Secondary | ICD-10-CM | POA: Diagnosis not present

## 2012-09-26 DIAGNOSIS — Z7901 Long term (current) use of anticoagulants: Secondary | ICD-10-CM | POA: Diagnosis not present

## 2012-09-26 DIAGNOSIS — I10 Essential (primary) hypertension: Secondary | ICD-10-CM | POA: Diagnosis not present

## 2012-09-26 DIAGNOSIS — Z79899 Other long term (current) drug therapy: Secondary | ICD-10-CM | POA: Diagnosis not present

## 2012-09-26 DIAGNOSIS — W278XXA Contact with other nonpowered hand tool, initial encounter: Secondary | ICD-10-CM | POA: Diagnosis not present

## 2012-09-26 DIAGNOSIS — Y93H9 Activity, other involving exterior property and land maintenance, building and construction: Secondary | ICD-10-CM | POA: Diagnosis not present

## 2012-09-26 DIAGNOSIS — I2510 Atherosclerotic heart disease of native coronary artery without angina pectoris: Secondary | ICD-10-CM | POA: Diagnosis not present

## 2012-09-26 DIAGNOSIS — S81009A Unspecified open wound, unspecified knee, initial encounter: Secondary | ICD-10-CM | POA: Diagnosis not present

## 2012-09-29 ENCOUNTER — Encounter (HOSPITAL_COMMUNITY): Payer: Self-pay | Admitting: *Deleted

## 2012-09-29 ENCOUNTER — Emergency Department (HOSPITAL_COMMUNITY)
Admission: EM | Admit: 2012-09-29 | Discharge: 2012-09-29 | Disposition: A | Discharge status: Home / Self Care | Payer: Medicare Other | Attending: Emergency Medicine | Admitting: Emergency Medicine

## 2012-09-29 DIAGNOSIS — R5383 Other fatigue: Secondary | ICD-10-CM | POA: Insufficient documentation

## 2012-09-29 DIAGNOSIS — R5381 Other malaise: Secondary | ICD-10-CM | POA: Insufficient documentation

## 2012-09-29 DIAGNOSIS — R059 Cough, unspecified: Secondary | ICD-10-CM | POA: Insufficient documentation

## 2012-09-29 DIAGNOSIS — Z8679 Personal history of other diseases of the circulatory system: Secondary | ICD-10-CM | POA: Insufficient documentation

## 2012-09-29 DIAGNOSIS — G4733 Obstructive sleep apnea (adult) (pediatric): Secondary | ICD-10-CM | POA: Insufficient documentation

## 2012-09-29 DIAGNOSIS — I10 Essential (primary) hypertension: Secondary | ICD-10-CM | POA: Insufficient documentation

## 2012-09-29 DIAGNOSIS — R1310 Dysphagia, unspecified: Secondary | ICD-10-CM | POA: Diagnosis not present

## 2012-09-29 DIAGNOSIS — E785 Hyperlipidemia, unspecified: Secondary | ICD-10-CM | POA: Insufficient documentation

## 2012-09-29 DIAGNOSIS — R0789 Other chest pain: Secondary | ICD-10-CM | POA: Diagnosis not present

## 2012-09-29 DIAGNOSIS — I252 Old myocardial infarction: Secondary | ICD-10-CM | POA: Insufficient documentation

## 2012-09-29 DIAGNOSIS — Z79899 Other long term (current) drug therapy: Secondary | ICD-10-CM | POA: Insufficient documentation

## 2012-09-29 DIAGNOSIS — Z7901 Long term (current) use of anticoagulants: Secondary | ICD-10-CM | POA: Insufficient documentation

## 2012-09-29 DIAGNOSIS — I2510 Atherosclerotic heart disease of native coronary artery without angina pectoris: Secondary | ICD-10-CM | POA: Insufficient documentation

## 2012-09-29 DIAGNOSIS — F1011 Alcohol abuse, in remission: Secondary | ICD-10-CM | POA: Insufficient documentation

## 2012-09-29 DIAGNOSIS — R05 Cough: Secondary | ICD-10-CM | POA: Insufficient documentation

## 2012-09-29 DIAGNOSIS — Z8719 Personal history of other diseases of the digestive system: Secondary | ICD-10-CM | POA: Insufficient documentation

## 2012-09-29 NOTE — ED Provider Notes (Signed)
History  This chart was scribed for Dennis Gaskins, MD by Ardeen Jourdain, ED Scribe. This patient was seen in room [facility] and the patient's care was started at 0848.  CSN: [id number]  Arrival date & time 09/29/12  0840   First MD Initiated Contact with Patient 09/29/12 0848      Chief Complaint  Patient presents with  . Pain     Patient is a 72 y.o. male presenting with chest pain. The history is provided by the patient. No language interpreter was used.  Chest Pain The chest pain began 3 - 5 days ago. Chest pain occurs intermittently. The chest pain is resolved. The pain is currently at 0/10. The severity of the pain is mild. The quality of the pain is described as pressure-like and tightness. The pain radiates to the epigastrium. Chest pain is worsened by eating. Primary symptoms include fatigue and cough. Pertinent negatives for primary symptoms include no fever, no syncope, no shortness of breath, no wheezing, no palpitations, no abdominal pain, no nausea, no vomiting, no dizziness and no altered mental status.  The fatigue began yesterday.  Pertinent negatives for associated symptoms include no weakness. He tried nothing for the symptoms.     Dennis Ray is a 71 y.o. male who presents to the Emergency Department complaining of CP with associated pain in his esophagus. He denies any symptoms at this time. He states he is here to have an EKG. He states he had the symptoms intermittently 2-3 days ago. He describes the pain as "eating steel potato chops" and it feels like something was caught in his throat. He states the symptoms relieved themselves on Sunday. He states he has been taking doxycycline for a wound to his right lower leg.  He states yesterday he was told to see Dr. Ouida Sills before hours, but was told to come to the ED instead.     Past Medical History  Diagnosis Date  . Hypertension   . Coronary artery disease     IMI in 09/1999->RCA stent, nl LAD, 30%, distal OM,  RCA  95% distal lesion; nuclear in 07/2010-moderate-sized Inferior infarction; normal EF by echo in 07/2010  . Atrial flutter     Previously not any Coumadin candidate as the result of chronic GI blood loss  . Hyperlipidemia   . Alcohol abuse, in remission   . Ulcerative colitis     s/p subtotal colectomy  . GERD (gastroesophageal reflux disease)   . Myocardial infarction   . Sleep apnea     STOP BANG score of 4    Past Surgical History  Procedure Date  . Coronary stent placement   . Subtotal colectomy 2012    secondary to ulcerative colitis and LGI bleeding  . Colonoscopy 08/2010    UNDER FLURO  . Colonoscopy 04/02/07    INCOMPLT  . Colonoscopy 12/04/06    INCOMP  . Colonoscopy 09/25/04    ROURK  . Upper gastrointestinal endoscopy 08/18/2010    NUR  . Cataract extraction w/phaco 02/26/2012    Procedure: CATARACT EXTRACTION PHACO AND INTRAOCULAR LENS PLACEMENT (IOC);  Surgeon: Loraine Leriche T. Nile Riggs, MD;  Location: [facility];  Service: Ophthalmology;  Laterality: Left;  CDE 8.94    Family History  Problem Relation Age of Onset  . Healthy Sister   . Irritable bowel syndrome Daughter   . Anesthesia problems Neg Hx   . Malignant hyperthermia Neg Hx   . Pseudochol deficiency Neg Hx     History  Substance  Use Topics  . Smoking status: Never Smoker   . Smokeless tobacco: Never Used  . Alcohol Use: Yes     Comment: 12oz beer 1-2 times weekly      Review of Systems  Constitutional: Positive for fatigue. Negative for fever.  Respiratory: Positive for cough. Negative for shortness of breath and wheezing.   Cardiovascular: Positive for chest pain. Negative for palpitations and syncope.  Gastrointestinal: Negative for nausea, vomiting, abdominal pain, diarrhea, constipation, blood in stool and anal bleeding.  Neurological: Negative for dizziness and weakness.  Psychiatric/Behavioral: Negative for agitation and altered mental status.  All other systems reviewed and are  negative.    Allergies  Review of patient's allergies indicates no known allergies.  Home Medications   Current Outpatient Rx  Name  Route  Sig  Dispense  Refill  . CLONIDINE HCL 0.2 MG PO TABS   Oral   Take 0.2 mg by mouth 2 (two) times daily.         [name] COUMADIN 2 MG PO TABS      TAKE 1 TABLET BY MOUTH DAILY AS DIRECTED.   30 tablet   3   . DELZICOL 400 MG PO CPDR      TAKE 2 TABLETS BY MOUTH TWICE DAILY.   120 capsule   11   . FENOFIBRATE 160 MG PO TABS   Oral   Take 160 mg by mouth daily.          . FUROSEMIDE 40 MG PO TABS   Oral   Take 40 mg by mouth daily.           [name] HYDROCODONE-ACETAMINOPHEN 7.5-650 MG PO TABS   Oral   Take 1 tablet by mouth every 6 (six) hours as needed. For pain         . LISINOPRIL 40 MG PO TABS   Oral   Take 40 mg by mouth daily.           [name] LOPERAMIDE HCL 2 MG PO CAPS   Oral   Take 2 mg by mouth as needed.           [name] METOPROLOL TARTRATE 50 MG PO TABS   Oral   Take 50 mg by mouth 2 (two) times daily.           [name] NITROGLYCERIN 0.4 MG SL SUBL   Sublingual   Place 0.4 mg under the tongue every 5 (five) minutes x 3 doses as needed.          . NORVASC 2.5 MG PO TABS      TAKE ONE TABLET BY MOUTH ONCE DAILY.   30 each   6   . POTASSIUM CHLORIDE CRYS ER 20 MEQ PO TBCR   Oral   Take 20 mEq by mouth daily.           [name] SIMVASTATIN 20 MG PO TABS   Oral   Take 20 mg by mouth at bedtime.           . TERAZOSIN HCL 10 MG PO CAPS   Oral   Take 10 mg by mouth 2 (two) times daily. Take 5mg  daily x1 week then increase to 10mg  daily         . ZOLPIDEM TARTRATE 10 MG PO TABS   Oral   Take 10 mg by mouth at bedtime.            Triage Vitals: BP 187/95  Pulse 68  Temp 98.7 F (37.1  C) (Oral)  Resp 16  Ht 6\' 5"  (1.956 m)  Wt 290 lb (131.543 kg)  BMI 34.39 kg/m2  SpO2 96%  Physical Exam  CONSTITUTIONAL: Well developed/well nourished HEAD AND FACE: Normocephalic/atraumatic EYES: EOMI/PERRL ENMT:  Mucous membranes moist, uvula midline, voice normal, no stridor. He is handling secretions NECK: supple no meningeal signs SPINE:entire spine nontender CV: Irregular rhythm  LUNGS: Lungs are clear to auscultation bilaterally, no apparent distress ABDOMEN: soft, nontender, no rebound or guarding GU:no cva tenderness NEURO: Pt is awake/alert, moves all extremitiesx4 EXTREMITIES: pulses normal, full ROM, healing wound to right lower extremity  SKIN: warm, color normal PSYCH: no abnormalities of mood noted  ED Course  Procedures   DIAGNOSTIC STUDIES: Oxygen Saturation is 96% on room air, normal by my interpretation.    COORDINATION OF CARE:  9:16 AM: Discussed treatment plan which includes an EKG with pt at bedside and pt agreed to plan.  Pt reports pain essentially with swallowing, now resolved, no other significant symptoms to suggest ACS.  He has no symptoms at present.  I doubt acs/ped/dissection.  He requests d/c home.  I spoke to his PCP Dr Ouida Sills about this case prior to discharge Pt reports similar symptoms previously when taking doxycycline which he has since completed      MDM  Nursing notes including past medical history and social history reviewed and considered in documentation      Date: 09/29/2012  Rate: 72  Rhythm: atrial fibrillation  QRS Axis: normal  Intervals: normal  ST/T Wave abnormalities: nonspecific ST changes  Conduction Disutrbances:none  Narrative Interpretation:   Old EKG Reviewed: unchanged       I personally performed the services described in this documentation, which was scribed in my presence. The recorded information has been reviewed and is accurate.      Dennis Gaskins, MD 09/29/12 [phone]

## 2012-09-29 NOTE — ED Notes (Signed)
Pt states he wants an EKG. States he was started on doxycycline for wound to right lower leg. Pt states he began feeling a pain to esophagus described as "like eating steel potato chips". Pt also stats that his throat felt like something was stuck in his throat. Pt states severe discomfort "no pain" went away on Sunday. Pt is argumentative at times. Denies symptoms at this time.

## 2012-10-03 DIAGNOSIS — W278XXA Contact with other nonpowered hand tool, initial encounter: Secondary | ICD-10-CM | POA: Diagnosis not present

## 2012-10-03 DIAGNOSIS — Z79899 Other long term (current) drug therapy: Secondary | ICD-10-CM | POA: Diagnosis not present

## 2012-10-03 DIAGNOSIS — Y93H9 Activity, other involving exterior property and land maintenance, building and construction: Secondary | ICD-10-CM | POA: Diagnosis not present

## 2012-10-03 DIAGNOSIS — Z7901 Long term (current) use of anticoagulants: Secondary | ICD-10-CM | POA: Diagnosis not present

## 2012-10-03 DIAGNOSIS — I10 Essential (primary) hypertension: Secondary | ICD-10-CM | POA: Diagnosis not present

## 2012-10-03 DIAGNOSIS — S81009A Unspecified open wound, unspecified knee, initial encounter: Secondary | ICD-10-CM | POA: Diagnosis not present

## 2012-10-03 DIAGNOSIS — I2510 Atherosclerotic heart disease of native coronary artery without angina pectoris: Secondary | ICD-10-CM | POA: Diagnosis not present

## 2012-10-03 DIAGNOSIS — S91009A Unspecified open wound, unspecified ankle, initial encounter: Secondary | ICD-10-CM | POA: Diagnosis not present

## 2012-10-13 ENCOUNTER — Ambulatory Visit (INDEPENDENT_AMBULATORY_CARE_PROVIDER_SITE_OTHER): Payer: Medicare Other | Admitting: *Deleted

## 2012-10-13 DIAGNOSIS — I4891 Unspecified atrial fibrillation: Secondary | ICD-10-CM

## 2012-10-13 DIAGNOSIS — Z7901 Long term (current) use of anticoagulants: Secondary | ICD-10-CM | POA: Diagnosis not present

## 2012-10-13 LAB — POCT INR: INR: 4.9

## 2012-10-24 DIAGNOSIS — I872 Venous insufficiency (chronic) (peripheral): Secondary | ICD-10-CM | POA: Diagnosis not present

## 2012-10-24 DIAGNOSIS — L97809 Non-pressure chronic ulcer of other part of unspecified lower leg with unspecified severity: Secondary | ICD-10-CM | POA: Diagnosis not present

## 2012-10-24 DIAGNOSIS — A498 Other bacterial infections of unspecified site: Secondary | ICD-10-CM | POA: Diagnosis not present

## 2012-10-24 DIAGNOSIS — W278XXA Contact with other nonpowered hand tool, initial encounter: Secondary | ICD-10-CM | POA: Diagnosis not present

## 2012-10-24 DIAGNOSIS — IMO0001 Reserved for inherently not codable concepts without codable children: Secondary | ICD-10-CM | POA: Diagnosis not present

## 2012-10-27 ENCOUNTER — Ambulatory Visit (INDEPENDENT_AMBULATORY_CARE_PROVIDER_SITE_OTHER): Payer: Medicare Other | Admitting: *Deleted

## 2012-10-27 DIAGNOSIS — Z7901 Long term (current) use of anticoagulants: Secondary | ICD-10-CM

## 2012-10-27 DIAGNOSIS — I4891 Unspecified atrial fibrillation: Secondary | ICD-10-CM | POA: Diagnosis not present

## 2012-10-27 LAB — POCT INR: INR: 2.1

## 2012-10-31 DIAGNOSIS — M79609 Pain in unspecified limb: Secondary | ICD-10-CM | POA: Diagnosis not present

## 2012-10-31 DIAGNOSIS — A498 Other bacterial infections of unspecified site: Secondary | ICD-10-CM | POA: Diagnosis not present

## 2012-10-31 DIAGNOSIS — M7989 Other specified soft tissue disorders: Secondary | ICD-10-CM | POA: Diagnosis not present

## 2012-10-31 DIAGNOSIS — I872 Venous insufficiency (chronic) (peripheral): Secondary | ICD-10-CM | POA: Diagnosis not present

## 2012-10-31 DIAGNOSIS — W278XXA Contact with other nonpowered hand tool, initial encounter: Secondary | ICD-10-CM | POA: Diagnosis not present

## 2012-10-31 DIAGNOSIS — L97809 Non-pressure chronic ulcer of other part of unspecified lower leg with unspecified severity: Secondary | ICD-10-CM | POA: Diagnosis not present

## 2012-10-31 DIAGNOSIS — L97909 Non-pressure chronic ulcer of unspecified part of unspecified lower leg with unspecified severity: Secondary | ICD-10-CM | POA: Diagnosis not present

## 2012-10-31 DIAGNOSIS — IMO0001 Reserved for inherently not codable concepts without codable children: Secondary | ICD-10-CM | POA: Diagnosis not present

## 2012-11-03 ENCOUNTER — Ambulatory Visit (INDEPENDENT_AMBULATORY_CARE_PROVIDER_SITE_OTHER): Payer: Medicare Other | Admitting: *Deleted

## 2012-11-03 DIAGNOSIS — IMO0001 Reserved for inherently not codable concepts without codable children: Secondary | ICD-10-CM | POA: Diagnosis not present

## 2012-11-03 DIAGNOSIS — I872 Venous insufficiency (chronic) (peripheral): Secondary | ICD-10-CM | POA: Diagnosis not present

## 2012-11-03 DIAGNOSIS — I4891 Unspecified atrial fibrillation: Secondary | ICD-10-CM | POA: Diagnosis not present

## 2012-11-03 DIAGNOSIS — L97809 Non-pressure chronic ulcer of other part of unspecified lower leg with unspecified severity: Secondary | ICD-10-CM | POA: Diagnosis not present

## 2012-11-03 DIAGNOSIS — Z7901 Long term (current) use of anticoagulants: Secondary | ICD-10-CM | POA: Diagnosis not present

## 2012-11-03 DIAGNOSIS — A498 Other bacterial infections of unspecified site: Secondary | ICD-10-CM | POA: Diagnosis not present

## 2012-11-07 DIAGNOSIS — IMO0001 Reserved for inherently not codable concepts without codable children: Secondary | ICD-10-CM | POA: Diagnosis not present

## 2012-11-07 DIAGNOSIS — L97809 Non-pressure chronic ulcer of other part of unspecified lower leg with unspecified severity: Secondary | ICD-10-CM | POA: Diagnosis not present

## 2012-11-07 DIAGNOSIS — I872 Venous insufficiency (chronic) (peripheral): Secondary | ICD-10-CM | POA: Diagnosis not present

## 2012-11-07 DIAGNOSIS — L97909 Non-pressure chronic ulcer of unspecified part of unspecified lower leg with unspecified severity: Secondary | ICD-10-CM | POA: Diagnosis not present

## 2012-11-07 DIAGNOSIS — M7989 Other specified soft tissue disorders: Secondary | ICD-10-CM | POA: Diagnosis not present

## 2012-11-07 DIAGNOSIS — A498 Other bacterial infections of unspecified site: Secondary | ICD-10-CM | POA: Diagnosis not present

## 2012-11-07 DIAGNOSIS — W278XXA Contact with other nonpowered hand tool, initial encounter: Secondary | ICD-10-CM | POA: Diagnosis not present

## 2012-11-07 DIAGNOSIS — E669 Obesity, unspecified: Secondary | ICD-10-CM | POA: Diagnosis not present

## 2012-11-07 DIAGNOSIS — E785 Hyperlipidemia, unspecified: Secondary | ICD-10-CM | POA: Diagnosis not present

## 2012-11-07 DIAGNOSIS — M79609 Pain in unspecified limb: Secondary | ICD-10-CM | POA: Diagnosis not present

## 2012-11-07 DIAGNOSIS — I10 Essential (primary) hypertension: Secondary | ICD-10-CM | POA: Diagnosis not present

## 2012-11-10 ENCOUNTER — Ambulatory Visit (INDEPENDENT_AMBULATORY_CARE_PROVIDER_SITE_OTHER): Payer: Medicare Other | Admitting: *Deleted

## 2012-11-10 DIAGNOSIS — Z7901 Long term (current) use of anticoagulants: Secondary | ICD-10-CM | POA: Diagnosis not present

## 2012-11-10 DIAGNOSIS — I4891 Unspecified atrial fibrillation: Secondary | ICD-10-CM

## 2012-11-10 LAB — POCT INR: INR: 1.9

## 2012-11-14 ENCOUNTER — Encounter: Payer: Self-pay | Admitting: Adult Health

## 2012-11-14 ENCOUNTER — Ambulatory Visit (INDEPENDENT_AMBULATORY_CARE_PROVIDER_SITE_OTHER): Payer: Medicare Other | Admitting: Adult Health

## 2012-11-14 VITALS — BP 174/87 | HR 70 | Ht 77.0 in | Wt 300.0 lb

## 2012-11-14 DIAGNOSIS — W278XXA Contact with other nonpowered hand tool, initial encounter: Secondary | ICD-10-CM | POA: Diagnosis not present

## 2012-11-14 DIAGNOSIS — I4891 Unspecified atrial fibrillation: Secondary | ICD-10-CM

## 2012-11-14 DIAGNOSIS — I10 Essential (primary) hypertension: Secondary | ICD-10-CM | POA: Diagnosis not present

## 2012-11-14 DIAGNOSIS — I872 Venous insufficiency (chronic) (peripheral): Secondary | ICD-10-CM | POA: Diagnosis not present

## 2012-11-14 DIAGNOSIS — I2510 Atherosclerotic heart disease of native coronary artery without angina pectoris: Secondary | ICD-10-CM | POA: Diagnosis not present

## 2012-11-14 DIAGNOSIS — A498 Other bacterial infections of unspecified site: Secondary | ICD-10-CM | POA: Diagnosis not present

## 2012-11-14 DIAGNOSIS — L97809 Non-pressure chronic ulcer of other part of unspecified lower leg with unspecified severity: Secondary | ICD-10-CM | POA: Diagnosis not present

## 2012-11-14 DIAGNOSIS — IMO0001 Reserved for inherently not codable concepts without codable children: Secondary | ICD-10-CM | POA: Diagnosis not present

## 2012-11-14 MED ORDER — LISINOPRIL 20 MG PO TABS: ORAL_TABLET | ORAL | Status: DC

## 2012-11-14 NOTE — Patient Instructions (Addendum)
Your physician recommends that you schedule a follow-up appointment in:  1 - 3 months 2 - Blood pressure check in 1 week  Your physician has recommended you make the following change in your medication:  1 - INCREASE Lisinopril to 40 mg in am and 20 mg in the pm  Your physician recommends that you return for lab work in: 1 week

## 2012-11-14 NOTE — Addendum Note (Signed)
Addended by: Reather Laurence A on: 11/14/2012 02:31 PM   Modules accepted: Orders

## 2012-11-14 NOTE — Progress Notes (Signed)
Name: Dennis Ray    DOB: 12-10-1940  Age: 72 y.o.  MR#: [id number]       PCP:  Carylon Perches, MD      Insurance: Payor: MEDICARE  Plan: MEDICARE PART A AND B  Product Type: *No Product type*    CC:   No chief complaint on file.   VS Filed Vitals:   11/14/12 1351  BP: 174/87  Pulse: 70  Height: 6\' 5"  (1.956 m)  Weight: 300 lb (136.079 kg)  SpO2: 96%    Weights Current Weight  11/14/12 300 lb (136.079 kg)  09/29/12 290 lb (131.543 kg)  05/16/12 304 lb (137.893 kg)    Blood Pressure  BP Readings from Last 3 Encounters:  11/14/12 174/87  09/29/12 187/95  05/16/12 157/89     Admit date:  (Not on file) Last encounter with RMR:  Visit date not found   Allergy Doxycycline  Current Outpatient Prescriptions  Medication Sig Dispense Refill  . cloNIDine (CATAPRES) 0.2 MG tablet Take 0.2 mg by mouth 2 (two) times daily.      [name] COUMADIN 2 MG tablet TAKE 1 TABLET BY MOUTH DAILY AS DIRECTED.  30 tablet  3  . DELZICOL 400 MG CPDR TAKE 2 TABLETS BY MOUTH TWICE DAILY.  120 capsule  11  . fenofibrate 160 MG tablet Take 160 mg by mouth daily.       . furosemide (LASIX) 40 MG tablet Take 40 mg by mouth daily.        . hydrocodone-acetaminophen (LORCET PLUS) 7.5-650 MG per tablet Take 1 tablet by mouth every 6 (six) hours as needed. For pain      . lisinopril (PRINIVIL,ZESTRIL) 40 MG tablet Take 40 mg by mouth daily.        [name] loperamide (IMODIUM) 2 MG capsule Take 2 mg by mouth as needed.        . metoprolol (LOPRESSOR) 50 MG tablet Take 50 mg by mouth 2 (two) times daily.        . nitroGLYCERIN (NITROQUICK) 0.4 MG SL tablet Place 0.4 mg under the tongue every 5 (five) minutes x 3 doses as needed.       . NORVASC 2.5 MG tablet TAKE ONE TABLET BY MOUTH ONCE DAILY.  30 each  6  . potassium chloride SA (K-DUR,KLOR-CON) 20 MEQ tablet Take 20 mEq by mouth daily.        . simvastatin (ZOCOR) 20 MG tablet Take 20 mg by mouth at bedtime.        [name] terazosin (HYTRIN) 10 MG capsule Take 10 mg by mouth 2  (two) times daily. Take 5mg  daily x1 week then increase to 10mg  daily      . zolpidem (AMBIEN) 10 MG tablet Take 10 mg by mouth at bedtime.        No current facility-administered medications for this visit.    Discontinued Meds:   There are no discontinued medications.  Patient Active Problem List  Diagnosis  . Hyperlipidemia  . Hypertension  . Coronary artery disease  . Atrial fibrillation  . Ulcerative colitis  . Chronic anticoagulation  . GERD (gastroesophageal reflux disease)  . Alcohol abuse, in remission    LABS    Component Value Date/Time   NA 139 10/17/2010 0538   NA 135 10/15/2010 0448   NA 137 10/14/2010 0527   K 3.9 10/17/2010 0538   K 3.0* 10/15/2010 0448   K 3.4* 10/14/2010 0527   CL 114* 10/17/2010 0538   CL 106  10/15/2010 0448   CL 103 DELTA CHECK NOTED 10/14/2010 0527   CO2 19 10/17/2010 0538   CO2 23 10/15/2010 0448   CO2 25 10/14/2010 0527   GLUCOSE 91 10/17/2010 0538   GLUCOSE 109* 10/15/2010 0448   GLUCOSE 108* 10/14/2010 0527   BUN 22 10/17/2010 0538   BUN 42* 10/15/2010 0448   BUN 50* 10/14/2010 0527   CREATININE 1.07 10/17/2010 0538   CREATININE 1.11 10/15/2010 0448   CREATININE 1.55 DELTA CHECK NOTED* 10/14/2010 0527   CALCIUM 7.6* 10/17/2010 0538   CALCIUM 7.5* 10/15/2010 0448   CALCIUM 7.9* 10/14/2010 0527   GFRNONAA >60 10/17/2010 0538   GFRNONAA >60 10/15/2010 0448   GFRNONAA 45* 10/14/2010 0527   GFRAA  Value: >60        The eGFR has been calculated using the MDRD equation. This calculation has not been validated in all clinical situations. eGFR's persistently <60 mL/min signify possible Chronic Kidney Disease. 10/17/2010 0538   GFRAA  Value: >60        The eGFR has been calculated using the MDRD equation. This calculation has not been validated in all clinical situations. eGFR's persistently <60 mL/min signify possible Chronic Kidney Disease. 10/15/2010 0448   GFRAA  Value: 54        The eGFR has been calculated using the MDRD equation. This calculation has not  been validated in all clinical situations. eGFR's persistently <60 mL/min signify possible Chronic Kidney Disease.* 10/14/2010 0527   CMP     Component Value Date/Time   NA 139 10/17/2010 0538   K 3.9 10/17/2010 0538   CL 114* 10/17/2010 0538   CO2 19 10/17/2010 0538   GLUCOSE 91 10/17/2010 0538   BUN 22 10/17/2010 0538   CREATININE 1.07 10/17/2010 0538   CALCIUM 7.6* 10/17/2010 0538   PROT 5.7* 10/13/2010 0042   ALBUMIN 2.2* 10/13/2010 0042   AST 19 10/13/2010 0042   ALT 18 10/13/2010 0042   ALKPHOS 37* 10/13/2010 0042   BILITOT 0.4 10/13/2010 0042   GFRNONAA >60 10/17/2010 0538   GFRAA  Value: >60        The eGFR has been calculated using the MDRD equation. This calculation has not been validated in all clinical situations. eGFR's persistently <60 mL/min signify possible Chronic Kidney Disease. 10/17/2010 0538       Component Value Date/Time   WBC 6.1 02/19/2012 1145   WBC 11.3* 10/17/2010 0538   WBC 10.4 10/15/2010 0448   HGB 12.3* 02/19/2012 1145   HGB 12.6* 02/26/2011   HGB 8.1* 10/17/2010 0538   HCT 40.6 02/19/2012 1145   HCT 41 02/26/2011   HCT 26.2* 10/17/2010 0538   MCV 80.7 02/19/2012 1145   MCV 79.4 10/17/2010 0538   MCV 78.4 10/15/2010 0448    Lipid Panel  No results found for this basename: chol, trig, hdl, cholhdl, vldl, ldlcalc    ABG No results found for this basename: phart, pco2, pco2art, po2, po2art, hco3, tco2, acidbasedef, o2sat     No results found for this basename: TSH   BNP (last 3 results) No results found for this basename: PROBNP,  in the last 8760 hours Cardiac Panel (last 3 results) No results found for this basename: CKTOTAL, CKMB, TROPONINI, RELINDX,  in the last 72 hours  Iron/TIBC/Ferritin No results found for this basename: iron, tibc, ferritin     EKG Orders placed during the hospital encounter of 09/29/12  . ED EKG  . ED EKG  . EKG 12-LEAD  .  EKG 12-LEAD  . EKG     Prior Assessment and Plan Problem List as of 11/14/2012     ICD-9-CM    Hyperlipidemia   Last Assessment & Plan   11/16/2011 Office Visit Written 11/16/2011 11:49 AM by Jodelle Gross, NP     Labs are recently drawn by Dr. Ouida Sills one week ago. I do not see results yet.  Will request results.    Hypertension   Last Assessment & Plan   05/16/2012 Office Visit Written 05/16/2012  2:19 PM by Jodelle Gross, NP     Not well controlled at present. He admits to not taking his Lasix or Norvasc as directed. He is on multiple medications to include furosemide, lisinopril, metoprolol 50 mg twice a day, Norvasc 2.5 mg daily all of which are affecting his blood pressure. However it still remains moderately elevated. He has not taken his Lasix for several days. His weight is up 20 pounds in the last 6 months. I have advised him again to take his medications as directed. Wil repeat his echocardiogram for LV function in the setting of weight gain and possible fluid retention although there is no overt indications of this on clinical assessment. He will see [name] again in 6 months unless he becomes symptomatic, or he has a significantly abnormal echocardiogram result. He has had recent labs completed by Dr. Ouida Sills in his office 2 days ago. We have requested those results.    Coronary artery disease   Last Assessment & Plan   05/16/2012 Office Visit Written 05/16/2012  2:19 PM by Jodelle Gross, NP     He offers no complaints of chest discomfort or dyspnea on exertion despite his weight gain. No stress Myoview planned at this time unless he becomes symptomatic.    Atrial fibrillation   Last Assessment & Plan   05/16/2012 Office Visit Written 05/16/2012  2:16 PM by Jodelle Gross, NP     Heart rate is well-controlled. He denies any rapid heart rhythm or palpitation. He is not always compliant with this medication to include anticoagulation. She states that when he follows with Mrs. Azucena Kuba, RN, she also reminds him to take his medications as directed. So far his anticoagulation has been  therapeutic. I will repeat his echocardiogram for LV function. Last one was completed in 2011.    Ulcerative colitis   Chronic anticoagulation   Last Assessment & Plan   02/19/2011 Office Visit Written 02/19/2011  4:49 PM by Kathlen Brunswick, MD     Hemoccult testing and serial assessment of CBCs will be performed to exclude occult GI blood loss.    GERD (gastroesophageal reflux disease)   Alcohol abuse, in remission       Imaging: No results found.

## 2012-11-14 NOTE — Assessment & Plan Note (Addendum)
Blood pressure is not well controlled again. It may be from not taking his lasix today, but still would not be optimal for diabetic patient. Will go up on lisinopril to 40 mg in pm and 20 mg at pm from daily. He is advised to take his medications at the same time daily as directed without missing doses., Consider increasing amlodipine if not well controlled. Will repeat BMET in 1 week after going up on the dose.

## 2012-11-14 NOTE — Assessment & Plan Note (Signed)
Heart rate is well controlled at present. No changes in medications. Sees our coumadin clinic as scheduled.

## 2012-11-14 NOTE — Assessment & Plan Note (Signed)
No cardiac complaints at this time. No testing planned.

## 2012-11-14 NOTE — Progress Notes (Signed)
HPI:Dennis Ray is a 72 year old patient formerly of Dr. Eden Emms, who presents to the office today on 6 month followup. He has a history of atrial fibrillation requiring chronic anticoagulation for which he is seen by Vashti Hey, RN, in our office. The patient admits to medical noncompliance often forgetting to take his medications.His wt was up 20 lbs on last visit with BP not well controlled. I ordered echocardiogram on last visit.   Echo demonstrated EF of 60% with mild LVH.  AoV was mildly to moderately calcified, with mildy dilated LA. Labs are reviewed and are WNL. Since last seen he has had an injury to his right leg and is going to the wound center in [place]. He is not satisfied with treatment there and is requesting second opinion from a wound center in [place]. Sees Dr. Ouida Sills with doppler studies.    Admits to eating salty foods, and sometimes forgets to take his medications. Has not taken lasix today due to appointments. He is allergic to DOXYCYCLOMINE.   Allergies  Allergen Reactions  . Doxycycline     THROAT SWELLS    Current Outpatient Prescriptions  Medication Sig Dispense Refill  . cloNIDine (CATAPRES) 0.2 MG tablet Take 0.2 mg by mouth 2 (two) times daily.      [name] COUMADIN 2 MG tablet TAKE 1 TABLET BY MOUTH DAILY AS DIRECTED.  30 tablet  3  . DELZICOL 400 MG CPDR TAKE 2 TABLETS BY MOUTH TWICE DAILY.  120 capsule  11  . fenofibrate 160 MG tablet Take 160 mg by mouth daily.       . furosemide (LASIX) 40 MG tablet Take 40 mg by mouth daily.        . hydrocodone-acetaminophen (LORCET PLUS) 7.5-650 MG per tablet Take 1 tablet by mouth every 6 (six) hours as needed. For pain      . lisinopril (PRINIVIL,ZESTRIL) 40 MG tablet Take 40 mg by mouth daily.        [name] loperamide (IMODIUM) 2 MG capsule Take 2 mg by mouth as needed.        . metoprolol (LOPRESSOR) 50 MG tablet Take 50 mg by mouth 2 (two) times daily.        . nitroGLYCERIN (NITROQUICK) 0.4 MG SL tablet Place 0.4 mg under the  tongue every 5 (five) minutes x 3 doses as needed.       . NORVASC 2.5 MG tablet TAKE ONE TABLET BY MOUTH ONCE DAILY.  30 each  6  . potassium chloride SA (K-DUR,KLOR-CON) 20 MEQ tablet Take 20 mEq by mouth daily.        . simvastatin (ZOCOR) 20 MG tablet Take 20 mg by mouth at bedtime.        [name] terazosin (HYTRIN) 10 MG capsule Take 10 mg by mouth 2 (two) times daily. Take 5mg  daily x1 week then increase to 10mg  daily      . zolpidem (AMBIEN) 10 MG tablet Take 10 mg by mouth at bedtime.        No current facility-administered medications for this visit.    Past Medical History  Diagnosis Date  . Hypertension   . Coronary artery disease     IMI in 09/1999->RCA stent, nl LAD, 30%, distal OM, RCA  95% distal lesion; nuclear in 07/2010-moderate-sized Inferior infarction; normal EF by echo in 07/2010  . Atrial flutter     Previously not any Coumadin candidate as the result of chronic GI blood loss  . Hyperlipidemia   .  Alcohol abuse, in remission   . Ulcerative colitis     s/p subtotal colectomy  . GERD (gastroesophageal reflux disease)   . Myocardial infarction   . Sleep apnea     STOP BANG score of 4    Past Surgical History  Procedure Laterality Date  . Coronary stent placement    . Subtotal colectomy  2012    secondary to ulcerative colitis and LGI bleeding  . Colonoscopy  08/2010    UNDER FLURO  . Colonoscopy  04/02/07    INCOMPLT  . Colonoscopy  12/04/06    INCOMP  . Colonoscopy  09/25/04    ROURK  . Upper gastrointestinal endoscopy  08/18/2010    NUR  . Cataract extraction w/phaco  02/26/2012    Procedure: CATARACT EXTRACTION PHACO AND INTRAOCULAR LENS PLACEMENT (IOC);  Surgeon: Loraine Leriche T. Nile Riggs, MD;  Location: [facility];  Service: Ophthalmology;  Laterality: Left;  CDE 8.94    MVH:QIONGE of systems complete and found to be negative unless listed above  PHYSICAL EXAM General: Well developed, well nourished, in no acute distress Head: Eyes PERRLA, No xanthomas.   Normal  cephalic and atramatic  Lungs: Clear bilaterally to auscultation and percussion. Heart: HRiR S1 S2, without MRG.  Pulses are 2+ & equal.            No carotid bruit. No JVD.  No abdominal bruits. No femoral bruits. Abdomen: Bowel sounds are positive, abdomen soft and non-tender without masses or                  Hernia's noted. Msk:  Back normal, normal gait. Normal strength and tone for age. Right leg wrapped with ACE wrap.  Extremities: No clubbing, cyanosis or edema.  DP +1 Neuro: Alert and oriented X 3. Psych:  Good affect, responds appropriately   ASSESSMENT AND PLAN

## 2012-11-24 ENCOUNTER — Encounter (HOSPITAL_BASED_OUTPATIENT_CLINIC_OR_DEPARTMENT_OTHER): Payer: Medicare Other | Attending: General Surgery

## 2012-11-24 DIAGNOSIS — I872 Venous insufficiency (chronic) (peripheral): Secondary | ICD-10-CM | POA: Insufficient documentation

## 2012-11-24 DIAGNOSIS — I4891 Unspecified atrial fibrillation: Secondary | ICD-10-CM | POA: Diagnosis not present

## 2012-11-24 DIAGNOSIS — L97809 Non-pressure chronic ulcer of other part of unspecified lower leg with unspecified severity: Secondary | ICD-10-CM | POA: Diagnosis not present

## 2012-11-24 DIAGNOSIS — K5190 Ulcerative colitis, unspecified, without complications: Secondary | ICD-10-CM | POA: Diagnosis not present

## 2012-11-24 DIAGNOSIS — I10 Essential (primary) hypertension: Secondary | ICD-10-CM | POA: Insufficient documentation

## 2012-11-24 DIAGNOSIS — I2510 Atherosclerotic heart disease of native coronary artery without angina pectoris: Secondary | ICD-10-CM | POA: Insufficient documentation

## 2012-11-24 DIAGNOSIS — Z79899 Other long term (current) drug therapy: Secondary | ICD-10-CM | POA: Diagnosis not present

## 2012-11-25 NOTE — Progress Notes (Signed)
[facility]  NAME:  Dennis Ray, CLEAR NO.:  [id number]  MEDICAL RECORD NO.:  [id number]      DATE OF BIRTH:  [birth year]  PHYSICIAN:  Ardath Sax, M.D.           VISIT DATE:                                  OFFICE VISIT   A 72 year old man who is 6 foot 5, weighs 300 pounds.  He has a history of heart disease, atrial fibrillation, and venous stasis of his legs. On examination when he came here, his blood pressure 157/81, respirations 18, pulse 76, temperature 98.6.  This man has a history of injuring his right leg on a log while he was cutting down a tree and since that time, he has had this nonhealing wound about 2 cm in diameter on the medial side of the right leg.  He has a history of hypertension, heart disease, ulcerative colitis, atrial fibrillation, and anemia.  He is on K-Dur, Lopressor, Lasix, and he also has a history of having a stent placed in 1 of his coronary vessels.  On examination, he has a clean wound that has been taking care of by Wound Center in [place] with some sort of wrap.  Today, we looked at it and we put on silver collagen and an Radio broadcast assistant.  This man has an excellent palpable pulse. He does have the typical skin changes of chronic venous hypertension and stasis, so we are calling this a venous hypertension, chronic venous ulcer right leg, also history of coronary artery disease, atrial fibrillation, hypertension, ulcerative colitis.     Ardath Sax, M.D.     PP/MEDQ  D:  11/24/2012  T:  11/25/2012  Job:  [id number]

## 2012-12-01 ENCOUNTER — Ambulatory Visit (INDEPENDENT_AMBULATORY_CARE_PROVIDER_SITE_OTHER): Payer: Medicare Other | Admitting: *Deleted

## 2012-12-01 DIAGNOSIS — I4891 Unspecified atrial fibrillation: Secondary | ICD-10-CM | POA: Diagnosis not present

## 2012-12-01 DIAGNOSIS — Z7901 Long term (current) use of anticoagulants: Secondary | ICD-10-CM | POA: Diagnosis not present

## 2012-12-03 ENCOUNTER — Other Ambulatory Visit: Payer: Self-pay | Admitting: *Deleted

## 2012-12-03 DIAGNOSIS — I87319 Chronic venous hypertension (idiopathic) with ulcer of unspecified lower extremity: Secondary | ICD-10-CM | POA: Diagnosis not present

## 2012-12-03 DIAGNOSIS — L97809 Non-pressure chronic ulcer of other part of unspecified lower leg with unspecified severity: Secondary | ICD-10-CM | POA: Diagnosis not present

## 2012-12-03 DIAGNOSIS — I872 Venous insufficiency (chronic) (peripheral): Secondary | ICD-10-CM | POA: Diagnosis not present

## 2012-12-03 DIAGNOSIS — Z79899 Other long term (current) drug therapy: Secondary | ICD-10-CM | POA: Diagnosis not present

## 2012-12-03 DIAGNOSIS — I2510 Atherosclerotic heart disease of native coronary artery without angina pectoris: Secondary | ICD-10-CM | POA: Diagnosis not present

## 2012-12-03 MED ORDER — WARFARIN SODIUM 2 MG PO TABS: 2.0000 mg | ORAL_TABLET | Freq: Every day | ORAL | Status: DC

## 2012-12-09 ENCOUNTER — Encounter: Payer: Self-pay | Admitting: *Deleted

## 2012-12-09 ENCOUNTER — Other Ambulatory Visit: Payer: Self-pay | Admitting: *Deleted

## 2012-12-09 DIAGNOSIS — I2510 Atherosclerotic heart disease of native coronary artery without angina pectoris: Secondary | ICD-10-CM

## 2012-12-09 DIAGNOSIS — I10 Essential (primary) hypertension: Secondary | ICD-10-CM

## 2012-12-09 DIAGNOSIS — I4891 Unspecified atrial fibrillation: Secondary | ICD-10-CM

## 2012-12-10 DIAGNOSIS — L97809 Non-pressure chronic ulcer of other part of unspecified lower leg with unspecified severity: Secondary | ICD-10-CM | POA: Diagnosis not present

## 2012-12-10 DIAGNOSIS — I872 Venous insufficiency (chronic) (peripheral): Secondary | ICD-10-CM | POA: Diagnosis not present

## 2012-12-10 DIAGNOSIS — Z79899 Other long term (current) drug therapy: Secondary | ICD-10-CM | POA: Diagnosis not present

## 2012-12-10 DIAGNOSIS — I2510 Atherosclerotic heart disease of native coronary artery without angina pectoris: Secondary | ICD-10-CM | POA: Diagnosis not present

## 2012-12-11 ENCOUNTER — Ambulatory Visit (INDEPENDENT_AMBULATORY_CARE_PROVIDER_SITE_OTHER): Payer: Medicare Other | Admitting: *Deleted

## 2012-12-11 DIAGNOSIS — I4891 Unspecified atrial fibrillation: Secondary | ICD-10-CM

## 2012-12-11 DIAGNOSIS — Z7901 Long term (current) use of anticoagulants: Secondary | ICD-10-CM | POA: Diagnosis not present

## 2012-12-11 DIAGNOSIS — Z79899 Other long term (current) drug therapy: Secondary | ICD-10-CM | POA: Diagnosis not present

## 2012-12-11 LAB — POCT INR: INR: 2.4

## 2012-12-17 ENCOUNTER — Encounter (HOSPITAL_BASED_OUTPATIENT_CLINIC_OR_DEPARTMENT_OTHER): Payer: Medicare Other | Attending: General Surgery

## 2012-12-17 DIAGNOSIS — M545 Low back pain: Secondary | ICD-10-CM | POA: Diagnosis not present

## 2012-12-17 DIAGNOSIS — I872 Venous insufficiency (chronic) (peripheral): Secondary | ICD-10-CM | POA: Insufficient documentation

## 2012-12-17 DIAGNOSIS — I10 Essential (primary) hypertension: Secondary | ICD-10-CM | POA: Diagnosis not present

## 2012-12-17 DIAGNOSIS — L97809 Non-pressure chronic ulcer of other part of unspecified lower leg with unspecified severity: Secondary | ICD-10-CM | POA: Insufficient documentation

## 2012-12-17 DIAGNOSIS — I87309 Chronic venous hypertension (idiopathic) without complications of unspecified lower extremity: Secondary | ICD-10-CM | POA: Diagnosis not present

## 2012-12-17 DIAGNOSIS — R609 Edema, unspecified: Secondary | ICD-10-CM | POA: Diagnosis not present

## 2012-12-24 DIAGNOSIS — I872 Venous insufficiency (chronic) (peripheral): Secondary | ICD-10-CM | POA: Diagnosis not present

## 2012-12-24 DIAGNOSIS — L97809 Non-pressure chronic ulcer of other part of unspecified lower leg with unspecified severity: Secondary | ICD-10-CM | POA: Diagnosis not present

## 2012-12-24 DIAGNOSIS — I87309 Chronic venous hypertension (idiopathic) without complications of unspecified lower extremity: Secondary | ICD-10-CM | POA: Diagnosis not present

## 2012-12-31 DIAGNOSIS — I87309 Chronic venous hypertension (idiopathic) without complications of unspecified lower extremity: Secondary | ICD-10-CM | POA: Diagnosis not present

## 2012-12-31 DIAGNOSIS — I872 Venous insufficiency (chronic) (peripheral): Secondary | ICD-10-CM | POA: Diagnosis not present

## 2012-12-31 DIAGNOSIS — L97809 Non-pressure chronic ulcer of other part of unspecified lower leg with unspecified severity: Secondary | ICD-10-CM | POA: Diagnosis not present

## 2013-01-05 ENCOUNTER — Encounter: Payer: Self-pay | Admitting: *Deleted

## 2013-01-05 ENCOUNTER — Ambulatory Visit (INDEPENDENT_AMBULATORY_CARE_PROVIDER_SITE_OTHER): Payer: Medicare Other | Admitting: *Deleted

## 2013-01-05 DIAGNOSIS — I4891 Unspecified atrial fibrillation: Secondary | ICD-10-CM

## 2013-01-05 DIAGNOSIS — Z7901 Long term (current) use of anticoagulants: Secondary | ICD-10-CM | POA: Diagnosis not present

## 2013-01-05 LAB — POCT INR: INR: 2.2

## 2013-01-07 DIAGNOSIS — I87309 Chronic venous hypertension (idiopathic) without complications of unspecified lower extremity: Secondary | ICD-10-CM | POA: Diagnosis not present

## 2013-01-07 DIAGNOSIS — L97809 Non-pressure chronic ulcer of other part of unspecified lower leg with unspecified severity: Secondary | ICD-10-CM | POA: Diagnosis not present

## 2013-01-07 DIAGNOSIS — I872 Venous insufficiency (chronic) (peripheral): Secondary | ICD-10-CM | POA: Diagnosis not present

## 2013-01-14 DIAGNOSIS — L97809 Non-pressure chronic ulcer of other part of unspecified lower leg with unspecified severity: Secondary | ICD-10-CM | POA: Diagnosis not present

## 2013-01-14 DIAGNOSIS — I872 Venous insufficiency (chronic) (peripheral): Secondary | ICD-10-CM | POA: Diagnosis not present

## 2013-01-14 DIAGNOSIS — I87309 Chronic venous hypertension (idiopathic) without complications of unspecified lower extremity: Secondary | ICD-10-CM | POA: Diagnosis not present

## 2013-01-22 ENCOUNTER — Encounter (INDEPENDENT_AMBULATORY_CARE_PROVIDER_SITE_OTHER): Payer: Self-pay | Admitting: *Deleted

## 2013-02-02 ENCOUNTER — Ambulatory Visit (INDEPENDENT_AMBULATORY_CARE_PROVIDER_SITE_OTHER): Payer: Medicare Other | Admitting: *Deleted

## 2013-02-02 DIAGNOSIS — I4891 Unspecified atrial fibrillation: Secondary | ICD-10-CM | POA: Diagnosis not present

## 2013-02-02 DIAGNOSIS — Z7901 Long term (current) use of anticoagulants: Secondary | ICD-10-CM

## 2013-02-02 LAB — POCT INR: INR: 1.9

## 2013-02-03 ENCOUNTER — Ambulatory Visit (INDEPENDENT_AMBULATORY_CARE_PROVIDER_SITE_OTHER): Payer: Medicare Other | Admitting: Internal Medicine

## 2013-02-03 ENCOUNTER — Encounter (INDEPENDENT_AMBULATORY_CARE_PROVIDER_SITE_OTHER): Payer: Self-pay | Admitting: Internal Medicine

## 2013-02-03 VITALS — BP 150/80 | HR 76 | Ht 77.0 in | Wt 294.5 lb

## 2013-02-03 DIAGNOSIS — K5120 Ulcerative (chronic) proctitis without complications: Secondary | ICD-10-CM

## 2013-02-03 LAB — CBC WITH DIFFERENTIAL/PLATELET
Basophils Absolute: 0.1 10*3/uL (ref 0.0–0.1)
Basophils Relative: 2 % — ABNORMAL HIGH (ref 0–1)
Eosinophils Absolute: 0.1 10*3/uL (ref 0.0–0.7)
Hemoglobin: 13.9 g/dL (ref 13.0–17.0)
MCH: 25.5 pg — ABNORMAL LOW (ref 26.0–34.0)
MCHC: 32.3 g/dL (ref 30.0–36.0)
Monocytes Relative: 12 % (ref 3–12)
Neutro Abs: 4.1 10*3/uL (ref 1.7–7.7)
Neutrophils Relative %: 67 % (ref 43–77)
RDW: 17.2 % — ABNORMAL HIGH (ref 11.5–15.5)

## 2013-02-03 NOTE — Patient Instructions (Addendum)
CBC, CRP. Further recommendations to follow

## 2013-02-03 NOTE — Progress Notes (Addendum)
Subjective:     Patient ID: Dennis Ray, male   DOB: Apr 08, 1941, 72 y.o.   MRN: [id number]  HPI Here today for r/u of his UC. He has a complicated hx of UC and 2 DALM lesions. He underwent a rt colectomy about 2 yrs ago and now has intermittent diarrhea.  HE says he is doing okay.  Sometimes his bowel get loose and he takes Imodium for this on a prn basis. He says he does not see any blood any his stools. No abdominal pain. Appetite is good.  He has been taking ASA 325mg  4 a day for his back for months.  Colonoscopy 2011: Exam performed to cecum with a limited view of ileoappendicular and ileocecal valve. Large ulcer at ileocecal valve, another one at ascending colon along with pale mucosa and scarring,. Two small flat lesions at hepatic flexure endoscopically appeared benign. Biopsied for histology. Rest of exam was normal.      Review of Systems see hpi Current Outpatient Prescriptions  Medication Sig Dispense Refill  . amLODipine (NORVASC) 2.5 MG tablet       . cloNIDine (CATAPRES) 0.2 MG tablet Take 0.2 mg by mouth 2 (two) times daily.      . DELZICOL 400 MG CPDR TAKE 2 TABLETS BY MOUTH TWICE DAILY.  120 capsule  11  . fenofibrate 160 MG tablet Take 160 mg by mouth daily.       . furosemide (LASIX) 40 MG tablet Take 40 mg by mouth daily.        . hydrocodone-acetaminophen (LORCET PLUS) 7.5-650 MG per tablet Take 1 tablet by mouth every 6 (six) hours as needed. For pain      . lisinopril (PRINIVIL,ZESTRIL) 20 MG tablet Take 2 tablets in the am and 1 tablet in the pm  90 tablet  6  . loperamide (IMODIUM) 2 MG capsule Take 2 mg by mouth as needed.        . metoprolol (LOPRESSOR) 50 MG tablet Take 50 mg by mouth 2 (two) times daily.        . nitroGLYCERIN (NITROQUICK) 0.4 MG SL tablet Place 0.4 mg under the tongue every 5 (five) minutes x 3 doses as needed.       . potassium chloride SA (K-DUR,KLOR-CON) 20 MEQ tablet Take 20 mEq by mouth daily.        . simvastatin (ZOCOR) 20 MG tablet  Take 20 mg by mouth at bedtime.        [name] terazosin (HYTRIN) 10 MG capsule Take 10 mg by mouth 2 (two) times daily. Take 5mg  daily x1 week then increase to 10mg  daily      . warfarin (COUMADIN) 2 MG tablet Take 1 tablet (2 mg total) by mouth daily.  30 tablet  3  . zolpidem (AMBIEN) 10 MG tablet Take 10 mg by mouth at bedtime.        No current facility-administered medications for this visit.   Past Medical History  Diagnosis Date  . Hypertension   . Coronary artery disease     IMI in 09/1999->RCA stent, nl LAD, 30%, distal OM, RCA  95% distal lesion; nuclear in 07/2010-moderate-sized Inferior infarction; normal EF by echo in 07/2010  . Atrial flutter     Previously not any Coumadin candidate as the result of chronic GI blood loss  . Hyperlipidemia   . Alcohol abuse, in remission   . Ulcerative colitis     s/p subtotal colectomy  . GERD (gastroesophageal reflux disease)   .  Myocardial infarction   . Sleep apnea     STOP BANG score of 4   Past Surgical History  Procedure Laterality Date  . Coronary stent placement    . Subtotal colectomy  2012    secondary to ulcerative colitis and LGI bleeding  . Colonoscopy  08/2010    UNDER FLURO  . Colonoscopy  04/02/07    INCOMPLT  . Colonoscopy  12/04/06    INCOMP  . Colonoscopy  09/25/04    ROURK  . Upper gastrointestinal endoscopy  08/18/2010    NUR  . Cataract extraction w/phaco  02/26/2012    Procedure: CATARACT EXTRACTION PHACO AND INTRAOCULAR LENS PLACEMENT (IOC);  Surgeon: Loraine Leriche T. Nile Riggs, MD;  Location: [facility];  Service: Ophthalmology;  Laterality: Left;  CDE 8.94   Allergies  Allergen Reactions  . Doxycycline     THROAT SWELLS        Objective:   Physical Exam  Filed Vitals:   02/03/13 1036  BP: 150/80  Pulse: 76  Height: 6\' 5"  (1.956 m)  Weight: 294 lb 8 oz (133.584 kg)   Alert and oriented. Skin warm and dry. Oral mucosa is moist.   . Sclera anicteric, conjunctivae is pink. Thyroid not enlarged. No cervical  lymphadenopathy. Lungs clear. Heart regular rate and rhythm.  Abdomen is soft. Bowel sounds are positive. No hepatomegaly. No abdominal masses felt. No tenderness.  No edema to lower extremities. Stool brown and guaiac positive.     Assessment:    Hx of UC. He seems to be doing well. He is having one stool a day and takes Imodium if he has a loose stool. He was guaiac positive today. Has been  Taking ASA 325mg  x 5 a day for months.    Plan:    No more Aspirin. CBC and CRP today. Further recommendations to follow.

## 2013-02-04 ENCOUNTER — Telehealth (INDEPENDENT_AMBULATORY_CARE_PROVIDER_SITE_OTHER): Payer: Self-pay | Admitting: Internal Medicine

## 2013-02-04 DIAGNOSIS — M5137 Other intervertebral disc degeneration, lumbosacral region: Secondary | ICD-10-CM | POA: Diagnosis not present

## 2013-02-04 DIAGNOSIS — M999 Biomechanical lesion, unspecified: Secondary | ICD-10-CM | POA: Diagnosis not present

## 2013-02-04 LAB — C-REACTIVE PROTEIN: CRP: <0.5 mg/dL (ref <0.60)

## 2013-02-04 NOTE — Telephone Encounter (Signed)
Needs ov in 1 month

## 2013-02-05 DIAGNOSIS — M5137 Other intervertebral disc degeneration, lumbosacral region: Secondary | ICD-10-CM | POA: Diagnosis not present

## 2013-02-05 DIAGNOSIS — M999 Biomechanical lesion, unspecified: Secondary | ICD-10-CM | POA: Diagnosis not present

## 2013-02-05 NOTE — Telephone Encounter (Signed)
Apt has been scheduled for 03/05/13 at 10:45 am with Dorene Ar, NP.

## 2013-02-05 NOTE — Progress Notes (Signed)
Apt has been scheduled for 03/05/13 at 10:45 am with Terri Setzer, NP. 

## 2013-02-06 DIAGNOSIS — M999 Biomechanical lesion, unspecified: Secondary | ICD-10-CM | POA: Diagnosis not present

## 2013-02-06 DIAGNOSIS — M5137 Other intervertebral disc degeneration, lumbosacral region: Secondary | ICD-10-CM | POA: Diagnosis not present

## 2013-02-23 ENCOUNTER — Ambulatory Visit (INDEPENDENT_AMBULATORY_CARE_PROVIDER_SITE_OTHER): Payer: Medicare Other | Admitting: *Deleted

## 2013-02-23 DIAGNOSIS — I4891 Unspecified atrial fibrillation: Secondary | ICD-10-CM

## 2013-02-23 DIAGNOSIS — Z7901 Long term (current) use of anticoagulants: Secondary | ICD-10-CM

## 2013-02-23 LAB — POCT INR: INR: 2

## 2013-03-05 ENCOUNTER — Encounter (INDEPENDENT_AMBULATORY_CARE_PROVIDER_SITE_OTHER): Payer: Self-pay | Admitting: Internal Medicine

## 2013-03-05 ENCOUNTER — Ambulatory Visit (INDEPENDENT_AMBULATORY_CARE_PROVIDER_SITE_OTHER): Payer: Medicare Other | Admitting: Internal Medicine

## 2013-03-05 VITALS — BP 138/80 | HR 72 | Temp 98.0°F | Ht 77.0 in | Wt 295.1 lb

## 2013-03-05 DIAGNOSIS — K5120 Ulcerative (chronic) proctitis without complications: Secondary | ICD-10-CM

## 2013-03-05 MED ORDER — DIAZEPAM 2 MG PO TABS: 2.0000 mg | ORAL_TABLET | Freq: Four times a day (QID) | ORAL | Status: DC | PRN

## 2013-03-05 NOTE — Progress Notes (Signed)
Subjective:     Patient ID: Dennis Ray, male   DOB: 1940/12/10, 72 y.o.   MRN: [id number]  HPI Here today for f/u. He tells me today he feels okay.   Last seen last month for follow up of his UC.   [name] He has a hx of UC and 2 DALM lesions. He underwent a rt colectomy about 2 yrs ago and now has intermittent diarrhea.  He had been taking ASA up to 4 a day on his last visit. He was guaiac positive. CBC was normal.  No abdominal pain.  He his having a BM about 5 a week. Usually has one a day and sometimes he will skip a day.Stools are formed. He takes Imodium on a prn basis.     Colonoscopy 2011: Exam performed to cecum with a limited view of ileoappendicular and ileocecal valve. Large ulcer at ileocecal valve, another one at ascending colon along with pale mucosa and scarring,. Two small flat lesions at hepatic flexure endoscopically appeared benign. Biopsied for histology. Rest of exam was normal.   Hx of a fib and is on chronic coumadin therapy  CBC    Component Value Date/Time   WBC 6.1 02/03/2013 1140   RBC 5.46 02/03/2013 1140   HGB 13.9 02/03/2013 1140   HCT 43.1 02/03/2013 1140   PLT 199 02/03/2013 1140   MCV 78.9 02/03/2013 1140   MCH 25.5* 02/03/2013 1140   MCHC 32.3 02/03/2013 1140   RDW 17.2* 02/03/2013 1140   LYMPHSABS 1.0 02/03/2013 1140   MONOABS 0.7 02/03/2013 1140   EOSABS 0.1 02/03/2013 1140   BASOSABS 0.1 02/03/2013 1140       Review of Systems see hpi Current Outpatient Prescriptions  Medication Sig Dispense Refill  . amLODipine (NORVASC) 2.5 MG tablet       . cloNIDine (CATAPRES) 0.2 MG tablet Take 0.2 mg by mouth 2 (two) times daily.      . DELZICOL 400 MG CPDR TAKE 2 TABLETS BY MOUTH TWICE DAILY.  120 capsule  11  . diazepam (VALIUM) 2 MG tablet Take 1 tablet (2 mg total) by mouth every 6 (six) hours as needed for anxiety.  30 tablet  0  . fenofibrate 160 MG tablet Take 160 mg by mouth daily.       . furosemide (LASIX) 40 MG tablet Take 40 mg by mouth daily.        .  hydrocodone-acetaminophen (LORCET PLUS) 7.5-650 MG per tablet Take 1 tablet by mouth every 6 (six) hours as needed. For pain      . lisinopril (PRINIVIL,ZESTRIL) 20 MG tablet Take 2 tablets in the am and 1 tablet in the pm  90 tablet  6  . loperamide (IMODIUM) 2 MG capsule Take 2 mg by mouth as needed.        . metoprolol (LOPRESSOR) 50 MG tablet Take 50 mg by mouth 2 (two) times daily.        . nitroGLYCERIN (NITROQUICK) 0.4 MG SL tablet Place 0.4 mg under the tongue every 5 (five) minutes x 3 doses as needed.       . potassium chloride SA (K-DUR,KLOR-CON) 20 MEQ tablet Take 20 mEq by mouth daily.        . simvastatin (ZOCOR) 20 MG tablet Take 20 mg by mouth at bedtime.        [name] terazosin (HYTRIN) 10 MG capsule Take 10 mg by mouth 2 (two) times daily. Take 5mg  daily x1 week then increase to 10mg   daily      . warfarin (COUMADIN) 2 MG tablet Take 1 tablet (2 mg total) by mouth daily.  30 tablet  3  . zolpidem (AMBIEN) 10 MG tablet Take 10 mg by mouth at bedtime.        No current facility-administered medications for this visit.   Past Medical History  Diagnosis Date  . Hypertension   . Coronary artery disease     IMI in 09/1999->RCA stent, nl LAD, 30%, distal OM, RCA  95% distal lesion; nuclear in 07/2010-moderate-sized Inferior infarction; normal EF by echo in 07/2010  . Atrial flutter     Previously not any Coumadin candidate as the result of chronic GI blood loss  . Hyperlipidemia   . Alcohol abuse, in remission   . Ulcerative colitis     s/p subtotal colectomy  . GERD (gastroesophageal reflux disease)   . Myocardial infarction   . Sleep apnea     STOP BANG score of 4   Past Surgical History  Procedure Laterality Date  . Coronary stent placement    . Subtotal colectomy  2012    secondary to ulcerative colitis and LGI bleeding  . Colonoscopy  08/2010    UNDER FLURO  . Colonoscopy  04/02/07    INCOMPLT  . Colonoscopy  12/04/06    INCOMP  . Colonoscopy  09/25/04    ROURK  .  Upper gastrointestinal endoscopy  08/18/2010    NUR  . Cataract extraction w/phaco  02/26/2012    Procedure: CATARACT EXTRACTION PHACO AND INTRAOCULAR LENS PLACEMENT (IOC);  Surgeon: Loraine Leriche T. Nile Riggs, MD;  Location: [facility];  Service: Ophthalmology;  Laterality: Left;  CDE 8.94   Allergies  Allergen Reactions  . Doxycycline     THROAT SWELLS        Objective:   Physical Exam There were no vitals filed for this visit. Filed Vitals:   03/05/13 1122  BP: 138/80  Pulse: 72  Temp: 98 F (36.7 C)  Height: 6\' 5"  (1.956 m)  Weight: 295 lb 1.6 oz (133.856 kg)    Alert and oriented. Skin warm and dry. Oral mucosa is moist.   . Sclera anicteric, conjunctivae is pink. Thyroid not enlarged. No cervical lymphadenopathy. Lungs clear. Heart regular rate and rhythm.  Abdomen is soft. Bowel sounds are positive. No hepatomegaly. No abdominal masses felt. No tenderness.  No edema to lower extremities.       Assessment:    UC which is in remission at this time. No GI problems.     Plan:    No NSAIDS. OV in 6 months.

## 2013-03-05 NOTE — Patient Instructions (Addendum)
OV in 6 months. Rx for Valium 2mg  po as needed

## 2013-03-11 DIAGNOSIS — Z79899 Other long term (current) drug therapy: Secondary | ICD-10-CM | POA: Diagnosis not present

## 2013-03-18 DIAGNOSIS — I10 Essential (primary) hypertension: Secondary | ICD-10-CM | POA: Diagnosis not present

## 2013-03-18 DIAGNOSIS — I4891 Unspecified atrial fibrillation: Secondary | ICD-10-CM | POA: Diagnosis not present

## 2013-03-18 DIAGNOSIS — M545 Low back pain: Secondary | ICD-10-CM | POA: Diagnosis not present

## 2013-03-23 ENCOUNTER — Ambulatory Visit (INDEPENDENT_AMBULATORY_CARE_PROVIDER_SITE_OTHER): Payer: Medicare Other | Admitting: *Deleted

## 2013-03-23 DIAGNOSIS — I4891 Unspecified atrial fibrillation: Secondary | ICD-10-CM

## 2013-03-23 DIAGNOSIS — Z7901 Long term (current) use of anticoagulants: Secondary | ICD-10-CM | POA: Diagnosis not present

## 2013-04-13 ENCOUNTER — Ambulatory Visit (INDEPENDENT_AMBULATORY_CARE_PROVIDER_SITE_OTHER): Payer: Medicare Other | Admitting: *Deleted

## 2013-04-13 DIAGNOSIS — I4891 Unspecified atrial fibrillation: Secondary | ICD-10-CM

## 2013-04-13 DIAGNOSIS — Z7901 Long term (current) use of anticoagulants: Secondary | ICD-10-CM | POA: Diagnosis not present

## 2013-04-24 ENCOUNTER — Other Ambulatory Visit: Payer: Self-pay | Admitting: Cardiology

## 2013-04-27 ENCOUNTER — Other Ambulatory Visit: Payer: Self-pay | Admitting: *Deleted

## 2013-04-27 MED ORDER — NITROGLYCERIN 0.4 MG SL SUBL: 0.4000 mg | SUBLINGUAL_TABLET | SUBLINGUAL | Status: DC | PRN

## 2013-05-11 ENCOUNTER — Ambulatory Visit (INDEPENDENT_AMBULATORY_CARE_PROVIDER_SITE_OTHER): Payer: Medicare Other | Admitting: *Deleted

## 2013-05-11 ENCOUNTER — Ambulatory Visit: Payer: Medicare Other | Admitting: Adult Health

## 2013-05-11 DIAGNOSIS — I4891 Unspecified atrial fibrillation: Secondary | ICD-10-CM | POA: Diagnosis not present

## 2013-05-11 DIAGNOSIS — Z7901 Long term (current) use of anticoagulants: Secondary | ICD-10-CM | POA: Diagnosis not present

## 2013-05-11 LAB — POCT INR: INR: 2.7

## 2013-05-20 ENCOUNTER — Encounter: Payer: Self-pay | Admitting: Adult Health

## 2013-05-20 ENCOUNTER — Ambulatory Visit (INDEPENDENT_AMBULATORY_CARE_PROVIDER_SITE_OTHER): Payer: Medicare Other | Admitting: Adult Health

## 2013-05-20 VITALS — BP 150/72 | HR 68 | Ht 77.0 in | Wt 308.0 lb

## 2013-05-20 DIAGNOSIS — I4891 Unspecified atrial fibrillation: Secondary | ICD-10-CM | POA: Diagnosis not present

## 2013-05-20 DIAGNOSIS — I2510 Atherosclerotic heart disease of native coronary artery without angina pectoris: Secondary | ICD-10-CM

## 2013-05-20 DIAGNOSIS — I10 Essential (primary) hypertension: Secondary | ICD-10-CM

## 2013-05-20 NOTE — Assessment & Plan Note (Signed)
No cardiac complaints. Will focus on risk management. He is instructed to be more compliant with his medications and to take them daily.

## 2013-05-20 NOTE — Patient Instructions (Addendum)
Your physician recommends that you schedule a follow-up appointment in: ONE YEAR  Your physician recommends that you continue on your current medications as directed. Please refer to the Current Medication list given to you today.  

## 2013-05-20 NOTE — Assessment & Plan Note (Signed)
Heart rate is well controlled. He continues on anticoagulation therapy with dosing in our [place] office.

## 2013-05-20 NOTE — Assessment & Plan Note (Signed)
Slightly elevated today, but he has not taken his medications yet. He is still eating some salty foods. I have reinforced the need to eat low salt diet. His labs have been recently drawn by Dr.Fagan. No changes in current regimen.

## 2013-05-20 NOTE — Progress Notes (Signed)
HPI: [name] is a 72 y/o patient of Dr. Eden Emms we are following for ongoing assessment and treatment of atrial fib and chronic coumadin therapy. He continues to eat salty foods, and sometimes does not take his medications as directed, especially the diuretic because it causes him to urinate to much during the day. He is very talkative and requires a lot of attention during his visits. He is otherwise without complaint.  Allergies  Allergen Reactions  . Doxycycline     THROAT SWELLS    Current Outpatient Prescriptions  Medication Sig Dispense Refill  . amLODipine (NORVASC) 2.5 MG tablet       . cloNIDine (CATAPRES) 0.2 MG tablet Take 0.2 mg by mouth 2 (two) times daily.      . DELZICOL 400 MG CPDR TAKE 2 TABLETS BY MOUTH TWICE DAILY.  120 capsule  11  . diazepam (VALIUM) 2 MG tablet Take 1 tablet (2 mg total) by mouth every 6 (six) hours as needed for anxiety.  30 tablet  0  . fenofibrate 160 MG tablet Take 160 mg by mouth daily.       . furosemide (LASIX) 40 MG tablet Take 40 mg by mouth daily.        . hydrocodone-acetaminophen (LORCET PLUS) 7.5-650 MG per tablet Take 1 tablet by mouth every 6 (six) hours as needed. For pain      . lisinopril (PRINIVIL,ZESTRIL) 20 MG tablet Take 2 tablets in the am and 1 tablet in the pm  90 tablet  6  . loperamide (IMODIUM) 2 MG capsule Take 2 mg by mouth as needed.        . metoprolol (LOPRESSOR) 50 MG tablet Take 50 mg by mouth 2 (two) times daily.        . nitroGLYCERIN (NITROQUICK) 0.4 MG SL tablet Place 1 tablet (0.4 mg total) under the tongue every 5 (five) minutes x 3 doses as needed.  25 tablet  2  . potassium chloride SA (K-DUR,KLOR-CON) 20 MEQ tablet Take 20 mEq by mouth daily.        . simvastatin (ZOCOR) 20 MG tablet Take 20 mg by mouth at bedtime.        [name] terazosin (HYTRIN) 10 MG capsule Take 10 mg by mouth 2 (two) times daily. Take 5mg  daily x1 week then increase to 10mg  daily      . warfarin (COUMADIN) 2 MG tablet TAKE 1 TABLET BY  MOUTH DAILY AS DIRECTED.  30 tablet  3  . zolpidem (AMBIEN) 10 MG tablet Take 10 mg by mouth at bedtime.        No current facility-administered medications for this visit.    Past Medical History  Diagnosis Date  . Hypertension   . Coronary artery disease     IMI in 09/1999->RCA stent, nl LAD, 30%, distal OM, RCA  95% distal lesion; nuclear in 07/2010-moderate-sized Inferior infarction; normal EF by echo in 07/2010  . Atrial flutter     Previously not any Coumadin candidate as the result of chronic GI blood loss  . Hyperlipidemia   . Alcohol abuse, in remission   . Ulcerative colitis     s/p subtotal colectomy  . GERD (gastroesophageal reflux disease)   . Myocardial infarction   . Sleep apnea     STOP BANG score of 4    Past Surgical History  Procedure Laterality Date  . Coronary stent placement    . Subtotal colectomy  2012    secondary to ulcerative  colitis and LGI bleeding  . Colonoscopy  08/2010    UNDER FLURO  . Colonoscopy  04/02/07    INCOMPLT  . Colonoscopy  12/04/06    INCOMP  . Colonoscopy  09/25/04    ROURK  . Upper gastrointestinal endoscopy  08/18/2010    NUR  . Cataract extraction w/phaco  02/26/2012    Procedure: CATARACT EXTRACTION PHACO AND INTRAOCULAR LENS PLACEMENT (IOC);  Surgeon: Loraine Leriche T. Nile Riggs, MD;  Location: [facility];  Service: Ophthalmology;  Laterality: Left;  CDE 8.94    ROS: Review of systems complete and found to be negative unless listed above  PHYSICAL EXAM BP 150/72  Pulse 68  Ht 6\' 5"  (1.956 m)  Wt 308 lb (139.708 kg)  BMI 36.52 kg/m2  General: Well developed, well nourished, in no acute distress Head: Eyes PERRLA, No xanthomas.   Normal cephalic and atramatic  Lungs: Clear bilaterally to auscultation and percussion. Heart: HRIR, S1 S2, without 1/6 systolic murmur.  Pulses are 2+ & equal.            No carotid bruit. No JVD.   Abdomen: Bowel sounds are positive, abdomen soft and non-tender without masses or                   Hernia's noted. Msk:  Back normal, normal gait. Normal strength and tone for age. Extremities: No clubbing, cyanosis with non-pitting edema.  DP +1 Neuro: Alert and oriented X 3. Psych:  Good affect, responds appropriately  ZOX:WRUEAV fib with rates of 65 bpm  ASSESSMENT AND PLAN

## 2013-05-20 NOTE — Progress Notes (Deleted)
Name: Dennis Ray    DOB: 1940-12-12  Age: 72 y.o.  MR#: [id number]       PCP:  Carylon Perches, Dennis      Insurance: Payor: MEDICARE / Plan: MEDICARE PART A AND B / Product Type: *No Product type* /   CC:   No chief complaint on file.   VS Filed Vitals:   05/20/13 1410  BP: 150/72  Pulse: 68  Height: 6\' 5"  (1.956 m)  Weight: 308 lb (139.708 kg)    Weights Current Weight  05/20/13 308 lb (139.708 kg)  03/05/13 295 lb 1.6 oz (133.856 kg)  02/03/13 294 lb 8 oz (133.584 kg)    Blood Pressure  BP Readings from Last 3 Encounters:  05/20/13 150/72  03/05/13 138/80  02/03/13 150/80     Admit date:  (Not on file) Last encounter with RMR:  Visit date not found   Allergy Doxycycline  Current Outpatient Prescriptions  Medication Sig Dispense Refill  . amLODipine (NORVASC) 2.5 MG tablet       . cloNIDine (CATAPRES) 0.2 MG tablet Take 0.2 mg by mouth 2 (two) times daily.      . DELZICOL 400 MG CPDR TAKE 2 TABLETS BY MOUTH TWICE DAILY.  120 capsule  11  . diazepam (VALIUM) 2 MG tablet Take 1 tablet (2 mg total) by mouth every 6 (six) hours as needed for anxiety.  30 tablet  0  . fenofibrate 160 MG tablet Take 160 mg by mouth daily.       . furosemide (LASIX) 40 MG tablet Take 40 mg by mouth daily.        . hydrocodone-acetaminophen (LORCET PLUS) 7.5-650 MG per tablet Take 1 tablet by mouth every 6 (six) hours as needed. For pain      . lisinopril (PRINIVIL,ZESTRIL) 20 MG tablet Take 2 tablets in the am and 1 tablet in the pm  90 tablet  6  . loperamide (IMODIUM) 2 MG capsule Take 2 mg by mouth as needed.        . metoprolol (LOPRESSOR) 50 MG tablet Take 50 mg by mouth 2 (two) times daily.        . nitroGLYCERIN (NITROQUICK) 0.4 MG SL tablet Place 1 tablet (0.4 mg total) under the tongue every 5 (five) minutes x 3 doses as needed.  25 tablet  2  . potassium chloride SA (K-DUR,KLOR-CON) 20 MEQ tablet Take 20 mEq by mouth daily.        . simvastatin (ZOCOR) 20 MG tablet Take 20 mg by mouth at  bedtime.        [name] terazosin (HYTRIN) 10 MG capsule Take 10 mg by mouth 2 (two) times daily. Take 5mg  daily x1 week then increase to 10mg  daily      . warfarin (COUMADIN) 2 MG tablet TAKE 1 TABLET BY MOUTH DAILY AS DIRECTED.  30 tablet  3  . zolpidem (AMBIEN) 10 MG tablet Take 10 mg by mouth at bedtime.        No current facility-administered medications for this visit.    Discontinued Meds:   There are no discontinued medications.  Patient Active Problem List   Diagnosis Date Noted  . GERD (gastroesophageal reflux disease)   . Alcohol abuse, in remission   . Chronic anticoagulation 01/29/2011  . Coronary artery disease   . Atrial fibrillation   . Ulcerative colitis   . Hyperlipidemia 06/02/2008  . Hypertension 06/02/2008    LABS    Component Value Date/Time   NA  139 10/17/2010 0538   NA 135 10/15/2010 0448   NA 137 10/14/2010 0527   K 3.9 10/17/2010 0538   K 3.0* 10/15/2010 0448   K 3.4* 10/14/2010 0527   CL 114* 10/17/2010 0538   CL 106 10/15/2010 0448   CL 103 DELTA CHECK NOTED 10/14/2010 0527   CO2 19 10/17/2010 0538   CO2 23 10/15/2010 0448   CO2 25 10/14/2010 0527   GLUCOSE 91 10/17/2010 0538   GLUCOSE 109* 10/15/2010 0448   GLUCOSE 108* 10/14/2010 0527   BUN 22 10/17/2010 0538   BUN 42* 10/15/2010 0448   BUN 50* 10/14/2010 0527   CREATININE 1.07 10/17/2010 0538   CREATININE 1.11 10/15/2010 0448   CREATININE 1.55 DELTA CHECK NOTED* 10/14/2010 0527   CALCIUM 7.6* 10/17/2010 0538   CALCIUM 7.5* 10/15/2010 0448   CALCIUM 7.9* 10/14/2010 0527   GFRNONAA >60 10/17/2010 0538   GFRNONAA >60 10/15/2010 0448   GFRNONAA 45* 10/14/2010 0527   GFRAA  Value: >60        The eGFR has been calculated using the MDRD equation. This calculation has not been validated in all clinical situations. eGFR's persistently <60 mL/min signify possible Chronic Kidney Disease. 10/17/2010 0538   GFRAA  Value: >60        The eGFR has been calculated using the MDRD equation. This calculation has not been validated in  all clinical situations. eGFR's persistently <60 mL/min signify possible Chronic Kidney Disease. 10/15/2010 0448   GFRAA  Value: 54        The eGFR has been calculated using the MDRD equation. This calculation has not been validated in all clinical situations. eGFR's persistently <60 mL/min signify possible Chronic Kidney Disease.* 10/14/2010 0527   CMP     Component Value Date/Time   NA 139 10/17/2010 0538   K 3.9 10/17/2010 0538   CL 114* 10/17/2010 0538   CO2 19 10/17/2010 0538   GLUCOSE 91 10/17/2010 0538   BUN 22 10/17/2010 0538   CREATININE 1.07 10/17/2010 0538   CALCIUM 7.6* 10/17/2010 0538   PROT 5.7* 10/13/2010 0042   ALBUMIN 2.2* 10/13/2010 0042   AST 19 10/13/2010 0042   ALT 18 10/13/2010 0042   ALKPHOS 37* 10/13/2010 0042   BILITOT 0.4 10/13/2010 0042   GFRNONAA >60 10/17/2010 0538   GFRAA  Value: >60        The eGFR has been calculated using the MDRD equation. This calculation has not been validated in all clinical situations. eGFR's persistently <60 mL/min signify possible Chronic Kidney Disease. 10/17/2010 0538       Component Value Date/Time   WBC 6.1 02/03/2013 1140   WBC 6.1 02/19/2012 1145   WBC 11.3* 10/17/2010 0538   HGB 13.9 02/03/2013 1140   HGB 12.3* 02/19/2012 1145   HGB 12.6* 02/26/2011   HCT 43.1 02/03/2013 1140   HCT 40.6 02/19/2012 1145   HCT 41 02/26/2011   MCV 78.9 02/03/2013 1140   MCV 80.7 02/19/2012 1145   MCV 79.4 10/17/2010 0538    Lipid Panel  No results found for this basename: chol, trig, hdl, cholhdl, vldl, ldlcalc    ABG No results found for this basename: phart, pco2, pco2art, po2, po2art, hco3, tco2, acidbasedef, o2sat     No results found for this basename: TSH   BNP (last 3 results) No results found for this basename: PROBNP,  in the last 8760 hours Cardiac Panel (last 3 results) No results found for this basename: CKTOTAL, CKMB, TROPONINI, RELINDX,  in the last 72 hours  Iron/TIBC/Ferritin No results found for this basename: iron, tibc, ferritin      EKG Orders placed during the hospital encounter of 09/29/12  . ED EKG  . ED EKG  . EKG 12-LEAD  . EKG 12-LEAD  . EKG     Prior Assessment and Plan Problem List as of 05/20/2013     Cardiovascular and Mediastinum   Hypertension   Last Assessment & Plan   11/14/2012 Office Visit Edited 11/14/2012  2:22 PM by Jodelle Gross, NP     Blood pressure is not well controlled again. It may be from not taking his lasix today, but still would not be optimal for diabetic patient. Will go up on lisinopril to 40 mg in pm and 20 mg at pm from daily. He is advised to take his medications at the same time daily as directed without missing doses., Consider increasing amlodipine if not well controlled. Will repeat BMET in 1 week after going up on the dose.    Coronary artery disease   Last Assessment & Plan   11/14/2012 Office Visit Written 11/14/2012  2:21 PM by Jodelle Gross, NP     No cardiac complaints at this time. No testing planned.    Atrial fibrillation   Last Assessment & Plan   11/14/2012 Office Visit Written 11/14/2012  2:20 PM by Jodelle Gross, NP     Heart rate is well controlled at present. No changes in medications. Sees our coumadin clinic as scheduled.      Digestive   Ulcerative colitis   GERD (gastroesophageal reflux disease)     Other   Hyperlipidemia   Last Assessment & Plan   11/16/2011 Office Visit Written 11/16/2011 11:49 AM by Jodelle Gross, NP     Labs are recently drawn by Dr. Ouida Sills one week ago. I do not see results yet.  Will request results.    Chronic anticoagulation   Last Assessment & Plan   02/19/2011 Office Visit Written 02/19/2011  4:49 PM by Kathlen Brunswick, Dennis     Hemoccult testing and serial assessment of CBCs will be performed to exclude occult GI blood loss.    Alcohol abuse, in remission       Imaging: No results found.

## 2013-05-23 ENCOUNTER — Other Ambulatory Visit: Payer: Self-pay | Admitting: Cardiology

## 2013-05-25 ENCOUNTER — Telehealth: Payer: Self-pay | Admitting: *Deleted

## 2013-05-25 MED ORDER — TERAZOSIN HCL 10 MG PO CAPS: 10.0000 mg | ORAL_CAPSULE | Freq: Two times a day (BID) | ORAL | Status: DC

## 2013-05-25 NOTE — Telephone Encounter (Signed)
The pt requested we send enough medication to last until he can get in touch with PCP per out of town at this time, per discussion with manager YL we will need to fill for pt until a PCP can be contacted, sent via escribe to [place] per pt request, pt made aware verbally this is the last time you will have non cardiac related medications filled by this office, pt understood

## 2013-05-25 NOTE — Telephone Encounter (Signed)
PT states that Dr.Rothbart was refilling his prostate medication tarazosin and he needs a new rx called in for it. He has been out for a couple days. Please call him back and let him know if we are going to fill or not.

## 2013-06-08 ENCOUNTER — Ambulatory Visit (INDEPENDENT_AMBULATORY_CARE_PROVIDER_SITE_OTHER): Payer: Medicare Other | Admitting: *Deleted

## 2013-06-08 DIAGNOSIS — Z7901 Long term (current) use of anticoagulants: Secondary | ICD-10-CM | POA: Diagnosis not present

## 2013-06-08 DIAGNOSIS — I4891 Unspecified atrial fibrillation: Secondary | ICD-10-CM | POA: Diagnosis not present

## 2013-06-08 LAB — POCT INR: INR: 2.2

## 2013-06-15 DIAGNOSIS — Z79899 Other long term (current) drug therapy: Secondary | ICD-10-CM | POA: Diagnosis not present

## 2013-06-22 DIAGNOSIS — I4891 Unspecified atrial fibrillation: Secondary | ICD-10-CM | POA: Diagnosis not present

## 2013-06-22 DIAGNOSIS — Z23 Encounter for immunization: Secondary | ICD-10-CM | POA: Diagnosis not present

## 2013-06-22 DIAGNOSIS — I10 Essential (primary) hypertension: Secondary | ICD-10-CM | POA: Diagnosis not present

## 2013-06-22 DIAGNOSIS — M1990 Unspecified osteoarthritis, unspecified site: Secondary | ICD-10-CM | POA: Diagnosis not present

## 2013-07-02 ENCOUNTER — Telehealth: Payer: Self-pay | Admitting: Adult Health

## 2013-07-02 MED ORDER — TERAZOSIN HCL 5 MG PO CAPS: ORAL_CAPSULE | ORAL | Status: AC

## 2013-07-02 NOTE — Telephone Encounter (Signed)
Received fax refill request  Rx #  Medication:  Terazosin 5mg  cap Qty 60 Sig:  Take 2 capsules by mouth once daily Physician:  Lyman Bishop

## 2013-07-02 NOTE — Telephone Encounter (Signed)
Medication sent via escribe.  

## 2013-07-20 ENCOUNTER — Ambulatory Visit (INDEPENDENT_AMBULATORY_CARE_PROVIDER_SITE_OTHER): Payer: Medicare Other | Admitting: *Deleted

## 2013-07-20 DIAGNOSIS — I4891 Unspecified atrial fibrillation: Secondary | ICD-10-CM

## 2013-07-20 DIAGNOSIS — Z7901 Long term (current) use of anticoagulants: Secondary | ICD-10-CM | POA: Diagnosis not present

## 2013-08-26 DIAGNOSIS — D485 Neoplasm of uncertain behavior of skin: Secondary | ICD-10-CM | POA: Diagnosis not present

## 2013-08-26 DIAGNOSIS — L570 Actinic keratosis: Secondary | ICD-10-CM | POA: Diagnosis not present

## 2013-08-26 DIAGNOSIS — L820 Inflamed seborrheic keratosis: Secondary | ICD-10-CM | POA: Diagnosis not present

## 2013-08-31 ENCOUNTER — Encounter (INDEPENDENT_AMBULATORY_CARE_PROVIDER_SITE_OTHER): Payer: Self-pay | Admitting: Internal Medicine

## 2013-08-31 ENCOUNTER — Ambulatory Visit (INDEPENDENT_AMBULATORY_CARE_PROVIDER_SITE_OTHER): Payer: Medicare Other | Admitting: *Deleted

## 2013-08-31 ENCOUNTER — Ambulatory Visit (INDEPENDENT_AMBULATORY_CARE_PROVIDER_SITE_OTHER): Payer: Medicare Other | Admitting: Internal Medicine

## 2013-08-31 VITALS — BP 150/96 | HR 76 | Temp 97.7°F | Resp 18 | Ht 77.0 in | Wt 297.1 lb

## 2013-08-31 DIAGNOSIS — K5190 Ulcerative colitis, unspecified, without complications: Secondary | ICD-10-CM

## 2013-08-31 DIAGNOSIS — Z7901 Long term (current) use of anticoagulants: Secondary | ICD-10-CM | POA: Diagnosis not present

## 2013-08-31 DIAGNOSIS — I4891 Unspecified atrial fibrillation: Secondary | ICD-10-CM

## 2013-08-31 LAB — POCT INR: INR: 3.8

## 2013-08-31 NOTE — Progress Notes (Signed)
Presenting complaint;  Followup for ulcerative colitis.  Subjective:  Patient is 72 year old Caucasian male who has chronic ulcerative colitis status post right hemicolectomy in January 2012 for bleeding secondary to multiple ulcers. He has been maintained on oral mesalamine. He was last seen 6 months ago. He feels fine. He has 2-3 formed stools daily. Every now and then he has diarrhea and uses Imodium. He denies rectal bleeding melena or abdominal pain. He has very good appetite. He has chronic low back pain which is more pronounced when he changes posture from supine to sitting or standing position.   Current Medications: Current Outpatient Prescriptions  Medication Sig Dispense Refill  . amLODipine (NORVASC) 2.5 MG tablet Take 2.5 mg by mouth daily.       . cloNIDine (CATAPRES) 0.2 MG tablet Take 0.2 mg by mouth 2 (two) times daily.      . DELZICOL 400 MG CPDR TAKE 2 TABLETS BY MOUTH TWICE DAILY.  120 capsule  11  . diazepam (VALIUM) 2 MG tablet Take 1 tablet (2 mg total) by mouth every 6 (six) hours as needed for anxiety.  30 tablet  0  . fenofibrate 160 MG tablet Take 160 mg by mouth daily.       . furosemide (LASIX) 40 MG tablet Take 40 mg by mouth daily.        [name] HYDROcodone-acetaminophen (NORCO) 7.5-325 MG per tablet Take 1 tablet by mouth as needed for moderate pain.      [name] lisinopril (PRINIVIL,ZESTRIL) 20 MG tablet Take 2 tablets in the am and 1 tablet in the pm  90 tablet  6  . loperamide (IMODIUM) 2 MG capsule Take 2 mg by mouth as needed.        . metoprolol (LOPRESSOR) 50 MG tablet Take 50 mg by mouth 2 (two) times daily.        . nitroGLYCERIN (NITROQUICK) 0.4 MG SL tablet Place 1 tablet (0.4 mg total) under the tongue every 5 (five) minutes x 3 doses as needed.  25 tablet  2  . potassium chloride SA (K-DUR,KLOR-CON) 20 MEQ tablet Take 20 mEq by mouth daily.        . simvastatin (ZOCOR) 20 MG tablet Take 20 mg by mouth at bedtime.        [name] terazosin (HYTRIN) 5 MG capsule  TAKE 2 CAPSULES BY MOUTH ONCE DAILY.  60 capsule  6  . warfarin (COUMADIN) 2 MG tablet TAKE 1 TABLET BY MOUTH DAILY AS DIRECTED.  30 tablet  3  . zolpidem (AMBIEN) 10 MG tablet Take 10 mg by mouth at bedtime.        No current facility-administered medications for this visit.     Objective: Blood pressure 150/96, pulse 76, temperature 97.7 F (36.5 C), temperature source Oral, resp. rate 18, height 6\' 5"  (1.956 m), weight 297 lb 1.6 oz (134.764 kg). Patient is alert and in no acute distress. Conjunctiva is pink. Sclera is nonicteric Oropharyngeal mucosa is normal. No neck masses or thyromegaly noted. Cardiac exam with regular rhythm normal S1 and S2. No murmur noted. Lungs are clear to auscultation. Abdomen is full but soft and nontender without organomegaly or masses. No LE edema or clubbing noted.    Assessment:  #1. Chronic ulcerative colitis. He is status post right hemicolectomy for extensive ulceration with bleeding three years ago. He remains in remission.    Plan:  Continue Delzicol 800 mg by mouth twice a day. Office visit in one year. Surveillance colonoscopy in  January 2017.

## 2013-08-31 NOTE — Patient Instructions (Signed)
Notify if you have rectal bleeding 

## 2013-09-14 ENCOUNTER — Other Ambulatory Visit: Payer: Self-pay | Admitting: Cardiology

## 2013-09-21 ENCOUNTER — Encounter: Payer: Self-pay | Admitting: *Deleted

## 2013-09-22 DIAGNOSIS — Z79899 Other long term (current) drug therapy: Secondary | ICD-10-CM | POA: Diagnosis not present

## 2013-09-22 DIAGNOSIS — D649 Anemia, unspecified: Secondary | ICD-10-CM | POA: Diagnosis not present

## 2013-09-22 DIAGNOSIS — I10 Essential (primary) hypertension: Secondary | ICD-10-CM | POA: Diagnosis not present

## 2013-09-22 DIAGNOSIS — I4891 Unspecified atrial fibrillation: Secondary | ICD-10-CM | POA: Diagnosis not present

## 2013-09-28 DIAGNOSIS — I10 Essential (primary) hypertension: Secondary | ICD-10-CM | POA: Diagnosis not present

## 2013-09-28 DIAGNOSIS — E785 Hyperlipidemia, unspecified: Secondary | ICD-10-CM | POA: Diagnosis not present

## 2013-09-28 DIAGNOSIS — M549 Dorsalgia, unspecified: Secondary | ICD-10-CM | POA: Diagnosis not present

## 2013-09-28 DIAGNOSIS — I4891 Unspecified atrial fibrillation: Secondary | ICD-10-CM | POA: Diagnosis not present

## 2013-10-01 ENCOUNTER — Ambulatory Visit (INDEPENDENT_AMBULATORY_CARE_PROVIDER_SITE_OTHER): Payer: Medicare Other | Admitting: *Deleted

## 2013-10-01 DIAGNOSIS — I4891 Unspecified atrial fibrillation: Secondary | ICD-10-CM

## 2013-10-01 DIAGNOSIS — Z5181 Encounter for therapeutic drug level monitoring: Secondary | ICD-10-CM | POA: Diagnosis not present

## 2013-10-01 DIAGNOSIS — Z7901 Long term (current) use of anticoagulants: Secondary | ICD-10-CM | POA: Diagnosis not present

## 2013-10-01 LAB — POCT INR: INR: 2.1

## 2013-10-19 ENCOUNTER — Other Ambulatory Visit (INDEPENDENT_AMBULATORY_CARE_PROVIDER_SITE_OTHER): Payer: Self-pay | Admitting: Internal Medicine

## 2013-10-29 ENCOUNTER — Ambulatory Visit (INDEPENDENT_AMBULATORY_CARE_PROVIDER_SITE_OTHER): Payer: Medicare Other | Admitting: *Deleted

## 2013-10-29 DIAGNOSIS — Z7901 Long term (current) use of anticoagulants: Secondary | ICD-10-CM

## 2013-10-29 DIAGNOSIS — Z5181 Encounter for therapeutic drug level monitoring: Secondary | ICD-10-CM | POA: Insufficient documentation

## 2013-10-29 DIAGNOSIS — I4891 Unspecified atrial fibrillation: Secondary | ICD-10-CM | POA: Diagnosis not present

## 2013-10-29 LAB — POCT INR: INR: 2.4

## 2013-11-26 ENCOUNTER — Ambulatory Visit (INDEPENDENT_AMBULATORY_CARE_PROVIDER_SITE_OTHER): Payer: Medicare Other | Admitting: *Deleted

## 2013-11-26 DIAGNOSIS — Z5181 Encounter for therapeutic drug level monitoring: Secondary | ICD-10-CM

## 2013-11-26 DIAGNOSIS — I4891 Unspecified atrial fibrillation: Secondary | ICD-10-CM | POA: Diagnosis not present

## 2013-11-26 DIAGNOSIS — Z7901 Long term (current) use of anticoagulants: Secondary | ICD-10-CM | POA: Diagnosis not present

## 2013-11-26 LAB — POCT INR: INR: 2.4

## 2013-12-09 ENCOUNTER — Encounter (INDEPENDENT_AMBULATORY_CARE_PROVIDER_SITE_OTHER): Payer: Self-pay | Admitting: *Deleted

## 2013-12-23 DIAGNOSIS — D485 Neoplasm of uncertain behavior of skin: Secondary | ICD-10-CM | POA: Diagnosis not present

## 2013-12-23 DIAGNOSIS — B86 Scabies: Secondary | ICD-10-CM | POA: Diagnosis not present

## 2013-12-28 ENCOUNTER — Encounter (INDEPENDENT_AMBULATORY_CARE_PROVIDER_SITE_OTHER): Payer: Self-pay

## 2013-12-28 DIAGNOSIS — Z79899 Other long term (current) drug therapy: Secondary | ICD-10-CM | POA: Diagnosis not present

## 2013-12-28 DIAGNOSIS — I10 Essential (primary) hypertension: Secondary | ICD-10-CM | POA: Diagnosis not present

## 2013-12-31 ENCOUNTER — Ambulatory Visit (INDEPENDENT_AMBULATORY_CARE_PROVIDER_SITE_OTHER): Payer: Medicare Other | Admitting: *Deleted

## 2013-12-31 DIAGNOSIS — I4891 Unspecified atrial fibrillation: Secondary | ICD-10-CM

## 2013-12-31 DIAGNOSIS — Z7901 Long term (current) use of anticoagulants: Secondary | ICD-10-CM

## 2013-12-31 DIAGNOSIS — Z5181 Encounter for therapeutic drug level monitoring: Secondary | ICD-10-CM

## 2013-12-31 LAB — POCT INR: INR: 2

## 2014-01-05 DIAGNOSIS — M549 Dorsalgia, unspecified: Secondary | ICD-10-CM | POA: Diagnosis not present

## 2014-01-05 DIAGNOSIS — I10 Essential (primary) hypertension: Secondary | ICD-10-CM | POA: Diagnosis not present

## 2014-01-06 DIAGNOSIS — B86 Scabies: Secondary | ICD-10-CM | POA: Diagnosis not present

## 2014-01-06 DIAGNOSIS — L820 Inflamed seborrheic keratosis: Secondary | ICD-10-CM | POA: Diagnosis not present

## 2014-02-11 ENCOUNTER — Ambulatory Visit (INDEPENDENT_AMBULATORY_CARE_PROVIDER_SITE_OTHER): Payer: Medicare Other | Admitting: *Deleted

## 2014-02-11 DIAGNOSIS — Z7901 Long term (current) use of anticoagulants: Secondary | ICD-10-CM

## 2014-02-11 DIAGNOSIS — I4891 Unspecified atrial fibrillation: Secondary | ICD-10-CM | POA: Diagnosis not present

## 2014-02-11 DIAGNOSIS — Z5181 Encounter for therapeutic drug level monitoring: Secondary | ICD-10-CM

## 2014-02-11 LAB — POCT INR: INR: 4.8

## 2014-02-24 ENCOUNTER — Ambulatory Visit (INDEPENDENT_AMBULATORY_CARE_PROVIDER_SITE_OTHER): Payer: Medicare Other | Admitting: *Deleted

## 2014-02-24 DIAGNOSIS — Z5181 Encounter for therapeutic drug level monitoring: Secondary | ICD-10-CM | POA: Diagnosis not present

## 2014-02-24 DIAGNOSIS — Z7901 Long term (current) use of anticoagulants: Secondary | ICD-10-CM

## 2014-02-24 DIAGNOSIS — I4891 Unspecified atrial fibrillation: Secondary | ICD-10-CM | POA: Diagnosis not present

## 2014-02-24 LAB — POCT INR: INR: 3.2

## 2014-02-26 ENCOUNTER — Telehealth: Payer: Self-pay | Admitting: Adult Health

## 2014-02-26 MED ORDER — WARFARIN SODIUM 2 MG PO TABS: ORAL_TABLET | ORAL | Status: DC

## 2014-02-26 NOTE — Telephone Encounter (Signed)
Received fax refill request  Rx # [id number] Medication:  Warfarin Sodium 2 mg tab Qty 30 Sig:  Take one tablet by mouth daily as directed Physician:  Purcell Nails

## 2014-03-03 DIAGNOSIS — L820 Inflamed seborrheic keratosis: Secondary | ICD-10-CM | POA: Diagnosis not present

## 2014-03-18 DIAGNOSIS — M109 Gout, unspecified: Secondary | ICD-10-CM | POA: Diagnosis not present

## 2014-03-22 ENCOUNTER — Ambulatory Visit (INDEPENDENT_AMBULATORY_CARE_PROVIDER_SITE_OTHER): Payer: Medicare Other | Admitting: *Deleted

## 2014-03-22 DIAGNOSIS — Z7901 Long term (current) use of anticoagulants: Secondary | ICD-10-CM

## 2014-03-22 DIAGNOSIS — I4891 Unspecified atrial fibrillation: Secondary | ICD-10-CM | POA: Diagnosis not present

## 2014-03-22 DIAGNOSIS — I482 Chronic atrial fibrillation, unspecified: Secondary | ICD-10-CM

## 2014-03-22 DIAGNOSIS — Z5181 Encounter for therapeutic drug level monitoring: Secondary | ICD-10-CM

## 2014-03-22 LAB — POCT INR: INR: 2.5

## 2014-04-02 DIAGNOSIS — M109 Gout, unspecified: Secondary | ICD-10-CM | POA: Diagnosis not present

## 2014-04-02 DIAGNOSIS — Z79899 Other long term (current) drug therapy: Secondary | ICD-10-CM | POA: Diagnosis not present

## 2014-04-08 DIAGNOSIS — I4891 Unspecified atrial fibrillation: Secondary | ICD-10-CM | POA: Diagnosis not present

## 2014-04-08 DIAGNOSIS — G8929 Other chronic pain: Secondary | ICD-10-CM | POA: Diagnosis not present

## 2014-04-08 DIAGNOSIS — I10 Essential (primary) hypertension: Secondary | ICD-10-CM | POA: Diagnosis not present

## 2014-04-19 ENCOUNTER — Ambulatory Visit (INDEPENDENT_AMBULATORY_CARE_PROVIDER_SITE_OTHER): Payer: Medicare Other | Admitting: *Deleted

## 2014-04-19 DIAGNOSIS — Z5181 Encounter for therapeutic drug level monitoring: Secondary | ICD-10-CM | POA: Diagnosis not present

## 2014-04-19 DIAGNOSIS — Z7901 Long term (current) use of anticoagulants: Secondary | ICD-10-CM

## 2014-04-19 DIAGNOSIS — I4891 Unspecified atrial fibrillation: Secondary | ICD-10-CM

## 2014-04-19 LAB — POCT INR: INR: 3.2

## 2014-05-10 ENCOUNTER — Ambulatory Visit (INDEPENDENT_AMBULATORY_CARE_PROVIDER_SITE_OTHER): Payer: Medicare Other | Admitting: *Deleted

## 2014-05-10 DIAGNOSIS — Z5181 Encounter for therapeutic drug level monitoring: Secondary | ICD-10-CM

## 2014-05-10 DIAGNOSIS — I4891 Unspecified atrial fibrillation: Secondary | ICD-10-CM | POA: Diagnosis not present

## 2014-05-10 DIAGNOSIS — Z7901 Long term (current) use of anticoagulants: Secondary | ICD-10-CM | POA: Diagnosis not present

## 2014-05-10 LAB — POCT INR: INR: 1.9

## 2014-05-18 DIAGNOSIS — R51 Headache: Secondary | ICD-10-CM | POA: Diagnosis not present

## 2014-06-07 ENCOUNTER — Ambulatory Visit (INDEPENDENT_AMBULATORY_CARE_PROVIDER_SITE_OTHER): Payer: Medicare Other | Admitting: *Deleted

## 2014-06-07 DIAGNOSIS — Z7901 Long term (current) use of anticoagulants: Secondary | ICD-10-CM | POA: Diagnosis not present

## 2014-06-07 DIAGNOSIS — I4891 Unspecified atrial fibrillation: Secondary | ICD-10-CM | POA: Diagnosis not present

## 2014-06-07 DIAGNOSIS — Z5181 Encounter for therapeutic drug level monitoring: Secondary | ICD-10-CM | POA: Diagnosis not present

## 2014-06-07 LAB — POCT INR: INR: 1.7

## 2014-06-17 ENCOUNTER — Other Ambulatory Visit (HOSPITAL_COMMUNITY): Payer: Self-pay | Admitting: Internal Medicine

## 2014-06-17 DIAGNOSIS — Z23 Encounter for immunization: Secondary | ICD-10-CM | POA: Diagnosis not present

## 2014-06-17 DIAGNOSIS — R1030 Lower abdominal pain, unspecified: Secondary | ICD-10-CM | POA: Diagnosis not present

## 2014-06-17 DIAGNOSIS — R109 Unspecified abdominal pain: Secondary | ICD-10-CM

## 2014-06-23 ENCOUNTER — Ambulatory Visit (HOSPITAL_COMMUNITY)
Admission: RE | Admit: 2014-06-23 | Discharge: 2014-06-23 | Disposition: A | Discharge status: Home / Self Care | Payer: Medicare Other | Source: Ambulatory Visit | Attending: Internal Medicine | Admitting: Internal Medicine

## 2014-06-23 DIAGNOSIS — K7689 Other specified diseases of liver: Secondary | ICD-10-CM | POA: Diagnosis not present

## 2014-06-23 DIAGNOSIS — R109 Unspecified abdominal pain: Secondary | ICD-10-CM | POA: Diagnosis not present

## 2014-06-28 ENCOUNTER — Encounter: Payer: Self-pay | Admitting: *Deleted

## 2014-07-07 ENCOUNTER — Encounter: Payer: Self-pay | Admitting: *Deleted

## 2014-07-12 ENCOUNTER — Ambulatory Visit (INDEPENDENT_AMBULATORY_CARE_PROVIDER_SITE_OTHER): Payer: Medicare Other | Admitting: *Deleted

## 2014-07-12 DIAGNOSIS — I4891 Unspecified atrial fibrillation: Secondary | ICD-10-CM | POA: Diagnosis not present

## 2014-07-12 DIAGNOSIS — Z7901 Long term (current) use of anticoagulants: Secondary | ICD-10-CM

## 2014-07-12 DIAGNOSIS — Z5181 Encounter for therapeutic drug level monitoring: Secondary | ICD-10-CM

## 2014-07-12 DIAGNOSIS — Z79899 Other long term (current) drug therapy: Secondary | ICD-10-CM | POA: Diagnosis not present

## 2014-07-12 LAB — POCT INR: INR: 1.6

## 2014-07-13 DIAGNOSIS — L570 Actinic keratosis: Secondary | ICD-10-CM | POA: Diagnosis not present

## 2014-07-13 DIAGNOSIS — L820 Inflamed seborrheic keratosis: Secondary | ICD-10-CM | POA: Diagnosis not present

## 2014-07-13 DIAGNOSIS — X32XXXD Exposure to sunlight, subsequent encounter: Secondary | ICD-10-CM | POA: Diagnosis not present

## 2014-07-20 DIAGNOSIS — I10 Essential (primary) hypertension: Secondary | ICD-10-CM | POA: Diagnosis not present

## 2014-07-20 DIAGNOSIS — M545 Low back pain: Secondary | ICD-10-CM | POA: Diagnosis not present

## 2014-07-20 DIAGNOSIS — R609 Edema, unspecified: Secondary | ICD-10-CM | POA: Diagnosis not present

## 2014-07-30 ENCOUNTER — Ambulatory Visit (INDEPENDENT_AMBULATORY_CARE_PROVIDER_SITE_OTHER): Payer: Medicare Other | Admitting: Cardiology

## 2014-07-30 ENCOUNTER — Encounter: Payer: Self-pay | Admitting: Cardiology

## 2014-07-30 VITALS — BP 188/100 | HR 77 | Ht 77.0 in | Wt 294.0 lb

## 2014-07-30 DIAGNOSIS — E785 Hyperlipidemia, unspecified: Secondary | ICD-10-CM | POA: Diagnosis not present

## 2014-07-30 DIAGNOSIS — I10 Essential (primary) hypertension: Secondary | ICD-10-CM

## 2014-07-30 DIAGNOSIS — I4891 Unspecified atrial fibrillation: Secondary | ICD-10-CM

## 2014-07-30 DIAGNOSIS — I2510 Atherosclerotic heart disease of native coronary artery without angina pectoris: Secondary | ICD-10-CM | POA: Diagnosis not present

## 2014-07-30 MED ORDER — NITROGLYCERIN 0.4 MG SL SUBL: 0.4000 mg | SUBLINGUAL_TABLET | SUBLINGUAL | Status: DC | PRN

## 2014-07-30 NOTE — Addendum Note (Signed)
Addended by: Barbarann Ehlers A on: 07/30/2014 01:11 PM   Modules accepted: Orders

## 2014-07-30 NOTE — Progress Notes (Signed)
Clinical Summary Dennis Ray is a 73 y.o.male last seen by NP Purcell Nails, this is our first visit together. He is seen for the following medical problems.  1. 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  - echo 05/2012 LVEF 55-60% - denies any chest pain, occasional SOB mainly with moderate activity that is stable. Denies any LE edema. - compliant with meds  2. Afib - on coumadin, denies any bleeding issues on coumadin, stable H&H - occasional palpitaitons, approx 2-3 times a year.   3. Hyperlipidemia - compliant with statin - Jan 2015 TC 106 TG 185 HDL 24 LDL 45  4. HTN - does not check bp regularly - compliant with meds, but has not taken yet today  Past Medical History  Diagnosis Date  . Hypertension   . Coronary artery disease     IMI in 09/1999->RCA stent, nl LAD, 30%, distal OM, RCA  95% distal lesion; nuclear in 07/2010-moderate-sized Inferior infarction; normal EF by echo in 07/2010  . Atrial flutter     Previously not any Coumadin candidate as the result of chronic GI blood loss  . Hyperlipidemia   . Alcohol abuse, in remission   . Ulcerative colitis     s/p subtotal colectomy  . GERD (gastroesophageal reflux disease)   . Myocardial infarction   . Sleep apnea     STOP BANG score of 4     Allergies  Allergen Reactions  . Doxycycline     THROAT SWELLS     Current Outpatient Prescriptions  Medication Sig Dispense Refill  . amLODipine (NORVASC) 2.5 MG tablet Take 2.5 mg by mouth daily.     . cloNIDine (CATAPRES) 0.2 MG tablet Take 0.2 mg by mouth 2 (two) times daily.    . DELZICOL 400 MG CPDR DR capsule TAKE 2 CAPSULES BY MOUTH TWICE DAILY. 120 capsule 11  . fenofibrate 160 MG tablet Take 160 mg by mouth daily.     . furosemide (LASIX) 40 MG tablet Take 40 mg by mouth daily.      [name] HYDROcodone-acetaminophen (NORCO) 7.5-325 MG per tablet Take 1 tablet by mouth as needed for moderate pain.    [name] lisinopril (PRINIVIL,ZESTRIL) 20 MG tablet  Take 1 tablet daily    . loperamide (IMODIUM) 2 MG capsule Take 2 mg by mouth as needed.      . metoprolol (LOPRESSOR) 50 MG tablet Take 50 mg by mouth 2 (two) times daily.      . nitroGLYCERIN (NITROQUICK) 0.4 MG SL tablet Place 1 tablet (0.4 mg total) under the tongue every 5 (five) minutes x 3 doses as needed. 25 tablet 2  . potassium chloride SA (K-DUR,KLOR-CON) 20 MEQ tablet Take 20 mEq by mouth daily.      . simvastatin (ZOCOR) 20 MG tablet Take 20 mg by mouth at bedtime.      [name] terazosin (HYTRIN) 5 MG capsule TAKE 2 CAPSULES BY MOUTH ONCE DAILY. 60 capsule 6  . warfarin (COUMADIN) 2 MG tablet Take 1 tablet daily or as directed     No current facility-administered medications for this visit.     Past Surgical History  Procedure Laterality Date  . Coronary stent placement    . Subtotal colectomy  2012    secondary to ulcerative colitis and LGI bleeding  . Colonoscopy  08/2010    UNDER FLURO  . Colonoscopy  04/02/07    INCOMPLT  . Colonoscopy  12/04/06  INCOMP  . Colonoscopy  09/25/04    ROURK  . Upper gastrointestinal endoscopy  08/18/2010    NUR  . Cataract extraction w/phaco  02/26/2012    Procedure: CATARACT EXTRACTION PHACO AND INTRAOCULAR LENS PLACEMENT (IOC);  Surgeon: Elta Guadeloupe T. Gershon Crane, MD;  Location: [facility];  Service: Ophthalmology;  Laterality: Left;  CDE 8.94     Allergies  Allergen Reactions  . Doxycycline     THROAT SWELLS      Family History  Problem Relation Age of Onset  . Healthy Sister   . Irritable bowel syndrome Daughter   . Anesthesia problems Neg Hx   . Malignant hyperthermia Neg Hx   . Pseudochol deficiency Neg Hx      Social History Mr. Moffa reports that he has never smoked. He has never used smokeless tobacco. Mr. Abboud reports that he drinks alcohol.   Review of Systems CONSTITUTIONAL: No weight loss, fever, chills, weakness or fatigue.  HEENT: Eyes: No visual loss, blurred vision, double vision or yellow sclerae.No hearing loss,  sneezing, congestion, runny nose or sore throat.  SKIN: No rash or itching.  CARDIOVASCULAR: per HPI RESPIRATORY: No shortness of breath, cough or sputum.  GASTROINTESTINAL: No anorexia, nausea, vomiting or diarrhea. No abdominal pain or blood.  GENITOURINARY: No burning on urination, no polyuria NEUROLOGICAL: No headache, dizziness, syncope, paralysis, ataxia, numbness or tingling in the extremities. No change in bowel or bladder control.  MUSCULOSKELETAL: No muscle, back pain, joint pain or stiffness.  LYMPHATICS: No enlarged nodes. No history of splenectomy.  PSYCHIATRIC: No history of depression or anxiety.  ENDOCRINOLOGIC: No reports of sweating, cold or heat intolerance. No polyuria or polydipsia.  [name]   Physical Examination p 77 bp 180/90 Wt 294 lbs BMI 35 Gen: resting comfortably, no acute distress HEENT: no scleral icterus, pupils equal round and reactive, no palptable cervical adenopathy,  CV: irreg, no m/r/g, no JVD Resp: Clear to auscultation bilaterally GI: abdomen is soft, non-tender, non-distended, normal bowel sounds, no hepatosplenomegaly MSK: extremities are warm, no edema.  Skin: warm, no rash Neuro:  no focal deficits Psych: appropriate affect   Diagnostic Studies 05/2012 Echo Study Conclusions  - Left ventricle: The cavity size was normal. Wall thickness was increased in a pattern of mild LVH with more prominent upper septal thickening. Systolic function was normal. The estimated ejection fraction was in the range of 55% to 60%. - Aortic valve: Mildly to moderately calcified annulus. Mildly calcified leaflets. - Mitral valve: Calcified annulus. - Left atrium: The atrium was moderately dilated. - Right atrium: The atrium was mildly to moderately dilated. - Atrial septum: No defect or patent foramen ovale was identified. - Pulmonary arteries: PA peak pressure: 65mm Hg (S). Impressions:  - Compared to the prior study performed 08/08/10, there  has been no significant interval change. Small area of hypokinesis at the base of the inferior wall is no longer apparent, but there was substantial mitral annular calcification in this region, which might simulate a wall motion abnormality.  Jan 2001 Cath CORONARY ARTERIOGRAPHY: Left main coronary artery: The left main coronary artery was normal.  Left anterior descending artery: The left anterior descending artery was normal.  Circumflex coronary artery: The circumflex coronary artery had a 30% lesion in the distal obtuse marginal Miarose Lippert.  Right coronary artery: The right coronary artery had 20% multiple discrete lesions proximally. There were 40% multiple discrete lesions in the mid vessel. The distal vessel at the bend had a 95% ruptured plaque with a minimal  amount of clot left. There was normal TIMI-3 flow down the vessel. There was a large PDA and two posterolateral branches without disease.  RIGHT ANTERIOR OBLIQUE VENTRICULOGRAPHY: The RAO ventriculography revealed inferoapical hypokinesis with an EF of 50%. There was no MR. No gradient across the aortic valve. Aortic pressure was 158/87. LV pressure was in the range of  158/24 pre A wave.  IMPRESSION: The patient has had successful lytic therapy with fairly well preserved left ventricular function. He has a tight residual lesion in the distal right coronary artery. He will be referred for stenting of the distal right coronary artery by Dr. Lia Foyer.  07/30/14 Clinic EKG reviewed Afib, rate 60, inferior Q waves. Assessment and Plan  1. CAD - no current symptoms, continue risk factor modification  2. Afib - no current symptoms, continue current meds. Continue coumadin  3. HTN - elevated in clinic, he has not taken his meds yet today. Asked to take bp meds prior to next coumadin appt on Monday, we will repeat bp at that time.  4. Hyperlipidemia - at goal, continue current  meds      Arnoldo Lenis, M.D.

## 2014-07-30 NOTE — Patient Instructions (Signed)
Your physician wants you to follow-up in: 6 months You will receive a reminder letter in the mail two months in advance. If you don't receive a letter, please call our office to schedule the follow up apt.   Have your BP checked on Monday after your INR  Thank you for choosing [facility] !

## 2014-08-02 ENCOUNTER — Ambulatory Visit (INDEPENDENT_AMBULATORY_CARE_PROVIDER_SITE_OTHER): Payer: Medicare Other | Admitting: *Deleted

## 2014-08-02 ENCOUNTER — Ambulatory Visit (INDEPENDENT_AMBULATORY_CARE_PROVIDER_SITE_OTHER): Payer: Medicare Other

## 2014-08-02 VITALS — BP 142/72 | HR 60

## 2014-08-02 DIAGNOSIS — I4891 Unspecified atrial fibrillation: Secondary | ICD-10-CM

## 2014-08-02 DIAGNOSIS — I10 Essential (primary) hypertension: Secondary | ICD-10-CM | POA: Diagnosis not present

## 2014-08-02 DIAGNOSIS — Z7901 Long term (current) use of anticoagulants: Secondary | ICD-10-CM

## 2014-08-02 DIAGNOSIS — Z5181 Encounter for therapeutic drug level monitoring: Secondary | ICD-10-CM | POA: Diagnosis not present

## 2014-08-02 LAB — POCT INR: INR: 3.2

## 2014-08-02 NOTE — Progress Notes (Signed)
Seen for f/u BP today,pt did take his medication . 142/72 HR 60   Will forward to Dr.Branch

## 2014-08-02 NOTE — Patient Instructions (Signed)
Continue taking your medications as directed unless you here from [facility]       Thank you for choosing [facility] !

## 2014-08-03 ENCOUNTER — Telehealth: Payer: Self-pay | Admitting: *Deleted

## 2014-08-03 NOTE — Telephone Encounter (Signed)
Pt made aware. Forwarded to Dr. Willey Blade

## 2014-08-03 NOTE — Telephone Encounter (Signed)
-----   Message from Arnoldo Lenis, MD sent at 08/03/2014  1:08 PM EST ----- Blood pressure looks ok, will continue current meds  Zandra Abts MD ----- Message -----    From: Bernita Raisin, RN    Sent: 08/02/2014   3:26 PM      To: Arnoldo Lenis, MD

## 2014-08-23 ENCOUNTER — Ambulatory Visit (INDEPENDENT_AMBULATORY_CARE_PROVIDER_SITE_OTHER): Payer: Medicare Other | Admitting: *Deleted

## 2014-08-23 DIAGNOSIS — Z7901 Long term (current) use of anticoagulants: Secondary | ICD-10-CM

## 2014-08-23 DIAGNOSIS — Z5181 Encounter for therapeutic drug level monitoring: Secondary | ICD-10-CM

## 2014-08-23 DIAGNOSIS — I4891 Unspecified atrial fibrillation: Secondary | ICD-10-CM | POA: Diagnosis not present

## 2014-08-23 LAB — POCT INR: INR: 4.2

## 2014-08-30 ENCOUNTER — Telehealth: Payer: Self-pay | Admitting: Unknown Physician Specialty

## 2014-08-30 ENCOUNTER — Telehealth: Payer: Self-pay | Admitting: *Deleted

## 2014-08-30 NOTE — Telephone Encounter (Signed)
Patient has questions regarding his coumadin dosage / tgs

## 2014-08-30 NOTE — Telephone Encounter (Signed)
Error / tgs  °

## 2014-08-30 NOTE — Telephone Encounter (Signed)
Called pt and verified his coumadin scheduled.  He lost his instruction sheet.

## 2014-08-31 ENCOUNTER — Encounter (INDEPENDENT_AMBULATORY_CARE_PROVIDER_SITE_OTHER): Payer: Self-pay | Admitting: Internal Medicine

## 2014-08-31 ENCOUNTER — Ambulatory Visit (INDEPENDENT_AMBULATORY_CARE_PROVIDER_SITE_OTHER): Payer: Medicare Other | Admitting: Internal Medicine

## 2014-08-31 VITALS — BP 130/90 | HR 60 | Temp 97.4°F | Resp 18

## 2014-08-31 DIAGNOSIS — K5190 Ulcerative colitis, unspecified, without complications: Secondary | ICD-10-CM

## 2014-08-31 DIAGNOSIS — I2510 Atherosclerotic heart disease of native coronary artery without angina pectoris: Secondary | ICD-10-CM | POA: Diagnosis not present

## 2014-08-31 NOTE — Progress Notes (Signed)
Presenting complaint;   follow-up for ulcerative colitis.  Database;  Patient has approximately 53 m 3 of all suite of colitis. He was found to have ulcers in the proximal colon. He was evaluated at [facility] and proctocolectomy was recommended which he declined. He bled twice in fall of 2012 when event on anticoagulation for atrial fibrillation. He was found to have multiple ulcers in right colon histologically benign. He agreed to have right hemicolectomy which was performed by Dr. Geroge Baseman on 10/04/2010. He was last seen in December 2014.   Subjective:  [name] has no complaints. His bowels generally move every other day. He denies abdominal pain melena or rectal bleeding. He has very good appetite. He also denies nausea and vomiting. His weight is down by 2 pounds. He complains of constant back pain and has office visit scheduled with Dr. Glenna Fellows. He states his daughter was 74 years old has ulcerative colitis. She lives in [place] and under care of  Dr. Elvera Ray.   Current Medications: Outpatient Encounter Prescriptions as of 08/31/2014  Medication Sig  . amLODipine (NORVASC) 2.5 MG tablet Take 5 mg by mouth daily.   . cloNIDine (CATAPRES) 0.2 MG tablet Take 0.2 mg by mouth 2 (two) times daily.  . DELZICOL 400 MG CPDR DR capsule TAKE 2 CAPSULES BY MOUTH TWICE DAILY.  . fenofibrate 160 MG tablet Take 160 mg by mouth daily.   . fluticasone (CUTIVATE) 0.05 % cream Apply 1 application topically as needed.   . furosemide (LASIX) 40 MG tablet Take 40 mg by mouth daily.    [name] HYDROcodone-acetaminophen (NORCO) 7.5-325 MG per tablet Take 13 tablets by mouth 3 (three) times daily as needed for moderate pain.   . metoprolol (LOPRESSOR) 50 MG tablet Take 50 mg by mouth 2 (two) times daily.    . nitroGLYCERIN (NITROSTAT) 0.4 MG SL tablet Place 1 tablet (0.4 mg total) under the tongue every 5 (five) minutes x 3 doses as needed.  . potassium chloride SA (K-DUR,KLOR-CON) 20 MEQ tablet Take 20 mEq by  mouth daily.    . simvastatin (ZOCOR) 20 MG tablet Take 20 mg by mouth at bedtime.    [name] terazosin (HYTRIN) 5 MG capsule TAKE 2 CAPSULES BY MOUTH ONCE DAILY.  [name] warfarin (COUMADIN) 2 MG tablet Take 1 tablet daily or as directed  . zolpidem (AMBIEN) 10 MG tablet Take 10 mg by mouth at bedtime.   [name] lisinopril (PRINIVIL,ZESTRIL) 20 MG tablet 40 mg. Take 1 tablet daily  . [DISCONTINUED] loperamide (IMODIUM) 2 MG capsule Take 2 mg by mouth as needed.       Objective: Blood pressure 130/90, pulse 60, temperature 97.4 F (36.3 C), temperature source Oral, resp. rate 18.  Patient is alert and in no acute distress. Conjunctiva is pink. Sclera is nonicteric Oropharyngeal mucosa is normal. No neck masses or thyromegaly noted. Cardiac exam with iregular rhythm normal S1 and S2. No murmur or gallop noted. Lungs are clear to auscultation. Abdomen is protuberant but soft and nontender without organomegaly or masses.  No LE edema or clubbing noted.  Labs/studies Results:   recent CBC is not available.  Assessment:  #1. Chronic ulcerative colitis appears to be in remission. He underwent right hemicolectomy in January 2012 because of the recurrent bleeding due to nonhealing ulcers which were benign. Last colonoscopy was 4 years ago.    Plan:  Requests copy of recent blood work from Dr. Ria Comment office. Continue Delsicol at 800 mg by mouth twice a day. Office visit  in 1 year. Surveillance colonoscopy in December, 216.

## 2014-09-06 ENCOUNTER — Ambulatory Visit (INDEPENDENT_AMBULATORY_CARE_PROVIDER_SITE_OTHER): Payer: Medicare Other | Admitting: *Deleted

## 2014-09-06 DIAGNOSIS — Z5181 Encounter for therapeutic drug level monitoring: Secondary | ICD-10-CM

## 2014-09-06 DIAGNOSIS — Z7901 Long term (current) use of anticoagulants: Secondary | ICD-10-CM | POA: Diagnosis not present

## 2014-09-06 DIAGNOSIS — I4891 Unspecified atrial fibrillation: Secondary | ICD-10-CM

## 2014-09-06 LAB — POCT INR: INR: 2.7

## 2014-09-07 ENCOUNTER — Encounter (INDEPENDENT_AMBULATORY_CARE_PROVIDER_SITE_OTHER): Payer: Self-pay

## 2014-09-27 ENCOUNTER — Ambulatory Visit (INDEPENDENT_AMBULATORY_CARE_PROVIDER_SITE_OTHER): Payer: Medicare Other | Admitting: *Deleted

## 2014-09-27 DIAGNOSIS — I4891 Unspecified atrial fibrillation: Secondary | ICD-10-CM | POA: Diagnosis not present

## 2014-09-27 DIAGNOSIS — Z7901 Long term (current) use of anticoagulants: Secondary | ICD-10-CM | POA: Diagnosis not present

## 2014-09-27 DIAGNOSIS — Z5181 Encounter for therapeutic drug level monitoring: Secondary | ICD-10-CM

## 2014-09-27 LAB — POCT INR: INR: 1.8

## 2014-10-18 ENCOUNTER — Encounter: Payer: Self-pay | Admitting: *Deleted

## 2014-10-19 DIAGNOSIS — D643 Other sideroblastic anemias: Secondary | ICD-10-CM | POA: Diagnosis not present

## 2014-10-19 DIAGNOSIS — Z79899 Other long term (current) drug therapy: Secondary | ICD-10-CM | POA: Diagnosis not present

## 2014-10-19 DIAGNOSIS — I2510 Atherosclerotic heart disease of native coronary artery without angina pectoris: Secondary | ICD-10-CM | POA: Diagnosis not present

## 2014-10-19 DIAGNOSIS — I10 Essential (primary) hypertension: Secondary | ICD-10-CM | POA: Diagnosis not present

## 2014-10-20 ENCOUNTER — Ambulatory Visit (INDEPENDENT_AMBULATORY_CARE_PROVIDER_SITE_OTHER): Payer: Medicare Other | Admitting: *Deleted

## 2014-10-20 DIAGNOSIS — Z5181 Encounter for therapeutic drug level monitoring: Secondary | ICD-10-CM | POA: Diagnosis not present

## 2014-10-20 DIAGNOSIS — I480 Paroxysmal atrial fibrillation: Secondary | ICD-10-CM | POA: Diagnosis not present

## 2014-10-20 DIAGNOSIS — I4891 Unspecified atrial fibrillation: Secondary | ICD-10-CM

## 2014-10-20 DIAGNOSIS — Z7901 Long term (current) use of anticoagulants: Secondary | ICD-10-CM | POA: Diagnosis not present

## 2014-10-20 LAB — POCT INR: INR: 1.8

## 2014-10-26 DIAGNOSIS — I10 Essential (primary) hypertension: Secondary | ICD-10-CM | POA: Diagnosis not present

## 2014-10-26 DIAGNOSIS — E785 Hyperlipidemia, unspecified: Secondary | ICD-10-CM | POA: Diagnosis not present

## 2014-10-26 DIAGNOSIS — G8929 Other chronic pain: Secondary | ICD-10-CM | POA: Diagnosis not present

## 2014-10-28 ENCOUNTER — Telehealth (INDEPENDENT_AMBULATORY_CARE_PROVIDER_SITE_OTHER): Payer: Self-pay | Admitting: *Deleted

## 2014-10-28 DIAGNOSIS — K5190 Ulcerative colitis, unspecified, without complications: Secondary | ICD-10-CM

## 2014-10-28 NOTE — Telephone Encounter (Signed)
Per Dr.Rehman the patient will need to have labs drawn and hs is to get 1 hemoccult card. After all results are given to [place] he will then decide about Office Visiit for the patient. Patient called and made aware.

## 2014-11-03 ENCOUNTER — Telehealth (INDEPENDENT_AMBULATORY_CARE_PROVIDER_SITE_OTHER): Payer: Self-pay | Admitting: *Deleted

## 2014-11-03 DIAGNOSIS — K5190 Ulcerative colitis, unspecified, without complications: Secondary | ICD-10-CM | POA: Diagnosis not present

## 2014-11-03 NOTE — Telephone Encounter (Signed)
   Diagnosis:    Result(s)   Card 1: Positive:             Completed by: Auriella Wieand,LPN   HEMOCCULT SENSA DEVELOPER: LOT#:  [id number]  EXPIRATION DATE: 9-17   HEMOCCULT SENSA CARD:  LOT#:  [id number] EXPIRATION DATE: 07-18   CARD CONTROL RESULTS:  POSITIVE: Positive NEGATIVE: Negative    ADDITIONAL COMMENTS: Patient is aware of his result. He was advised that Dr.Rehman would be made aware,as we are awaitng labs ordered by Dr. Laural Golden.

## 2014-11-04 ENCOUNTER — Other Ambulatory Visit (INDEPENDENT_AMBULATORY_CARE_PROVIDER_SITE_OTHER): Payer: Self-pay | Admitting: Internal Medicine

## 2014-11-04 ENCOUNTER — Encounter (INDEPENDENT_AMBULATORY_CARE_PROVIDER_SITE_OTHER): Payer: Self-pay

## 2014-11-04 LAB — IRON AND TIBC
%SAT: 5 % — AB (ref 20–55)
Iron: 25 ug/dL — ABNORMAL LOW (ref 42–165)
TIBC: 461 ug/dL — AB (ref 215–435)
UIBC: 436 ug/dL — ABNORMAL HIGH (ref 125–400)

## 2014-11-04 LAB — FERRITIN: Ferritin: 21 ng/mL — ABNORMAL LOW (ref 22–322)

## 2014-11-08 NOTE — Telephone Encounter (Signed)
Hemoccult is positive. Patient was called Friday but there was no answer. Will try to contact him today.

## 2014-11-09 ENCOUNTER — Other Ambulatory Visit (INDEPENDENT_AMBULATORY_CARE_PROVIDER_SITE_OTHER): Payer: Self-pay | Admitting: *Deleted

## 2014-11-09 ENCOUNTER — Telehealth (INDEPENDENT_AMBULATORY_CARE_PROVIDER_SITE_OTHER): Payer: Self-pay | Admitting: *Deleted

## 2014-11-09 DIAGNOSIS — R195 Other fecal abnormalities: Secondary | ICD-10-CM

## 2014-11-09 DIAGNOSIS — Z1211 Encounter for screening for malignant neoplasm of colon: Secondary | ICD-10-CM

## 2014-11-09 MED ORDER — PEG 3350-KCL-NA BICARB-NACL 420 G PO SOLR: 4000.0000 mL | Freq: Once | ORAL | Status: DC

## 2014-11-09 NOTE — Telephone Encounter (Signed)
OK to hold coumadin 5 days prior to procedure.  Restart coumadin night of procedure if OK with Dr Laural Golden.  Pt needs INR check in 7 -10 days after restarting coumadin.

## 2014-11-09 NOTE — Telephone Encounter (Signed)
noted 

## 2014-11-09 NOTE — Telephone Encounter (Signed)
Patient needs trilyte 

## 2014-11-09 NOTE — Telephone Encounter (Signed)
Patient sch'd for colonoscppy & EGD on 11/19/14, needs to stop Coumadin 5 days prior -- is this ok, please advise, thanks

## 2014-11-09 NOTE — Telephone Encounter (Signed)
Will forward to Ms. Wynonia Lawman

## 2014-11-10 ENCOUNTER — Ambulatory Visit (INDEPENDENT_AMBULATORY_CARE_PROVIDER_SITE_OTHER): Payer: Medicare Other | Admitting: *Deleted

## 2014-11-10 DIAGNOSIS — Z5181 Encounter for therapeutic drug level monitoring: Secondary | ICD-10-CM | POA: Diagnosis not present

## 2014-11-10 DIAGNOSIS — I4891 Unspecified atrial fibrillation: Secondary | ICD-10-CM | POA: Diagnosis not present

## 2014-11-10 DIAGNOSIS — I480 Paroxysmal atrial fibrillation: Secondary | ICD-10-CM

## 2014-11-10 DIAGNOSIS — Z7901 Long term (current) use of anticoagulants: Secondary | ICD-10-CM

## 2014-11-10 LAB — POCT INR: INR: 1.7

## 2014-11-11 ENCOUNTER — Emergency Department (HOSPITAL_COMMUNITY): Payer: Medicare Other

## 2014-11-11 ENCOUNTER — Emergency Department (HOSPITAL_COMMUNITY)
Admission: EM | Admit: 2014-11-11 | Discharge: 2014-11-11 | Disposition: A | Discharge status: Home / Self Care | Payer: Medicare Other | Attending: Emergency Medicine | Admitting: Emergency Medicine

## 2014-11-11 ENCOUNTER — Encounter (HOSPITAL_COMMUNITY): Payer: Self-pay | Admitting: Emergency Medicine

## 2014-11-11 DIAGNOSIS — Z7901 Long term (current) use of anticoagulants: Secondary | ICD-10-CM | POA: Diagnosis not present

## 2014-11-11 DIAGNOSIS — Z79899 Other long term (current) drug therapy: Secondary | ICD-10-CM | POA: Diagnosis not present

## 2014-11-11 DIAGNOSIS — Z8669 Personal history of other diseases of the nervous system and sense organs: Secondary | ICD-10-CM | POA: Insufficient documentation

## 2014-11-11 DIAGNOSIS — I10 Essential (primary) hypertension: Secondary | ICD-10-CM | POA: Insufficient documentation

## 2014-11-11 DIAGNOSIS — I252 Old myocardial infarction: Secondary | ICD-10-CM | POA: Diagnosis not present

## 2014-11-11 DIAGNOSIS — R109 Unspecified abdominal pain: Secondary | ICD-10-CM | POA: Diagnosis present

## 2014-11-11 DIAGNOSIS — J159 Unspecified bacterial pneumonia: Secondary | ICD-10-CM | POA: Diagnosis not present

## 2014-11-11 DIAGNOSIS — I2510 Atherosclerotic heart disease of native coronary artery without angina pectoris: Secondary | ICD-10-CM | POA: Insufficient documentation

## 2014-11-11 DIAGNOSIS — Z792 Long term (current) use of antibiotics: Secondary | ICD-10-CM | POA: Insufficient documentation

## 2014-11-11 DIAGNOSIS — J181 Lobar pneumonia, unspecified organism: Secondary | ICD-10-CM | POA: Diagnosis not present

## 2014-11-11 DIAGNOSIS — J189 Pneumonia, unspecified organism: Secondary | ICD-10-CM

## 2014-11-11 DIAGNOSIS — Z87448 Personal history of other diseases of urinary system: Secondary | ICD-10-CM | POA: Diagnosis not present

## 2014-11-11 DIAGNOSIS — E785 Hyperlipidemia, unspecified: Secondary | ICD-10-CM | POA: Insufficient documentation

## 2014-11-11 DIAGNOSIS — J209 Acute bronchitis, unspecified: Secondary | ICD-10-CM | POA: Diagnosis not present

## 2014-11-11 MED ORDER — OXYCODONE-ACETAMINOPHEN 5-325 MG PO TABS
2.0000 | ORAL_TABLET | Freq: Once | ORAL | Status: AC
  Administered 2014-11-11: 2 via ORAL
  Filled 2014-11-11: qty 2

## 2014-11-11 MED ORDER — LEVOFLOXACIN 500 MG PO TABS: 500.0000 mg | ORAL_TABLET | Freq: Every day | ORAL | Status: DC

## 2014-11-11 MED ORDER — LEVOFLOXACIN 750 MG PO TABS
750.0000 mg | ORAL_TABLET | Freq: Once | ORAL | Status: AC
  Administered 2014-11-11: 750 mg via ORAL
  Filled 2014-11-11: qty 1

## 2014-11-11 NOTE — ED Provider Notes (Signed)
CSN: [id number]     Arrival date & time 11/11/14  2023 History  This chart was scribed for Virgel Manifold, MD by Randa Evens, ED Scribe. This patient was seen in room APA05/APA05 and the patient's care was started at 8:43 PM.    Chief Complaint  Patient presents with  . Abdominal Pain   The history is provided by the patient. No language interpreter was used.   HPI Comments: Dennis Ray is a 74 y.o. male Hx of MI, atrial fibrillation, and GERD who presents to the Emergency Department complaining of intermittent worsening sharp stabbing right sided abdominal pain onset 1 day prior. Pt states that he feels slightly SOB with productive cough of green sputum. Pt states that the pain is worse with deep breathing. Pt states that he was prescribed antibiotic today from PCP. Pt states that he has not had the prescription filled. Pt denies nausea or other related symptoms. Pt states that he has urinary frequency but states that it's not new.    Past Medical History  Diagnosis Date  . Hypertension   . Coronary artery disease     IMI in 09/1999->RCA stent, nl LAD, 30%, distal OM, RCA  95% distal lesion; nuclear in 07/2010-moderate-sized Inferior infarction; normal EF by echo in 07/2010  . Atrial flutter     Previously not any Coumadin candidate as the result of chronic GI blood loss  . Hyperlipidemia   . Alcohol abuse, in remission   . Ulcerative colitis     s/p subtotal colectomy  . GERD (gastroesophageal reflux disease)   . Myocardial infarction   . Sleep apnea     STOP BANG score of 4   Past Surgical History  Procedure Laterality Date  . Coronary stent placement    . Subtotal colectomy  2012    secondary to ulcerative colitis and LGI bleeding  . Colonoscopy  08/2010    UNDER FLURO  . Colonoscopy  04/02/07    INCOMPLT  . Colonoscopy  12/04/06    INCOMP  . Colonoscopy  09/25/04    ROURK  . Upper gastrointestinal endoscopy  08/18/2010    NUR  . Cataract extraction w/phaco   02/26/2012    Procedure: CATARACT EXTRACTION PHACO AND INTRAOCULAR LENS PLACEMENT (IOC);  Surgeon: Elta Guadeloupe T. Gershon Crane, MD;  Location: [facility];  Service: Ophthalmology;  Laterality: Left;  CDE 8.94   Family History  Problem Relation Age of Onset  . Healthy Sister   . Irritable bowel syndrome Daughter   . Anesthesia problems Neg Hx   . Malignant hyperthermia Neg Hx   . Pseudochol deficiency Neg Hx    History  Substance Use Topics  . Smoking status: Never Smoker   . Smokeless tobacco: Never Used  . Alcohol Use: 0.0 oz/week    0 Standard drinks or equivalent per week     Comment: 12oz beer 1-2 times weekly    Review of Systems  Respiratory: Positive for cough and shortness of breath.   Gastrointestinal: Positive for abdominal pain. Negative for nausea.  All other systems reviewed and are negative.    Allergies  Doxycycline  Home Medications   Prior to Admission medications   Medication Sig Start Date End Date Taking? Authorizing Provider  amLODipine (NORVASC) 2.5 MG tablet Take 5 mg by mouth daily.  02/25/12   Yehuda Savannah, MD  cloNIDine (CATAPRES) 0.2 MG tablet Take 0.2 mg by mouth 2 (two) times daily.    Historical Provider, MD  DELZICOL 400 MG CPDR  DR capsule TAKE 2 CAPSULES BY MOUTH TWICE DAILY. 11/04/14   Rona Ravens Setzer, NP  fluticasone (CUTIVATE) 0.05 % cream Apply 1 application topically as needed.  08/14/14   Historical Provider, MD  furosemide (LASIX) 40 MG tablet Take 40 mg by mouth daily.      Historical Provider, MD  HYDROcodone-acetaminophen (NORCO) 7.5-325 MG per tablet Take 1 tablet by mouth 3 (three) times daily as needed for moderate pain.     Historical Provider, MD  levofloxacin (LEVAQUIN) 500 MG tablet Take 1 tablet (500 mg total) by mouth daily. 11/11/14   Virgel Manifold, MD  lisinopril (PRINIVIL,ZESTRIL) 40 MG tablet Take 40 mg by mouth daily.    Historical Provider, MD  metoprolol (LOPRESSOR) 50 MG tablet Take 50 mg by mouth 2 (two) times daily.       Historical Provider, MD  nitroGLYCERIN (NITROSTAT) 0.4 MG SL tablet Place 1 tablet (0.4 mg total) under the tongue every 5 (five) minutes x 3 doses as needed. 07/30/14   Arnoldo Lenis, MD  polyethylene glycol-electrolytes (NULYTELY/GOLYTELY) 420 G solution Take 4,000 mLs by mouth once. 11/09/14   Butch Penny, NP  potassium chloride SA (K-DUR,KLOR-CON) 20 MEQ tablet Take 20 mEq by mouth daily.      Historical Provider, MD  simvastatin (ZOCOR) 20 MG tablet Take 20 mg by mouth at bedtime.      Historical Provider, MD  terazosin (HYTRIN) 5 MG capsule TAKE 2 CAPSULES BY MOUTH ONCE DAILY. 07/02/13   Lendon Colonel, NP  warfarin (COUMADIN) 2 MG tablet Take 2 mg by mouth daily at 6 PM. Take 1 tablet daily or as directed 02/26/14   Lendon Colonel, NP  zolpidem (AMBIEN) 10 MG tablet Take 10 mg by mouth at bedtime.  07/05/14   Historical Provider, MD   BP 122/106 mmHg  Pulse 111  Temp(Src) 100.1 F (37.8 C) (Oral)  Resp 15  Ht 6\' 5"  (1.956 m)  Wt 300 lb (136.079 kg)  BMI 35.57 kg/m2  SpO2 92%   Physical Exam  Constitutional: He is oriented to person, place, and time. He appears well-developed and well-nourished.  HENT:  Head: Normocephalic and atraumatic.  Eyes: Conjunctivae and EOM are normal. Pupils are equal, round, and reactive to light.  Neck: Normal range of motion. Neck supple.  Pulmonary/Chest: Effort normal and breath sounds normal.  Abdominal: Soft. Bowel sounds are normal. There is no tenderness.  Musculoskeletal: Normal range of motion.  Neurological: He is alert and oriented to person, place, and time.  Skin: Skin is warm and dry.  Psychiatric: He has a normal mood and affect. His behavior is normal.  Nursing note and vitals reviewed.   ED Course  Procedures (including critical care time) DIAGNOSTIC STUDIES: Oxygen Saturation is 92% on RA, normal by my interpretation.    COORDINATION OF CARE: 9:01 PM-Discussed treatment plan with pt at bedside and pt agreed to  plan.     Labs Review Labs Reviewed - No data to display  Imaging Review Dg Chest 2 View  11/11/2014   CLINICAL DATA:  Right-sided abdominal and chest pain  EXAM: CHEST  2 VIEW  COMPARISON:  01/11/2012  FINDINGS: Cardiac shadow is within normal limits. The lungs are well aerated bilaterally right middle lobe infiltrate is identified with some volume loss seen. The left lung is clear. The bony structures are within normal limits for the patient's age.  IMPRESSION: Right middle lobe pneumonia. Followup films following appropriate therapy are recommended.   Electronically  Signed   By: Inez Catalina M.D.   On: 11/11/2014 21:25     EKG Interpretation None      MDM   Final diagnoses:  CAP (community acquired pneumonia)     74 year old male with right-sided chest pain/right upper quadrant pain. Symptoms most consistent with pneumonia. radiographically this correlates.  Reports that recently prescribed antibiotics but has not yet filled. He is not sure what they were specifically treating. Will place patient on Levaquin. I feel is appropriate for outpatient treatment at this time. Return precautions were discussed. Close follow-up with his PCP otherwise.   I personally preformed the services scribed in my presence. The recorded information has been reviewed is accurate. Virgel Manifold, MD.      Virgel Manifold, MD 11/17/14 [phone]

## 2014-11-11 NOTE — Discharge Instructions (Signed)

## 2014-11-11 NOTE — ED Notes (Addendum)
Pt went to family doctor today, given antibiotic, pt has not filled, pt not feeling well, hurting in right shoulder and right side abdominal pain, rib area, cough worth green brown sputum

## 2014-11-16 ENCOUNTER — Emergency Department (HOSPITAL_COMMUNITY)
Admission: EM | Admit: 2014-11-16 | Discharge: 2014-11-16 | Disposition: A | Discharge status: Home / Self Care | Payer: Medicare Other | Attending: Emergency Medicine | Admitting: Emergency Medicine

## 2014-11-16 ENCOUNTER — Emergency Department (HOSPITAL_COMMUNITY): Payer: Medicare Other

## 2014-11-16 ENCOUNTER — Encounter (HOSPITAL_COMMUNITY): Payer: Self-pay | Admitting: Emergency Medicine

## 2014-11-16 DIAGNOSIS — I10 Essential (primary) hypertension: Secondary | ICD-10-CM | POA: Diagnosis not present

## 2014-11-16 DIAGNOSIS — I252 Old myocardial infarction: Secondary | ICD-10-CM | POA: Insufficient documentation

## 2014-11-16 DIAGNOSIS — Z8719 Personal history of other diseases of the digestive system: Secondary | ICD-10-CM | POA: Insufficient documentation

## 2014-11-16 DIAGNOSIS — Z9861 Coronary angioplasty status: Secondary | ICD-10-CM | POA: Diagnosis not present

## 2014-11-16 DIAGNOSIS — W01198A Fall on same level from slipping, tripping and stumbling with subsequent striking against other object, initial encounter: Secondary | ICD-10-CM | POA: Insufficient documentation

## 2014-11-16 DIAGNOSIS — Y9289 Other specified places as the place of occurrence of the external cause: Secondary | ICD-10-CM | POA: Insufficient documentation

## 2014-11-16 DIAGNOSIS — Y9389 Activity, other specified: Secondary | ICD-10-CM | POA: Diagnosis not present

## 2014-11-16 DIAGNOSIS — R0602 Shortness of breath: Secondary | ICD-10-CM | POA: Diagnosis not present

## 2014-11-16 DIAGNOSIS — Z7901 Long term (current) use of anticoagulants: Secondary | ICD-10-CM | POA: Diagnosis not present

## 2014-11-16 DIAGNOSIS — E785 Hyperlipidemia, unspecified: Secondary | ICD-10-CM | POA: Diagnosis not present

## 2014-11-16 DIAGNOSIS — Y998 Other external cause status: Secondary | ICD-10-CM | POA: Insufficient documentation

## 2014-11-16 DIAGNOSIS — Z8669 Personal history of other diseases of the nervous system and sense organs: Secondary | ICD-10-CM | POA: Diagnosis not present

## 2014-11-16 DIAGNOSIS — S59911A Unspecified injury of right forearm, initial encounter: Secondary | ICD-10-CM | POA: Diagnosis present

## 2014-11-16 DIAGNOSIS — I4892 Unspecified atrial flutter: Secondary | ICD-10-CM | POA: Diagnosis not present

## 2014-11-16 DIAGNOSIS — R55 Syncope and collapse: Secondary | ICD-10-CM | POA: Diagnosis not present

## 2014-11-16 DIAGNOSIS — S00411A Abrasion of right ear, initial encounter: Secondary | ICD-10-CM | POA: Diagnosis not present

## 2014-11-16 DIAGNOSIS — R410 Disorientation, unspecified: Secondary | ICD-10-CM | POA: Diagnosis not present

## 2014-11-16 DIAGNOSIS — S50811A Abrasion of right forearm, initial encounter: Secondary | ICD-10-CM | POA: Diagnosis not present

## 2014-11-16 DIAGNOSIS — Z79899 Other long term (current) drug therapy: Secondary | ICD-10-CM | POA: Diagnosis not present

## 2014-11-16 DIAGNOSIS — Z7951 Long term (current) use of inhaled steroids: Secondary | ICD-10-CM | POA: Diagnosis not present

## 2014-11-16 DIAGNOSIS — W19XXXA Unspecified fall, initial encounter: Secondary | ICD-10-CM

## 2014-11-16 DIAGNOSIS — I2510 Atherosclerotic heart disease of native coronary artery without angina pectoris: Secondary | ICD-10-CM | POA: Insufficient documentation

## 2014-11-16 DIAGNOSIS — S0990XA Unspecified injury of head, initial encounter: Secondary | ICD-10-CM | POA: Diagnosis not present

## 2014-11-16 LAB — COMPREHENSIVE METABOLIC PANEL
ALT: 21 U/L (ref 0–53)
AST: 30 U/L (ref 0–37)
Albumin: 3.2 g/dL — ABNORMAL LOW (ref 3.5–5.2)
Alkaline Phosphatase: 51 U/L (ref 39–117)
Anion gap: 9 (ref 5–15)
BUN: 14 mg/dL (ref 6–23)
CALCIUM: 8.6 mg/dL (ref 8.4–10.5)
CO2: 29 mmol/L (ref 19–32)
Chloride: 101 mmol/L (ref 96–112)
Creatinine, Ser: 0.91 mg/dL (ref 0.50–1.35)
GFR, EST NON AFRICAN AMERICAN: 81 mL/min — AB (ref >90)
Glucose, Bld: 117 mg/dL — ABNORMAL HIGH (ref 70–99)
Potassium: 3.8 mmol/L (ref 3.5–5.1)
Sodium: 139 mmol/L (ref 135–145)
TOTAL PROTEIN: 6.6 g/dL (ref 6.0–8.3)
Total Bilirubin: 0.2 mg/dL — ABNORMAL LOW (ref 0.3–1.2)

## 2014-11-16 LAB — CBC WITH DIFFERENTIAL/PLATELET
BASOS ABS: 0.1 10*3/uL (ref 0.0–0.1)
BASOS PCT: 1 % (ref 0–1)
EOS ABS: 0.2 10*3/uL (ref 0.0–0.7)
EOS PCT: 2 % (ref 0–5)
HEMATOCRIT: 35.9 % — AB (ref 39.0–52.0)
Hemoglobin: 10.6 g/dL — ABNORMAL LOW (ref 13.0–17.0)
Lymphocytes Relative: 10 % — ABNORMAL LOW (ref 12–46)
Lymphs Abs: 0.8 10*3/uL (ref 0.7–4.0)
MCH: 23.6 pg — AB (ref 26.0–34.0)
MCHC: 29.5 g/dL — AB (ref 30.0–36.0)
MCV: 80 fL (ref 78.0–100.0)
MONO ABS: 0.7 10*3/uL (ref 0.1–1.0)
Monocytes Relative: 8 % (ref 3–12)
NEUTROS ABS: 6.3 10*3/uL (ref 1.7–7.7)
Neutrophils Relative %: 79 % — ABNORMAL HIGH (ref 43–77)
Platelets: 206 10*3/uL (ref 150–400)
RBC: 4.49 MIL/uL (ref 4.22–5.81)
RDW: 16.4 % — AB (ref 11.5–15.5)
WBC: 7.9 10*3/uL (ref 4.0–10.5)

## 2014-11-16 LAB — PROTIME-INR
INR: 2.04 — ABNORMAL HIGH (ref 0.00–1.49)
Prothrombin Time: 23.3 seconds — ABNORMAL HIGH (ref 11.6–15.2)

## 2014-11-16 NOTE — ED Notes (Signed)
Pt fell today hitting right side of head and right arm.  Thinks that he may have passed out.  States that he does not remember much about the fall.

## 2014-11-16 NOTE — ED Provider Notes (Signed)
CSN: [id number]     Arrival date & time 11/16/14  1438 History  This chart was scribe for Dennis Furry, MD by Judithann Sauger, ED Scribe. The patient was seen in room APA11/APA11 and the patient's care was started at 3:08 PM.     Chief Complaint  Patient presents with  . Fall  . Loss of Consciousness   Patient is a 74 y.o. male presenting with syncope. The history is provided by the patient. No language interpreter was used.  Loss of Consciousness Associated symptoms: confusion   Associated symptoms: no diaphoresis, no dizziness, no fever, no nausea, no shortness of breath and no vomiting    HPI Comments: Dennis Ray is a 74 y.o. male with a hx of TIA who presents to the Emergency Department status post fall that occurred today. He reports a fall while he was sitting at the desk and he hit the back of his head and right arm. He denies cough, SOB. He states that he had been on Coumadin but stopped 2 days ago, 2/2 a land colonoscopy on Friday.Dennis Ray His wife reports that Dr. Willey Blade asked him to get a CT scan due to the fall. She also adds that a couple hours after the fall he had some difficulty remembering that he was supposed to take his son to work.  .  Wife states that now he seems at his baseline. He does not have weakness or symptoms that he has had the past with TIAs. Recent diagnosis of pneumonia. First and buttocks today. States he feels "well" no dyspnea shortness of breath on exertion. Has not had any dark or black stools. States that his last hemoglobin had been "about 10" per his report.   Past Medical History  Diagnosis Date  . Hypertension   . Coronary artery disease     IMI in 09/1999->RCA stent, nl LAD, 30%, distal OM, RCA  95% distal lesion; nuclear in 07/2010-moderate-sized Inferior infarction; normal EF by echo in 07/2010  . Atrial flutter     Previously not any Coumadin candidate as the result of chronic GI blood loss  . Hyperlipidemia   . Alcohol abuse, in remission   .  Ulcerative colitis     s/p subtotal colectomy  . GERD (gastroesophageal reflux disease)   . Myocardial infarction   . Sleep apnea     STOP BANG score of 4   Past Surgical History  Procedure Laterality Date  . Coronary stent placement    . Subtotal colectomy  2012    secondary to ulcerative colitis and LGI bleeding  . Colonoscopy  08/2010    UNDER FLURO  . Colonoscopy  04/02/07    INCOMPLT  . Colonoscopy  12/04/06    INCOMP  . Colonoscopy  09/25/04    ROURK  . Upper gastrointestinal endoscopy  08/18/2010    NUR  . Cataract extraction w/phaco  02/26/2012    Procedure: CATARACT EXTRACTION PHACO AND INTRAOCULAR LENS PLACEMENT (IOC);  Surgeon: Elta Guadeloupe T. Gershon Crane, MD;  Location: [facility];  Service: Ophthalmology;  Laterality: Left;  CDE 8.94   Family History  Problem Relation Age of Onset  . Healthy Sister   . Irritable bowel syndrome Daughter   . Anesthesia problems Neg Hx   . Malignant hyperthermia Neg Hx   . Pseudochol deficiency Neg Hx    History  Substance Use Topics  . Smoking status: Never Smoker   . Smokeless tobacco: Never Used  . Alcohol Use: 0.0 oz/week    0 Standard  drinks or equivalent per week     Comment: 12oz beer 1-2 times weekly    Review of Systems  Constitutional: Negative for fever, chills, diaphoresis, appetite change and fatigue.  HENT: Negative for mouth sores, sore throat and trouble swallowing.   Eyes: Negative for visual disturbance.  Respiratory: Negative for cough, chest tightness, shortness of breath and wheezing.   Cardiovascular: Positive for syncope.  Gastrointestinal: Negative for nausea, vomiting, diarrhea and abdominal distention.  Endocrine: Negative for polydipsia, polyphagia and polyuria.  Genitourinary: Negative for dysuria, frequency and hematuria.  Musculoskeletal: Negative for gait problem.  Skin: Positive for wound. Negative for color change, pallor and rash.  Neurological: Negative for dizziness, syncope and light-headedness.   Hematological: Does not bruise/bleed easily.  Psychiatric/Behavioral: Positive for confusion. Negative for behavioral problems.      Allergies  Doxycycline  Home Medications   Prior to Admission medications   Medication Sig Start Date End Date Taking? Authorizing Provider  amLODipine (NORVASC) 2.5 MG tablet Take 5 mg by mouth daily.  02/25/12   Yehuda Savannah, MD  cloNIDine (CATAPRES) 0.2 MG tablet Take 0.2 mg by mouth 2 (two) times daily.    Historical Provider, MD  DELZICOL 400 MG CPDR DR capsule TAKE 2 CAPSULES BY MOUTH TWICE DAILY. 11/04/14   Rona Ravens Setzer, NP  fluticasone (CUTIVATE) 0.05 % cream Apply 1 application topically as needed.  08/14/14   Historical Provider, MD  furosemide (LASIX) 40 MG tablet Take 40 mg by mouth daily.      Historical Provider, MD  HYDROcodone-acetaminophen (NORCO) 7.5-325 MG per tablet Take 1 tablet by mouth 3 (three) times daily as needed for moderate pain.     Historical Provider, MD  levofloxacin (LEVAQUIN) 500 MG tablet Take 1 tablet (500 mg total) by mouth daily. 11/11/14   Virgel Manifold, MD  lisinopril (PRINIVIL,ZESTRIL) 40 MG tablet Take 40 mg by mouth daily.    Historical Provider, MD  metoprolol (LOPRESSOR) 50 MG tablet Take 50 mg by mouth 2 (two) times daily.      Historical Provider, MD  nitroGLYCERIN (NITROSTAT) 0.4 MG SL tablet Place 1 tablet (0.4 mg total) under the tongue every 5 (five) minutes x 3 doses as needed. 07/30/14   Arnoldo Lenis, MD  polyethylene glycol-electrolytes (NULYTELY/GOLYTELY) 420 G solution Take 4,000 mLs by mouth once. 11/09/14   Butch Penny, NP  potassium chloride SA (K-DUR,KLOR-CON) 20 MEQ tablet Take 20 mEq by mouth daily.      Historical Provider, MD  simvastatin (ZOCOR) 20 MG tablet Take 20 mg by mouth at bedtime.      Historical Provider, MD  terazosin (HYTRIN) 5 MG capsule TAKE 2 CAPSULES BY MOUTH ONCE DAILY. 07/02/13   Lendon Colonel, NP  warfarin (COUMADIN) 2 MG tablet Take 2 mg by mouth daily at 6  PM. Take 1 tablet daily or as directed 02/26/14   Lendon Colonel, NP  zolpidem (AMBIEN) 10 MG tablet Take 10 mg by mouth at bedtime.  07/05/14   Historical Provider, MD   BP 118/52 mmHg  Pulse 48  Temp(Src) 97.9 F (36.6 C) (Oral)  Resp 20  Ht 6\' 5"  (1.956 m)  Wt 300 lb (136.079 kg)  BMI 35.57 kg/m2  SpO2 92% Physical Exam  Constitutional: He is oriented to person, place, and time. He appears well-developed and well-nourished. No distress.  HENT:  Head: Normocephalic.  Eyes: Conjunctivae are normal. Pupils are equal, round, and reactive to light. No scleral icterus.  Neck: Normal  range of motion. Neck supple. No thyromegaly present.  Cardiovascular: Normal rate and regular rhythm.  Exam reveals no gallop and no friction rub.   No murmur heard. Pulmonary/Chest: Effort normal and breath sounds normal. No respiratory distress. He has no wheezes. He has no rales.  Abdominal: Soft. Bowel sounds are normal. He exhibits no distension. There is no tenderness. There is no rebound.  Musculoskeletal: Normal range of motion.  Neurological: He is alert and oriented to person, place, and time.  Skin: Skin is warm and dry. No rash noted.  Superficial abrasion over right mastoid Abrasion on right forearm  Psychiatric: He has a normal mood and affect. His behavior is normal.  Nursing note and vitals reviewed.   ED Course  Procedures (including critical care time) DIAGNOSTIC STUDIES: Oxygen Saturation is 99% on RA, normal by my interpretation.    COORDINATION OF CARE: 3:15 PM- Pt advised of plan for treatment and pt agrees.    Labs Review Labs Reviewed  CBC WITH DIFFERENTIAL/PLATELET - Abnormal; Notable for the following:    Hemoglobin 10.6 (*)    HCT 35.9 (*)    MCH 23.6 (*)    MCHC 29.5 (*)    RDW 16.4 (*)    Neutrophils Relative % 79 (*)    Lymphocytes Relative 10 (*)    All other components within normal limits  COMPREHENSIVE METABOLIC PANEL - Abnormal; Notable for the  following:    Glucose, Bld 117 (*)    Albumin 3.2 (*)    Total Bilirubin 0.2 (*)    GFR calc non Af Amer 81 (*)    All other components within normal limits  PROTIME-INR - Abnormal; Notable for the following:    Prothrombin Time 23.3 (*)    INR 2.04 (*)    All other components within normal limits    Imaging Review Dg Chest 2 View  11/16/2014   CLINICAL DATA:  Shortness of breath  EXAM: CHEST  2 VIEW  COMPARISON:  11/11/2014  FINDINGS: Cardiac shadow is within normal limits. The previously seen right middle lobe infiltrate has improved significantly with only mild residual density seen. No new focal infiltrate is noted. No bony abnormality seen.  IMPRESSION: Improving right middle lobe infiltrate.   Electronically Signed   By: Inez Catalina M.D.   On: 11/16/2014 16:22   Ct Head Wo Contrast  11/16/2014   CLINICAL DATA:  Fall at desk. Hit posterior right head on floor. Possible loss of consciousness. History of hypertension. Myocardial infarction.  EXAM: CT HEAD WITHOUT CONTRAST  TECHNIQUE: Contiguous axial images were obtained from the base of the skull through the vertex without intravenous contrast.  COMPARISON:  None.  FINDINGS: Sinuses/Soft tissues: Moderate right parietal scalp soft tissue swelling, including on image 30 of series 3. Hypoplastic frontal sinuses. Other paranasal sinuses and mastoid air cells are clear. Mild hyperostosis frontalis interna. No skull fracture.  Intracranial: Mild low density in the periventricular white matter likely related to small vessel disease. No mass lesion, hemorrhage, hydrocephalus, acute infarct, intra-axial, or extra-axial fluid collection. Bilateral carotid atherosclerosis. Right vertebral artery atherosclerosis.  IMPRESSION: 1. Right parietal scalp soft tissue swelling, without acute intracranial abnormality. 2. Small vessel ischemic change and atherosclerosis.   Electronically Signed   By: Abigail Miyamoto M.D.   On: 11/16/2014 16:39     EKG  Interpretation   Date/Time:  Tuesday November 16 2014 15:28:51 EST Ventricular Rate:  46 PR Interval:    QRS Duration: 119 QT Interval:  491  QTC Calculation: 429 R Axis:   74 Text Interpretation:  Atrial fibrillation Nonspecific intraventricular  conduction delay Inferior infarct, old Consider anterolateral infarct  Confirmed by Jeneen Rinks  MD, [place] ([id number]) on 11/16/2014 5:03:32 PM      MDM   Final diagnoses:  SOB (shortness of breath)  Fall    Patient H her fibrillation, has a history of the same. Hemoglobin is 10.  His report that his his norm recently. Heart rate varies between 48 and 70 with A. fib. He is ambulatory without hypoxemia or symptoms at this time. I do not think this is a syncopal episode. He adamantly wants to be discharged.  I do not think that this is inappropriate.  Dennis Furry, MD 11/16/14 [phone]

## 2014-11-16 NOTE — Discharge Instructions (Signed)
Recheck with her primary care physician.

## 2014-11-18 DIAGNOSIS — K210 Gastro-esophageal reflux disease with esophagitis: Secondary | ICD-10-CM | POA: Diagnosis not present

## 2014-11-18 DIAGNOSIS — Q2733 Arteriovenous malformation of digestive system vessel: Secondary | ICD-10-CM | POA: Diagnosis not present

## 2014-11-18 DIAGNOSIS — D509 Iron deficiency anemia, unspecified: Secondary | ICD-10-CM | POA: Diagnosis not present

## 2014-11-18 DIAGNOSIS — K5100 Ulcerative (chronic) pancolitis without complications: Secondary | ICD-10-CM

## 2014-11-18 DIAGNOSIS — R195 Other fecal abnormalities: Secondary | ICD-10-CM | POA: Diagnosis not present

## 2014-11-18 DIAGNOSIS — K317 Polyp of stomach and duodenum: Secondary | ICD-10-CM | POA: Diagnosis not present

## 2014-11-18 DIAGNOSIS — D125 Benign neoplasm of sigmoid colon: Secondary | ICD-10-CM | POA: Diagnosis not present

## 2014-11-19 ENCOUNTER — Ambulatory Visit (HOSPITAL_COMMUNITY)
Admission: RE | Admit: 2014-11-19 | Discharge: 2014-11-19 | Disposition: A | Discharge status: Home / Self Care | Payer: Medicare Other | Source: Ambulatory Visit | Attending: Internal Medicine | Admitting: Internal Medicine

## 2014-11-19 ENCOUNTER — Encounter (HOSPITAL_COMMUNITY): Payer: Self-pay | Admitting: *Deleted

## 2014-11-19 ENCOUNTER — Telehealth (INDEPENDENT_AMBULATORY_CARE_PROVIDER_SITE_OTHER): Payer: Self-pay | Admitting: *Deleted

## 2014-11-19 ENCOUNTER — Encounter (HOSPITAL_COMMUNITY)
Admission: RE | Disposition: A | Discharge status: Home / Self Care | Payer: Self-pay | Source: Ambulatory Visit | Attending: Internal Medicine

## 2014-11-19 DIAGNOSIS — K51918 Ulcerative colitis, unspecified with other complication: Secondary | ICD-10-CM

## 2014-11-19 DIAGNOSIS — R195 Other fecal abnormalities: Secondary | ICD-10-CM

## 2014-11-19 DIAGNOSIS — D125 Benign neoplasm of sigmoid colon: Secondary | ICD-10-CM | POA: Diagnosis not present

## 2014-11-19 DIAGNOSIS — I10 Essential (primary) hypertension: Secondary | ICD-10-CM | POA: Insufficient documentation

## 2014-11-19 DIAGNOSIS — Z7951 Long term (current) use of inhaled steroids: Secondary | ICD-10-CM | POA: Insufficient documentation

## 2014-11-19 DIAGNOSIS — K5190 Ulcerative colitis, unspecified, without complications: Secondary | ICD-10-CM | POA: Diagnosis not present

## 2014-11-19 DIAGNOSIS — E785 Hyperlipidemia, unspecified: Secondary | ICD-10-CM | POA: Diagnosis not present

## 2014-11-19 DIAGNOSIS — Z955 Presence of coronary angioplasty implant and graft: Secondary | ICD-10-CM | POA: Diagnosis not present

## 2014-11-19 DIAGNOSIS — I252 Old myocardial infarction: Secondary | ICD-10-CM | POA: Diagnosis not present

## 2014-11-19 DIAGNOSIS — Z79891 Long term (current) use of opiate analgesic: Secondary | ICD-10-CM | POA: Insufficient documentation

## 2014-11-19 DIAGNOSIS — I2510 Atherosclerotic heart disease of native coronary artery without angina pectoris: Secondary | ICD-10-CM | POA: Insufficient documentation

## 2014-11-19 DIAGNOSIS — K5520 Angiodysplasia of colon without hemorrhage: Secondary | ICD-10-CM | POA: Insufficient documentation

## 2014-11-19 DIAGNOSIS — K219 Gastro-esophageal reflux disease without esophagitis: Secondary | ICD-10-CM | POA: Diagnosis not present

## 2014-11-19 DIAGNOSIS — Z9049 Acquired absence of other specified parts of digestive tract: Secondary | ICD-10-CM | POA: Insufficient documentation

## 2014-11-19 DIAGNOSIS — D509 Iron deficiency anemia, unspecified: Secondary | ICD-10-CM | POA: Diagnosis not present

## 2014-11-19 DIAGNOSIS — Z7901 Long term (current) use of anticoagulants: Secondary | ICD-10-CM | POA: Insufficient documentation

## 2014-11-19 DIAGNOSIS — G4730 Sleep apnea, unspecified: Secondary | ICD-10-CM | POA: Insufficient documentation

## 2014-11-19 DIAGNOSIS — K317 Polyp of stomach and duodenum: Secondary | ICD-10-CM | POA: Diagnosis not present

## 2014-11-19 HISTORY — PX: ESOPHAGOGASTRODUODENOSCOPY: SHX5428

## 2014-11-19 HISTORY — PX: COLONOSCOPY: SHX5424

## 2014-11-19 SURGERY — COLONOSCOPY
Anesthesia: Moderate Sedation

## 2014-11-19 MED ORDER — SODIUM CHLORIDE 0.9 % IV SOLN
INTRAVENOUS | Status: DC
  Administered 2014-11-19: 08:00:00 via INTRAVENOUS

## 2014-11-19 MED ORDER — MIDAZOLAM HCL 5 MG/5ML IJ SOLN
INTRAMUSCULAR | Status: DC | PRN
  Administered 2014-11-19 (×3): 2 mg via INTRAVENOUS

## 2014-11-19 MED ORDER — MIDAZOLAM HCL 5 MG/5ML IJ SOLN
INTRAMUSCULAR | Status: AC
  Filled 2014-11-19: qty 10

## 2014-11-19 MED ORDER — FERROUS GLUCONATE 324 (38 FE) MG PO TABS: 324.0000 mg | ORAL_TABLET | Freq: Every day | ORAL | Status: DC

## 2014-11-19 MED ORDER — MEPERIDINE HCL 50 MG/ML IJ SOLN
INTRAMUSCULAR | Status: DC | PRN
  Administered 2014-11-19 (×2): 25 mg via INTRAVENOUS

## 2014-11-19 MED ORDER — MEPERIDINE HCL 50 MG/ML IJ SOLN
INTRAMUSCULAR | Status: AC
  Filled 2014-11-19: qty 1

## 2014-11-19 MED ORDER — BUTAMBEN-TETRACAINE-BENZOCAINE 2-2-14 % EX AERO
INHALATION_SPRAY | CUTANEOUS | Status: DC | PRN
  Administered 2014-11-19: 1 via TOPICAL

## 2014-11-19 MED ORDER — STERILE WATER FOR IRRIGATION IR SOLN
Status: DC | PRN
  Administered 2014-11-19: 09:00:00

## 2014-11-19 NOTE — Op Note (Addendum)
EGD PROCEDURE REPORT  PATIENT:  Dennis Ray  MR#:  [id number] Birthdate:  04/04/1941, 74 y.o., male Endoscopist:  Dr. Rogene Houston, MD Referred By:  Dr. Asencion Noble, MD  Procedure Date: 11/19/2014  Procedure:   EGD & Colonoscopy  Indications:  Patient is 74 year old Caucasian male was chronic ulcerative colitis and underwent extended right hemicolectomy and generally 2012 for recurrent GI bleed and nonhealing ulcers in proximal colon. He has remained in remission. Last month he was noted to have drop in his hemoglobin by 2.3 g. Hemoccults were checked and all 3 were positive and iron studies show iron deficiency anemia. He denies melena or rectal bleeding. He is undergoing diagnostic EGD and colonoscopy.            Informed Consent:  The risks, benefits, alternatives & imponderables which include, but are not limited to, bleeding, infection, perforation, drug reaction and potential missed lesion have been reviewed.  The potential for biopsy, lesion removal, esophageal dilation, etc. have also been discussed.  Questions have been answered.  All parties agreeable.  Please see history & physical in medical record for more information.  Medications:  Demerol 50 mg IV Versed 6 mg IV Cetacaine spray topically for oropharyngeal anesthesia  EGD  Description of procedure:  The endoscope was introduced through the mouth and advanced to the second portion of the duodenum without difficulty or limitations. The mucosal surfaces were surveyed very carefully during advancement of the scope and upon withdrawal.  Findings:  Esophagus:  Mucosa of the esophagus was normal. GE junction was unremarkable. GEJ:  46 cm Stomach:  Stomach was empty and distended very well with insufflation. Folds in the proximal stomach were normal. Examination of mucosa at gastric body few small hyperplastic appearing polyps. These were left alone. Antral mucosa was normal. Pyloric channel was patent. Tenderness fundus and cardia  were unremarkable. Duodenum:  Normal bulbar and post bulbar mucosa.  Therapeutic/Diagnostic Maneuvers Performed:  None  COLONOSCOPY Description of procedure:  After a digital rectal exam was performed, that colonoscope was advanced from the anus through the rectum and colon to the area of ileocolonic anastomosis which was located at 60 cm from the anal margin. Scope was advanced into distal small bowel and then was slowly and cautiously withdrawn. The mucosal surfaces were carefully surveyed utilizing scope tip to flexion to facilitate fold flattening as needed. The scope was pulled down into the rectum where a thorough exam including retroflexion was performed.  Findings:   Prep excellent. Normal mucosa of distal small bowel. Focal edema and erythema at ileocolonic anastomosis with single erosion. 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 5 mm polyp cold snared from mid sigmoid colon. Normal rectal mucosa and anal rectal junction.  Therapeutic/Diagnostic Maneuvers Performed:  See above  Complications:  None  Cecal Withdrawal Time:  10 minutes  Impression:  EGD findings; Few small hyperplastic polyps gastric body otherwise normal EGD. No mucosal abnormality noted involving duodenal mucosa to suggest celiac disease.  Colonoscopy findings; Normal mucosa of distal small bowel. Mild inflammatory changes at ileocolonic anastomosis without stricture. 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   Recommendations:  Standard instructions given. Patient will resume warfarin at usual dose and get INR checked in 7-10 days. Ferrous gluconate 324 mg by mouth daily. I will be contacting patient with biopsy results. CBC in one month. Patient advised to contact the office if he has melena or frank rectal bleeding.  REHMAN,NAJEEB U  11/19/2014  9:17 AM  CC: Dr. Asencion Noble, MD & Dr. Rayne Du ref. provider found

## 2014-11-19 NOTE — Discharge Instructions (Signed)
Resume usual medications including warfarin. Ferrous gluconate 324 mg by mouth daily with breakfast. No driving for 24 hours. INR in 7-10 days. Physician will call with biopsy results.   Gastrointestinal Endoscopy, Care After Refer to this sheet in the next few weeks. These instructions provide you with information on caring for yourself after your procedure. Your caregiver may also give you more specific instructions. Your treatment has been planned according to current medical practices, but problems sometimes occur. Call your caregiver if you have any problems or questions after your procedure. HOME CARE INSTRUCTIONS  If you were given medicine to help you relax (sedative), do not drive, operate machinery, or sign important documents for 24 hours.  Avoid alcohol and hot or warm beverages for the first 24 hours after the procedure.  Only take over-the-counter or prescription medicines for pain, discomfort, or fever as directed by your caregiver. You may resume taking your normal medicines unless your caregiver tells you otherwise. Ask your caregiver when you may resume taking medicines that may cause bleeding, such as aspirin, clopidogrel, or warfarin.  You may return to your normal diet and activities on the day after your procedure, or as directed by your caregiver. Walking may help to reduce any bloated feeling in your abdomen.  Drink enough fluids to keep your urine clear or pale yellow.  You may gargle with salt water if you have a sore throat. SEEK IMMEDIATE MEDICAL CARE IF:  You have severe nausea or vomiting.  You have severe abdominal pain, abdominal cramps that last longer than 6 hours, or abdominal swelling (distention).  You have severe shoulder or back pain.  You have trouble swallowing.  You have shortness of breath, your breathing is shallow, or you are breathing faster than normal.  You have a fever or a rapid heartbeat.  You vomit blood or material that looks  like coffee grounds.  You have bloody, black, or tarry stools. MAKE SURE YOU:  Understand these instructions.  Will watch your condition.  Will get help right away if you are not doing well or get worse. Document Released: 04/17/2004 Document Revised: 01/18/2014 Document Reviewed: 12/04/2011 [facility] Patient Information 2015 [place], [place]. This information is not intended to replace advice given to you by your health care provider. Make sure you discuss any questions you have with your health care provider.

## 2014-11-19 NOTE — Telephone Encounter (Signed)
Per Dr.Rehman the patient will need to have labs drawn in 1 month. Lab noted for 12/20/14.

## 2014-11-19 NOTE — H&P (Addendum)
Dennis Ray is an 74 y.o. male.   Chief Complaint: Patient is here for EGD and colonoscopy. HPI: Patient is 74 year old Caucasian male who has history of ulcerative colitis status post right hemicolectomy from nonhealing ulcers and recurrent bleeding about 4 years ago and has remained in remission on oral mesalamine.  Patient was noted to have drop in hemoglobin from 14.4 12.1. 3 out of 3 Hemoccults were positive. Iron studies confirmed iron deficiency anemia. He denies abdominal pain melena or rectal bleeding. He occasionally may see part of the stool to be dark what he describes to be black patch.  He does not take OTC NSAIDs. Patient states he was treated with Levaquin for for pneumonia which he took for 7 days. He was seen in emergency room on 11/11/2014. Patient has been off warfarin for these procedures.   Past Medical History  Diagnosis Date  . Hypertension   . Coronary artery disease     IMI in 09/1999->RCA stent, nl LAD, 30%, distal OM, RCA  95% distal lesion; nuclear in 07/2010-moderate-sized Inferior infarction; normal EF by echo in 07/2010  . Atrial flutter     Previously not any Coumadin candidate as the result of chronic GI blood loss  . Hyperlipidemia   . Alcohol abuse, in remission   . Ulcerative colitis     s/p subtotal colectomy  . GERD (gastroesophageal reflux disease)   . Myocardial infarction   . Sleep apnea     STOP BANG score of 4    Past Surgical History  Procedure Laterality Date  . Coronary stent placement    . Subtotal colectomy  2012    secondary to ulcerative colitis and LGI bleeding  . Colonoscopy  08/2010    UNDER FLURO  . Colonoscopy  04/02/07    INCOMPLT  . Colonoscopy  12/04/06    INCOMP  . Colonoscopy  09/25/04    ROURK  . Upper gastrointestinal endoscopy  08/18/2010    NUR  . Cataract extraction w/phaco  02/26/2012    Procedure: CATARACT EXTRACTION PHACO AND INTRAOCULAR LENS PLACEMENT (IOC);  Surgeon: Elta Guadeloupe T. Gershon Crane, MD;  Location: [facility];   Service: Ophthalmology;  Laterality: Left;  CDE 8.94  . Cardiac catheterization      Family History  Problem Relation Age of Onset  . Healthy Sister   . Irritable bowel syndrome Daughter   . Anesthesia problems Neg Hx   . Malignant hyperthermia Neg Hx   . Pseudochol deficiency Neg Hx    Social History:  reports that he has never smoked. He has never used smokeless tobacco. He reports that he drinks alcohol. He reports that he does not use illicit drugs.  Allergies:  Allergies  Allergen Reactions  . Doxycycline     THROAT SWELLS    Medications Prior to Admission  Medication Sig Dispense Refill  . amLODipine (NORVASC) 2.5 MG tablet Take 5 mg by mouth daily.     . cloNIDine (CATAPRES) 0.2 MG tablet Take 0.2 mg by mouth 2 (two) times daily.    . DELZICOL 400 MG CPDR DR capsule TAKE 2 CAPSULES BY MOUTH TWICE DAILY. 120 capsule 6  . fluticasone (CUTIVATE) 0.05 % cream Apply 1 application topically as needed.     . furosemide (LASIX) 40 MG tablet Take 40 mg by mouth daily.      [name] HYDROcodone-acetaminophen (NORCO) 7.5-325 MG per tablet Take 1 tablet by mouth 3 (three) times daily as needed for moderate pain.     [name]  0  . lisinopril (PRINIVIL,ZESTRIL) 40 MG tablet Take 40 mg by mouth daily.    . metoprolol (LOPRESSOR) 50 MG tablet Take 50 mg by mouth 2 (two) times daily.      . polyethylene glycol-electrolytes (NULYTELY/GOLYTELY) 420 G solution Take 4,000 mLs by mouth once. 4000 mL 0  . potassium chloride SA (K-DUR,KLOR-CON) 20 MEQ tablet Take 20 mEq by mouth daily.      . simvastatin (ZOCOR) 20 MG tablet Take 20 mg by mouth at bedtime.      [name] terazosin (HYTRIN) 5 MG capsule TAKE 2 CAPSULES BY MOUTH ONCE DAILY. 60 capsule 6  . warfarin (COUMADIN) 2 MG tablet Take 2 mg by mouth daily at 6 PM. Take 1 tablet daily or as directed    . zolpidem (AMBIEN) 10 MG tablet Take 10 mg by mouth at bedtime.     . nitroGLYCERIN (NITROSTAT) 0.4 MG SL tablet Place 1 tablet (0.4 mg total) under the tongue  every 5 (five) minutes x 3 doses as needed. 25 tablet 2    No results found for this or any previous visit (from the past 48 hour(s)). No results found.  ROS  Blood pressure 157/97, pulse 61, temperature 97.8 F (36.6 C), temperature source Oral, resp. rate 14, height 6\' 5"  (1.956 m), weight 300 lb (136.079 kg), SpO2 93 %. Physical Exam  Constitutional: He appears well-developed and well-nourished.  HENT:  Mouth/Throat: Oropharynx is clear and moist.  Eyes: Conjunctivae are normal. No scleral icterus.  Neck: No thyromegaly present.  Cardiovascular:  Irregular rhythm normal S1 and S2. No murmur or gallop.  Respiratory: Effort normal and breath sounds normal.  GI: Soft. He exhibits no distension and no mass. There is no tenderness.  Musculoskeletal: Edema: 2+ pitting edema involving both legs.  Lymphadenopathy:    He has no cervical adenopathy.  Neurological: He is alert.  Skin: Skin is warm and dry.     Assessment/Plan Heme positive stool and iron deficiency anemia. Chronic ulcerative colitis. Diagnostic EGD and colonoscopy.  Dennis Ray 11/19/2014, 8:27 AM

## 2014-11-22 ENCOUNTER — Encounter (HOSPITAL_COMMUNITY): Payer: Self-pay | Admitting: Internal Medicine

## 2014-11-23 ENCOUNTER — Encounter (INDEPENDENT_AMBULATORY_CARE_PROVIDER_SITE_OTHER): Payer: Self-pay | Admitting: *Deleted

## 2014-11-29 ENCOUNTER — Ambulatory Visit (INDEPENDENT_AMBULATORY_CARE_PROVIDER_SITE_OTHER): Payer: Medicare Other | Admitting: *Deleted

## 2014-11-29 DIAGNOSIS — Z5181 Encounter for therapeutic drug level monitoring: Secondary | ICD-10-CM

## 2014-11-29 DIAGNOSIS — I480 Paroxysmal atrial fibrillation: Secondary | ICD-10-CM | POA: Diagnosis not present

## 2014-11-29 DIAGNOSIS — I4891 Unspecified atrial fibrillation: Secondary | ICD-10-CM | POA: Diagnosis not present

## 2014-11-29 DIAGNOSIS — Z7901 Long term (current) use of anticoagulants: Secondary | ICD-10-CM

## 2014-11-29 LAB — POCT INR: INR: 1.7

## 2014-12-06 ENCOUNTER — Other Ambulatory Visit: Payer: Self-pay | Admitting: Adult Health

## 2014-12-14 ENCOUNTER — Telehealth (INDEPENDENT_AMBULATORY_CARE_PROVIDER_SITE_OTHER): Payer: Self-pay | Admitting: *Deleted

## 2014-12-14 NOTE — Telephone Encounter (Addendum)
Dennis Ray would like to get his test results and needed to know if he should be taking iron. His return phone number is [phone].

## 2014-12-15 ENCOUNTER — Other Ambulatory Visit (INDEPENDENT_AMBULATORY_CARE_PROVIDER_SITE_OTHER): Payer: Self-pay | Admitting: *Deleted

## 2014-12-15 ENCOUNTER — Encounter (INDEPENDENT_AMBULATORY_CARE_PROVIDER_SITE_OTHER): Payer: Self-pay | Admitting: *Deleted

## 2014-12-15 DIAGNOSIS — K51918 Ulcerative colitis, unspecified with other complication: Secondary | ICD-10-CM

## 2014-12-15 NOTE — Telephone Encounter (Signed)
Patient was asking the result of his recent biopsy,which Dr.Rehman has already reviewed with him Patient was to get lab work on 12/20/14. He has not been taking his Ferrous Gluconate 324 mg daily.  Per Dr.Rehman the patient should not get his lab work 12/20/14. He should start the Iron then get his lab work 2 weeks after having started the medication.  Rx was called to [facility] as follows - Ferrous Gluconate 324 mg (Fergon) - Take 1 by mouth daily at breakfast 1 month 3 refills.  Patient was called and made aware. He states that he knew nothing about taking the medication. Patient will get labs 04/18-04/19.

## 2014-12-20 ENCOUNTER — Ambulatory Visit (INDEPENDENT_AMBULATORY_CARE_PROVIDER_SITE_OTHER): Payer: Medicare Other | Admitting: *Deleted

## 2014-12-20 DIAGNOSIS — I480 Paroxysmal atrial fibrillation: Secondary | ICD-10-CM

## 2014-12-20 DIAGNOSIS — I4891 Unspecified atrial fibrillation: Secondary | ICD-10-CM

## 2014-12-20 DIAGNOSIS — Z5181 Encounter for therapeutic drug level monitoring: Secondary | ICD-10-CM | POA: Diagnosis not present

## 2014-12-20 DIAGNOSIS — K51918 Ulcerative colitis, unspecified with other complication: Secondary | ICD-10-CM | POA: Diagnosis not present

## 2014-12-20 DIAGNOSIS — Z7901 Long term (current) use of anticoagulants: Secondary | ICD-10-CM

## 2014-12-20 DIAGNOSIS — K5190 Ulcerative colitis, unspecified, without complications: Secondary | ICD-10-CM | POA: Diagnosis not present

## 2014-12-20 LAB — POCT INR: INR: 2.4

## 2014-12-21 LAB — CBC
HCT: 37.3 % — ABNORMAL LOW (ref 39.0–52.0)
Hemoglobin: 11 g/dL — ABNORMAL LOW (ref 13.0–17.0)
MCH: 23.4 pg — ABNORMAL LOW (ref 26.0–34.0)
MCHC: 29.5 g/dL — ABNORMAL LOW (ref 30.0–36.0)
MCV: 79.4 fL (ref 78.0–100.0)
MPV: 10.5 fL (ref 8.6–12.4)
Platelets: 164 10*3/uL (ref 150–400)
RBC: 4.7 MIL/uL (ref 4.22–5.81)
RDW: 17.8 % — ABNORMAL HIGH (ref 11.5–15.5)
WBC: 6.6 10*3/uL (ref 4.0–10.5)

## 2014-12-22 ENCOUNTER — Telehealth (INDEPENDENT_AMBULATORY_CARE_PROVIDER_SITE_OTHER): Payer: Self-pay | Admitting: *Deleted

## 2014-12-22 DIAGNOSIS — K51918 Ulcerative colitis, unspecified with other complication: Secondary | ICD-10-CM

## 2014-12-22 NOTE — Telephone Encounter (Signed)
Per Dr.Rehman the patient will need to have labs drawn in 2 months. 

## 2015-01-17 ENCOUNTER — Ambulatory Visit (INDEPENDENT_AMBULATORY_CARE_PROVIDER_SITE_OTHER): Payer: Medicare Other | Admitting: *Deleted

## 2015-01-17 DIAGNOSIS — I480 Paroxysmal atrial fibrillation: Secondary | ICD-10-CM

## 2015-01-17 DIAGNOSIS — Z7901 Long term (current) use of anticoagulants: Secondary | ICD-10-CM

## 2015-01-17 DIAGNOSIS — Z5181 Encounter for therapeutic drug level monitoring: Secondary | ICD-10-CM

## 2015-01-17 DIAGNOSIS — I4891 Unspecified atrial fibrillation: Secondary | ICD-10-CM | POA: Diagnosis not present

## 2015-01-17 LAB — POCT INR: INR: 2.4

## 2015-01-19 DIAGNOSIS — D509 Iron deficiency anemia, unspecified: Secondary | ICD-10-CM | POA: Diagnosis not present

## 2015-01-19 DIAGNOSIS — Z79899 Other long term (current) drug therapy: Secondary | ICD-10-CM | POA: Diagnosis not present

## 2015-01-27 DIAGNOSIS — H05229 Edema of unspecified orbit: Secondary | ICD-10-CM | POA: Diagnosis not present

## 2015-01-27 DIAGNOSIS — I10 Essential (primary) hypertension: Secondary | ICD-10-CM | POA: Diagnosis not present

## 2015-01-27 DIAGNOSIS — M545 Low back pain: Secondary | ICD-10-CM | POA: Diagnosis not present

## 2015-01-27 DIAGNOSIS — D649 Anemia, unspecified: Secondary | ICD-10-CM | POA: Diagnosis not present

## 2015-02-09 ENCOUNTER — Other Ambulatory Visit (INDEPENDENT_AMBULATORY_CARE_PROVIDER_SITE_OTHER): Payer: Self-pay | Admitting: *Deleted

## 2015-02-09 ENCOUNTER — Encounter (INDEPENDENT_AMBULATORY_CARE_PROVIDER_SITE_OTHER): Payer: Self-pay | Admitting: *Deleted

## 2015-02-09 DIAGNOSIS — K51918 Ulcerative colitis, unspecified with other complication: Secondary | ICD-10-CM

## 2015-02-11 ENCOUNTER — Encounter (INDEPENDENT_AMBULATORY_CARE_PROVIDER_SITE_OTHER): Payer: Self-pay

## 2015-02-21 ENCOUNTER — Encounter: Payer: Self-pay | Admitting: *Deleted

## 2015-04-07 ENCOUNTER — Other Ambulatory Visit: Payer: Self-pay | Admitting: Adult Health

## 2015-05-09 ENCOUNTER — Other Ambulatory Visit: Payer: Self-pay | Admitting: Cardiology

## 2015-05-27 ENCOUNTER — Ambulatory Visit (INDEPENDENT_AMBULATORY_CARE_PROVIDER_SITE_OTHER): Payer: Medicare Other | Admitting: *Deleted

## 2015-05-27 DIAGNOSIS — Z7901 Long term (current) use of anticoagulants: Secondary | ICD-10-CM | POA: Diagnosis not present

## 2015-05-27 DIAGNOSIS — I4891 Unspecified atrial fibrillation: Secondary | ICD-10-CM | POA: Diagnosis not present

## 2015-05-27 DIAGNOSIS — Z5181 Encounter for therapeutic drug level monitoring: Secondary | ICD-10-CM

## 2015-05-27 LAB — POCT INR: INR: 2.8

## 2015-06-06 ENCOUNTER — Encounter (INDEPENDENT_AMBULATORY_CARE_PROVIDER_SITE_OTHER): Payer: Self-pay | Admitting: *Deleted

## 2015-06-07 ENCOUNTER — Other Ambulatory Visit: Payer: Self-pay | Admitting: Cardiology

## 2015-06-24 DIAGNOSIS — E876 Hypokalemia: Secondary | ICD-10-CM | POA: Diagnosis not present

## 2015-06-24 DIAGNOSIS — Z79899 Other long term (current) drug therapy: Secondary | ICD-10-CM | POA: Diagnosis not present

## 2015-06-24 DIAGNOSIS — D649 Anemia, unspecified: Secondary | ICD-10-CM | POA: Diagnosis not present

## 2015-06-28 ENCOUNTER — Encounter (INDEPENDENT_AMBULATORY_CARE_PROVIDER_SITE_OTHER): Payer: Self-pay | Admitting: Internal Medicine

## 2015-06-28 ENCOUNTER — Ambulatory Visit (INDEPENDENT_AMBULATORY_CARE_PROVIDER_SITE_OTHER): Payer: Medicare Other | Admitting: Internal Medicine

## 2015-06-28 ENCOUNTER — Encounter: Payer: Self-pay | Admitting: Cardiology

## 2015-06-28 ENCOUNTER — Ambulatory Visit (INDEPENDENT_AMBULATORY_CARE_PROVIDER_SITE_OTHER): Payer: Medicare Other | Admitting: Cardiology

## 2015-06-28 VITALS — BP 132/72 | HR 71 | Ht 77.0 in | Wt 304.0 lb

## 2015-06-28 VITALS — BP 140/76 | HR 80 | Temp 97.4°F | Ht 75.0 in | Wt 301.3 lb

## 2015-06-28 DIAGNOSIS — I2510 Atherosclerotic heart disease of native coronary artery without angina pectoris: Secondary | ICD-10-CM

## 2015-06-28 DIAGNOSIS — E785 Hyperlipidemia, unspecified: Secondary | ICD-10-CM

## 2015-06-28 DIAGNOSIS — Z7901 Long term (current) use of anticoagulants: Secondary | ICD-10-CM

## 2015-06-28 DIAGNOSIS — K5120 Ulcerative (chronic) proctitis without complications: Secondary | ICD-10-CM | POA: Diagnosis not present

## 2015-06-28 DIAGNOSIS — I4891 Unspecified atrial fibrillation: Secondary | ICD-10-CM

## 2015-06-28 MED ORDER — ATORVASTATIN CALCIUM 80 MG PO TABS
80.0000 mg | ORAL_TABLET | Freq: Every day | ORAL | Status: DC
Start: 2015-06-28 — End: 2016-07-06

## 2015-06-28 NOTE — Patient Instructions (Signed)
OV in 1 year.  

## 2015-06-28 NOTE — Progress Notes (Signed)
Patient ID: Dennis Ray, male   DOB: October 26, 1940, 74 y.o.   MRN: [id number]     Clinical Summary Dennis Ray is a 74 y.o.male seen today for follow up of the following medical problems.  1. 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  - echo 05/2012 LVEF 55-60%  - denies any chest pain. Denies any SOB or DOE.  - compliant with meds  2. Afib - on coumadin, denies any bleeding issues  - isolated episode of palpitations over the last 6 months  3. Hyperlipidemia - compliant with statin - Jan 2015 TC 106 TG 185 HDL 24 LDL 45  4. HTN - does not check bp regularly - compliant with meds   Past Medical History  Diagnosis Date  . Hypertension   . Coronary artery disease     IMI in 09/1999->RCA stent, nl LAD, 30%, distal OM, RCA  95% distal lesion; nuclear in 07/2010-moderate-sized Inferior infarction; normal EF by echo in 07/2010  . Atrial flutter     Previously not any Coumadin candidate as the result of chronic GI blood loss  . Hyperlipidemia   . Alcohol abuse, in remission   . Ulcerative colitis     s/p subtotal colectomy  . GERD (gastroesophageal reflux disease)   . Myocardial infarction   . Sleep apnea     STOP BANG score of 4     Allergies  Allergen Reactions  . Doxycycline     THROAT SWELLS     Current Outpatient Prescriptions  Medication Sig Dispense Refill  . amLODipine (NORVASC) 2.5 MG tablet Take 5 mg by mouth daily.     . cloNIDine (CATAPRES) 0.2 MG tablet Take 0.2 mg by mouth 2 (two) times daily.    . DELZICOL 400 MG CPDR DR capsule TAKE 2 CAPSULES BY MOUTH TWICE DAILY. 120 capsule 6  . ferrous gluconate (FERGON) 324 MG tablet Take 1 tablet (324 mg total) by mouth daily with breakfast.  3  . fluticasone (CUTIVATE) 0.05 % cream Apply 1 application topically as needed.     [name] HYDROcodone-acetaminophen (NORCO) 7.5-325 MG per tablet Take 1 tablet by mouth 3 (three) times daily as needed for moderate pain.     [name] lisinopril (PRINIVIL,ZESTRIL) 40  MG tablet Take 40 mg by mouth daily.    . metoprolol (LOPRESSOR) 50 MG tablet Take 50 mg by mouth 2 (two) times daily.      [name] NITROSTAT 0.4 MG SL tablet PLACE 1 TAB UNDER TONGUE EVERY 5 MIN IF NEEDED FOR CHEST PAIN. MAY USE 3 TIMES.NO RELIEF CALL 911. 25 tablet 4  . potassium chloride SA (K-DUR,KLOR-CON) 20 MEQ tablet Take 20 mEq by mouth daily.      . simvastatin (ZOCOR) 20 MG tablet Take 20 mg by mouth at bedtime.      [name] terazosin (HYTRIN) 5 MG capsule TAKE 2 CAPSULES BY MOUTH ONCE DAILY. 60 capsule 6  . warfarin (COUMADIN) 2 MG tablet Take 1 tablet daily 30 tablet 1  . zolpidem (AMBIEN) 10 MG tablet Take 10 mg by mouth at bedtime.      No current facility-administered medications for this visit.     Past Surgical History  Procedure Laterality Date  . Coronary stent placement    . Subtotal colectomy  2012    secondary to ulcerative colitis and LGI bleeding  . Colonoscopy  08/2010    UNDER FLURO  . Colonoscopy  04/02/07    INCOMPLT  . Colonoscopy  12/04/06    INCOMP  . Colonoscopy  09/25/04    ROURK  . Upper gastrointestinal endoscopy  08/18/2010    NUR  . Cataract extraction w/phaco  02/26/2012    Procedure: CATARACT EXTRACTION PHACO AND INTRAOCULAR LENS PLACEMENT (IOC);  Surgeon: Elta Guadeloupe T. Gershon Crane, MD;  Location: [facility];  Service: Ophthalmology;  Laterality: Left;  CDE 8.94  . Cardiac catheterization    . Colonoscopy N/A 11/19/2014    Procedure: COLONOSCOPY;  Surgeon: Rogene Houston, MD;  Location: [facility];  Service: Endoscopy;  Laterality: N/A;  815  . Esophagogastroduodenoscopy N/A 11/19/2014    Procedure: ESOPHAGOGASTRODUODENOSCOPY (EGD);  Surgeon: Rogene Houston, MD;  Location: [facility];  Service: Endoscopy;  Laterality: N/A;     Allergies  Allergen Reactions  . Doxycycline     THROAT SWELLS      Family History  Problem Relation Age of Onset  . Healthy Sister   . Irritable bowel syndrome Daughter   . Anesthesia problems Neg Hx   . Malignant hyperthermia  Neg Hx   . Pseudochol deficiency Neg Hx      Social History Dennis Ray reports that he has never smoked. He has never used smokeless tobacco. Dennis Ray reports that he drinks alcohol.   Review of Systems CONSTITUTIONAL: No weight loss, fever, chills, weakness or fatigue.  HEENT: Eyes: No visual loss, blurred vision, double vision or yellow sclerae.No hearing loss, sneezing, congestion, runny nose or sore throat.  SKIN: No rash or itching.  CARDIOVASCULAR:  RESPIRATORY: No shortness of breath, cough or sputum.  GASTROINTESTINAL: No anorexia, nausea, vomiting or diarrhea. No abdominal pain or blood.  GENITOURINARY: No burning on urination, no polyuria NEUROLOGICAL: No headache, dizziness, syncope, paralysis, ataxia, numbness or tingling in the extremities. No change in bowel or bladder control.  MUSCULOSKELETAL: No muscle, back pain, joint pain or stiffness.  LYMPHATICS: No enlarged nodes. No history of splenectomy.  PSYCHIATRIC: No history of depression or anxiety.  ENDOCRINOLOGIC: No reports of sweating, cold or heat intolerance. No polyuria or polydipsia.  [name]   Physical Examination Filed Vitals:   06/28/15 1306  BP: 132/72  Pulse: 71   Filed Vitals:   06/28/15 1306  Height: 6\' 5"  (1.956 m)  Weight: 304 lb (137.893 kg)    Gen: resting comfortably, no acute distress HEENT: no scleral icterus, pupils equal round and reactive, no palptable cervical adenopathy,  CV: RRR, no m/r/g, no jvd Resp: Clear to auscultation bilaterally GI: abdomen is soft, non-tender, non-distended, normal bowel sounds, no hepatosplenomegaly MSK: extremities are warm, no edema.  Skin: warm, no rash Neuro:  no focal deficits Psych: appropriate affect   Diagnostic Studies 05/2012 Echo Study Conclusions  - Left ventricle: The cavity size was normal. Wall thickness was increased in a pattern of mild LVH with more prominent upper septal thickening. Systolic function was normal. The estimated  ejection fraction was in the range of 55% to 60%. - Aortic valve: Mildly to moderately calcified annulus. Mildly calcified leaflets. - Mitral valve: Calcified annulus. - Left atrium: The atrium was moderately dilated. - Right atrium: The atrium was mildly to moderately dilated. - Atrial septum: No defect or patent foramen ovale was identified. - Pulmonary arteries: PA peak pressure: 30mm Hg (S). Impressions:  - Compared to the prior study performed 08/08/10, there has been no significant interval change. Small area of hypokinesis at the base of the inferior wall is no longer apparent, but there was substantial mitral annular calcification in this region,  which might simulate a wall motion abnormality.  Jan 2001 Cath CORONARY ARTERIOGRAPHY: Left main coronary artery: The left main coronary artery was normal.  Left anterior descending artery: The left anterior descending artery was normal.  Circumflex coronary artery: The circumflex coronary artery had a 30% lesion in the distal obtuse marginal Atalia Litzinger.  Right coronary artery: The right coronary artery had 20% multiple discrete lesions proximally. There were 40% multiple discrete lesions in the mid vessel. The distal vessel at the bend had a 95% ruptured plaque with a minimal amount of clot left. There was normal TIMI-3 flow down the vessel. There was a large PDA and two posterolateral branches without disease.  RIGHT ANTERIOR OBLIQUE VENTRICULOGRAPHY: The RAO ventriculography revealed inferoapical hypokinesis with an EF of 50%. There was no MR. No gradient across the aortic valve. Aortic pressure was 158/87. LV pressure was in the range of  158/24 pre A wave.  IMPRESSION: The patient has had successful lytic therapy with fairly well preserved left ventricular function. He has a tight residual lesion in the distal right coronary artery. He will be referred for stenting of the  distal right coronary artery by Dr. Lia Foyer.  07/30/14 Clinic EKG reviewed Afib, rate 60, inferior Q waves.    Assessment and Plan  1. CAD - no current symptoms, continue risk factor modification  2. Afib - no current symptoms, continue current meds. - discussed NOACs, he is not interested.   3. HTN - at goal,continue current meds  4. Hyperlipidemia -request most recent lipid panel from pcp - in setting of CAD will change to high dose statin, start atorvastatin 80mg  daily.       Arnoldo Lenis, M.D.

## 2015-06-28 NOTE — Progress Notes (Signed)
Subjective:    Patient ID: Dennis Ray, male    DOB: Nov 22, 1940, 74 y.o.   MRN: [id number]  HPI  Here today for f/u of UC. Has not been taking Delzicol 400 mg twice a day due to cost. Appetite is good. No weight loss. Denies any abdominal pain. Usually has a BM every other day. Takes a stool softener.  No melena or BRRB. Last colonoscopy in March of this year.  06/24/2015 H and H 13.9 AND 45.0, mcv 86. Glucose 105, BUN 14, Creatinine 0.88    11/19/2014 EGD & Colonoscopy  Indications: Patient is 74 year old Caucasian male was chronic ulcerative colitis and underwent extended right hemicolectomy and generally 2012 for recurrent GI bleed and nonhealing ulcers in proximal colon. He has remained in remission. Last month he was noted to have drop in his hemoglobin by 2.3 g. Hemoccults were checked and all 3 were positive and iron studies show iron deficiency anemia. He denies melena or rectal bleeding. He is undergoing diagnostic EGD and colonoscopy.  Impression:  EGD findings; Few small hyperplastic polyps gastric body otherwise normal EGD. No mucosal abnormality noted involving duodenal mucosa to suggest celiac disease.  Colonoscopy findings; Normal mucosa of distal small bowel. Mild inflammatory changes at ileocolonic anastomosis without stricture. 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  Review of Systems Past Medical History  Diagnosis Date  . Hypertension   . Coronary artery disease     IMI in 09/1999->RCA stent, nl LAD, 30%, distal OM, RCA  95% distal lesion; nuclear in 07/2010-moderate-sized Inferior infarction; normal EF by echo in 07/2010  . Atrial flutter     Previously not any Coumadin candidate as the result of chronic GI blood loss  . Hyperlipidemia   . Alcohol abuse, in remission   . Ulcerative colitis     s/p subtotal colectomy  .  GERD (gastroesophageal reflux disease)   . Myocardial infarction   . Sleep apnea     STOP BANG score of 4    Past Surgical History  Procedure Laterality Date  . Coronary stent placement    . Subtotal colectomy  2012    secondary to ulcerative colitis and LGI bleeding  . Colonoscopy  08/2010    UNDER FLURO  . Colonoscopy  04/02/07    INCOMPLT  . Colonoscopy  12/04/06    INCOMP  . Colonoscopy  09/25/04    ROURK  . Upper gastrointestinal endoscopy  08/18/2010    NUR  . Cataract extraction w/phaco  02/26/2012    Procedure: CATARACT EXTRACTION PHACO AND INTRAOCULAR LENS PLACEMENT (IOC);  Surgeon: Elta Guadeloupe T. Gershon Crane, MD;  Location: [facility];  Service: Ophthalmology;  Laterality: Left;  CDE 8.94  . Cardiac catheterization    . Colonoscopy N/A 11/19/2014    Procedure: COLONOSCOPY;  Surgeon: Rogene Houston, MD;  Location: [facility];  Service: Endoscopy;  Laterality: N/A;  815  . Esophagogastroduodenoscopy N/A 11/19/2014    Procedure: ESOPHAGOGASTRODUODENOSCOPY (EGD);  Surgeon: Rogene Houston, MD;  Location: [facility];  Service: Endoscopy;  Laterality: N/A;    Allergies  Allergen Reactions  . Doxycycline     THROAT SWELLS    Current Outpatient Prescriptions on File Prior to Visit  Medication Sig Dispense Refill  . amLODipine (NORVASC) 2.5 MG tablet Take 5 mg by mouth daily.     . cloNIDine (CATAPRES) 0.2 MG tablet Take 0.2 mg by mouth 2 (two) times daily.    . DELZICOL 400 MG CPDR DR capsule TAKE  2 CAPSULES BY MOUTH TWICE DAILY. 120 capsule 6  . ferrous gluconate (FERGON) 324 MG tablet Take 1 tablet (324 mg total) by mouth daily with breakfast.  3  . fluticasone (CUTIVATE) 0.05 % cream Apply 1 application topically as needed.     [name] HYDROcodone-acetaminophen (NORCO) 7.5-325 MG per tablet Take 1 tablet by mouth 3 (three) times daily as needed for moderate pain.     [name] lisinopril (PRINIVIL,ZESTRIL) 40 MG tablet Take 40 mg by mouth daily.    . metoprolol (LOPRESSOR) 50 MG tablet Take 50  mg by mouth 2 (two) times daily.      [name] NITROSTAT 0.4 MG SL tablet PLACE 1 TAB UNDER TONGUE EVERY 5 MIN IF NEEDED FOR CHEST PAIN. MAY USE 3 TIMES.NO RELIEF CALL 911. 25 tablet 4  . potassium chloride SA (K-DUR,KLOR-CON) 20 MEQ tablet Take 20 mEq by mouth daily.      . simvastatin (ZOCOR) 20 MG tablet Take 20 mg by mouth at bedtime.      [name] terazosin (HYTRIN) 5 MG capsule TAKE 2 CAPSULES BY MOUTH ONCE DAILY. 60 capsule 6  . warfarin (COUMADIN) 2 MG tablet Take 1 tablet daily 30 tablet 1  . zolpidem (AMBIEN) 10 MG tablet Take 10 mg by mouth at bedtime.      No current facility-administered medications on file prior to visit.        Objective:   Physical Exam  Blood pressure 140/76, pulse 80, temperature 97.4 F (36.3 C), height 6\' 3"  (1.905 m), weight 301 lb 4.8 oz (136.669 kg).  Alert and oriented. Skin warm and dry. Oral mucosa is moist.   . Sclera anicteric, conjunctivae is pink. Thyroid not enlarged. No cervical lymphadenopathy. Lungs clear. Heart regular rate and rhythm.  Abdomen is soft. Bowel sounds are positive. No hepatomegaly. No abdominal masses felt. No tenderness.  No edema to lower extremities.         Assessment & Plan:  UC. He seems to be doing well. Samples given of Delzicol x 6 boxes. OV in 1 year.

## 2015-06-28 NOTE — Patient Instructions (Signed)
Medication Instructions:  Your physician has recommended you make the following change in your medication:  1) STOP Simvastatin 2) START Atorvastatin 80 mg daily  Labwork: None ordered  We will obtain recent lab work from your primary care provider.  Testing/Procedures: None ordered  Follow-Up: Your physician wants you to follow-up in: 1 year with Dr. Harl Bowie.  You will receive a reminder letter in the mail two months in advance. If you don't receive a letter, please call our office to schedule the follow-up appointment.  Any Other Special Instructions Will Be Listed Below (If Applicable). Thank you for choosing [facility]!!

## 2015-07-01 ENCOUNTER — Encounter (INDEPENDENT_AMBULATORY_CARE_PROVIDER_SITE_OTHER): Payer: Self-pay

## 2015-07-06 ENCOUNTER — Other Ambulatory Visit: Payer: Self-pay | Admitting: Cardiology

## 2015-07-11 DIAGNOSIS — Z23 Encounter for immunization: Secondary | ICD-10-CM | POA: Diagnosis not present

## 2015-07-11 DIAGNOSIS — H05229 Edema of unspecified orbit: Secondary | ICD-10-CM | POA: Diagnosis not present

## 2015-07-11 DIAGNOSIS — Z6838 Body mass index (BMI) 38.0-38.9, adult: Secondary | ICD-10-CM | POA: Diagnosis not present

## 2015-07-11 DIAGNOSIS — I10 Essential (primary) hypertension: Secondary | ICD-10-CM | POA: Diagnosis not present

## 2015-07-11 DIAGNOSIS — M545 Low back pain: Secondary | ICD-10-CM | POA: Diagnosis not present

## 2015-07-18 ENCOUNTER — Ambulatory Visit (INDEPENDENT_AMBULATORY_CARE_PROVIDER_SITE_OTHER): Payer: Medicare Other | Admitting: *Deleted

## 2015-07-18 DIAGNOSIS — Z7901 Long term (current) use of anticoagulants: Secondary | ICD-10-CM | POA: Diagnosis not present

## 2015-07-18 DIAGNOSIS — I4891 Unspecified atrial fibrillation: Secondary | ICD-10-CM

## 2015-07-18 DIAGNOSIS — Z5181 Encounter for therapeutic drug level monitoring: Secondary | ICD-10-CM | POA: Diagnosis not present

## 2015-07-18 LAB — POCT INR: INR: 1.6

## 2015-07-27 DIAGNOSIS — M9905 Segmental and somatic dysfunction of pelvic region: Secondary | ICD-10-CM | POA: Diagnosis not present

## 2015-07-27 DIAGNOSIS — M545 Low back pain: Secondary | ICD-10-CM | POA: Diagnosis not present

## 2015-07-27 DIAGNOSIS — M9902 Segmental and somatic dysfunction of thoracic region: Secondary | ICD-10-CM | POA: Diagnosis not present

## 2015-07-27 DIAGNOSIS — M9903 Segmental and somatic dysfunction of lumbar region: Secondary | ICD-10-CM | POA: Diagnosis not present

## 2015-07-29 DIAGNOSIS — M9905 Segmental and somatic dysfunction of pelvic region: Secondary | ICD-10-CM | POA: Diagnosis not present

## 2015-07-29 DIAGNOSIS — M9902 Segmental and somatic dysfunction of thoracic region: Secondary | ICD-10-CM | POA: Diagnosis not present

## 2015-07-29 DIAGNOSIS — M9903 Segmental and somatic dysfunction of lumbar region: Secondary | ICD-10-CM | POA: Diagnosis not present

## 2015-07-29 DIAGNOSIS — M545 Low back pain: Secondary | ICD-10-CM | POA: Diagnosis not present

## 2015-08-01 DIAGNOSIS — M9903 Segmental and somatic dysfunction of lumbar region: Secondary | ICD-10-CM | POA: Diagnosis not present

## 2015-08-01 DIAGNOSIS — M9902 Segmental and somatic dysfunction of thoracic region: Secondary | ICD-10-CM | POA: Diagnosis not present

## 2015-08-01 DIAGNOSIS — M545 Low back pain: Secondary | ICD-10-CM | POA: Diagnosis not present

## 2015-08-01 DIAGNOSIS — M9905 Segmental and somatic dysfunction of pelvic region: Secondary | ICD-10-CM | POA: Diagnosis not present

## 2015-08-03 ENCOUNTER — Ambulatory Visit (INDEPENDENT_AMBULATORY_CARE_PROVIDER_SITE_OTHER): Payer: Medicare Other | Admitting: *Deleted

## 2015-08-03 DIAGNOSIS — Z7901 Long term (current) use of anticoagulants: Secondary | ICD-10-CM | POA: Diagnosis not present

## 2015-08-03 DIAGNOSIS — I4891 Unspecified atrial fibrillation: Secondary | ICD-10-CM | POA: Diagnosis not present

## 2015-08-03 DIAGNOSIS — Z5181 Encounter for therapeutic drug level monitoring: Secondary | ICD-10-CM

## 2015-08-03 LAB — POCT INR: INR: 1.8

## 2015-08-03 MED ORDER — WARFARIN SODIUM 2 MG PO TABS: ORAL_TABLET | ORAL | Status: DC

## 2015-08-08 DIAGNOSIS — M9903 Segmental and somatic dysfunction of lumbar region: Secondary | ICD-10-CM | POA: Diagnosis not present

## 2015-08-08 DIAGNOSIS — M9902 Segmental and somatic dysfunction of thoracic region: Secondary | ICD-10-CM | POA: Diagnosis not present

## 2015-08-08 DIAGNOSIS — M545 Low back pain: Secondary | ICD-10-CM | POA: Diagnosis not present

## 2015-08-08 DIAGNOSIS — M9905 Segmental and somatic dysfunction of pelvic region: Secondary | ICD-10-CM | POA: Diagnosis not present

## 2015-08-24 ENCOUNTER — Ambulatory Visit (INDEPENDENT_AMBULATORY_CARE_PROVIDER_SITE_OTHER): Payer: Medicare Other | Admitting: *Deleted

## 2015-08-24 DIAGNOSIS — I4891 Unspecified atrial fibrillation: Secondary | ICD-10-CM | POA: Diagnosis not present

## 2015-08-24 DIAGNOSIS — Z7901 Long term (current) use of anticoagulants: Secondary | ICD-10-CM | POA: Diagnosis not present

## 2015-08-24 DIAGNOSIS — Z5181 Encounter for therapeutic drug level monitoring: Secondary | ICD-10-CM | POA: Diagnosis not present

## 2015-08-24 DIAGNOSIS — I480 Paroxysmal atrial fibrillation: Secondary | ICD-10-CM | POA: Diagnosis not present

## 2015-08-24 LAB — POCT INR: INR: 1.7

## 2015-09-14 ENCOUNTER — Ambulatory Visit (INDEPENDENT_AMBULATORY_CARE_PROVIDER_SITE_OTHER): Payer: Medicare Other | Admitting: *Deleted

## 2015-09-14 DIAGNOSIS — Z7901 Long term (current) use of anticoagulants: Secondary | ICD-10-CM

## 2015-09-14 DIAGNOSIS — Z5181 Encounter for therapeutic drug level monitoring: Secondary | ICD-10-CM | POA: Diagnosis not present

## 2015-09-14 DIAGNOSIS — I4891 Unspecified atrial fibrillation: Secondary | ICD-10-CM

## 2015-09-14 LAB — POCT INR: INR: 2.8

## 2015-10-10 ENCOUNTER — Ambulatory Visit (INDEPENDENT_AMBULATORY_CARE_PROVIDER_SITE_OTHER): Payer: Medicare Other | Admitting: *Deleted

## 2015-10-10 DIAGNOSIS — I4891 Unspecified atrial fibrillation: Secondary | ICD-10-CM

## 2015-10-10 DIAGNOSIS — Z5181 Encounter for therapeutic drug level monitoring: Secondary | ICD-10-CM | POA: Diagnosis not present

## 2015-10-10 LAB — POCT INR: INR: 1.7

## 2015-10-11 DIAGNOSIS — I2510 Atherosclerotic heart disease of native coronary artery without angina pectoris: Secondary | ICD-10-CM | POA: Diagnosis not present

## 2015-10-11 DIAGNOSIS — Z79899 Other long term (current) drug therapy: Secondary | ICD-10-CM | POA: Diagnosis not present

## 2015-10-11 DIAGNOSIS — E876 Hypokalemia: Secondary | ICD-10-CM | POA: Diagnosis not present

## 2015-10-17 DIAGNOSIS — I10 Essential (primary) hypertension: Secondary | ICD-10-CM | POA: Diagnosis not present

## 2015-10-17 DIAGNOSIS — Z6838 Body mass index (BMI) 38.0-38.9, adult: Secondary | ICD-10-CM | POA: Diagnosis not present

## 2015-10-17 DIAGNOSIS — Z23 Encounter for immunization: Secondary | ICD-10-CM | POA: Diagnosis not present

## 2015-10-17 DIAGNOSIS — G8929 Other chronic pain: Secondary | ICD-10-CM | POA: Diagnosis not present

## 2015-10-24 ENCOUNTER — Ambulatory Visit (INDEPENDENT_AMBULATORY_CARE_PROVIDER_SITE_OTHER): Payer: Medicare Other | Admitting: *Deleted

## 2015-10-24 DIAGNOSIS — I4891 Unspecified atrial fibrillation: Secondary | ICD-10-CM

## 2015-10-24 DIAGNOSIS — Z5181 Encounter for therapeutic drug level monitoring: Secondary | ICD-10-CM

## 2015-10-24 LAB — POCT INR: INR: 2.1

## 2015-11-15 ENCOUNTER — Encounter (INDEPENDENT_AMBULATORY_CARE_PROVIDER_SITE_OTHER): Payer: Self-pay

## 2015-11-21 ENCOUNTER — Ambulatory Visit (INDEPENDENT_AMBULATORY_CARE_PROVIDER_SITE_OTHER): Payer: Medicare Other | Admitting: *Deleted

## 2015-11-21 DIAGNOSIS — I4891 Unspecified atrial fibrillation: Secondary | ICD-10-CM | POA: Diagnosis not present

## 2015-11-21 DIAGNOSIS — Z5181 Encounter for therapeutic drug level monitoring: Secondary | ICD-10-CM | POA: Diagnosis not present

## 2015-11-21 LAB — POCT INR: INR: 2.6

## 2015-12-20 ENCOUNTER — Telehealth (INDEPENDENT_AMBULATORY_CARE_PROVIDER_SITE_OTHER): Payer: Self-pay | Admitting: *Deleted

## 2015-12-20 NOTE — Telephone Encounter (Signed)
Patient called, confused that his prescription wasn't at [facility] - wanted to know if he misunderstood, was he supposed to pick it up at the office.  Wanted clarification.  We do have it up front for him to pick up.  I called the patient and informed him that we do have it for him and he is going to pick up before [place]

## 2015-12-20 NOTE — Telephone Encounter (Signed)
Rx to front desk

## 2015-12-20 NOTE — Telephone Encounter (Signed)
Patient wants written RX for Delzicol, he is changing his pharmacy and wants to take it to the pharmacy his self

## 2015-12-21 ENCOUNTER — Ambulatory Visit (INDEPENDENT_AMBULATORY_CARE_PROVIDER_SITE_OTHER): Payer: Medicare Other | Admitting: *Deleted

## 2015-12-21 DIAGNOSIS — Z5181 Encounter for therapeutic drug level monitoring: Secondary | ICD-10-CM

## 2015-12-21 DIAGNOSIS — I4891 Unspecified atrial fibrillation: Secondary | ICD-10-CM

## 2015-12-21 LAB — POCT INR: INR: 1.9

## 2016-01-10 DIAGNOSIS — I2510 Atherosclerotic heart disease of native coronary artery without angina pectoris: Secondary | ICD-10-CM | POA: Diagnosis not present

## 2016-01-10 DIAGNOSIS — R600 Localized edema: Secondary | ICD-10-CM | POA: Diagnosis not present

## 2016-01-10 DIAGNOSIS — I10 Essential (primary) hypertension: Secondary | ICD-10-CM | POA: Diagnosis not present

## 2016-01-10 DIAGNOSIS — Z79899 Other long term (current) drug therapy: Secondary | ICD-10-CM | POA: Diagnosis not present

## 2016-01-17 ENCOUNTER — Other Ambulatory Visit: Payer: Self-pay | Admitting: Cardiology

## 2016-01-23 ENCOUNTER — Ambulatory Visit (INDEPENDENT_AMBULATORY_CARE_PROVIDER_SITE_OTHER): Payer: Medicare Other | Admitting: *Deleted

## 2016-01-23 DIAGNOSIS — I4891 Unspecified atrial fibrillation: Secondary | ICD-10-CM | POA: Diagnosis not present

## 2016-01-23 DIAGNOSIS — Z5181 Encounter for therapeutic drug level monitoring: Secondary | ICD-10-CM

## 2016-01-23 LAB — POCT INR: INR: 3.7

## 2016-01-30 DIAGNOSIS — M9905 Segmental and somatic dysfunction of pelvic region: Secondary | ICD-10-CM | POA: Diagnosis not present

## 2016-01-30 DIAGNOSIS — M9902 Segmental and somatic dysfunction of thoracic region: Secondary | ICD-10-CM | POA: Diagnosis not present

## 2016-01-30 DIAGNOSIS — M9903 Segmental and somatic dysfunction of lumbar region: Secondary | ICD-10-CM | POA: Diagnosis not present

## 2016-01-30 DIAGNOSIS — M545 Low back pain: Secondary | ICD-10-CM | POA: Diagnosis not present

## 2016-02-03 DIAGNOSIS — M9905 Segmental and somatic dysfunction of pelvic region: Secondary | ICD-10-CM | POA: Diagnosis not present

## 2016-02-03 DIAGNOSIS — M545 Low back pain: Secondary | ICD-10-CM | POA: Diagnosis not present

## 2016-02-03 DIAGNOSIS — M9902 Segmental and somatic dysfunction of thoracic region: Secondary | ICD-10-CM | POA: Diagnosis not present

## 2016-02-03 DIAGNOSIS — M9903 Segmental and somatic dysfunction of lumbar region: Secondary | ICD-10-CM | POA: Diagnosis not present

## 2016-02-08 ENCOUNTER — Encounter: Payer: Self-pay | Admitting: *Deleted

## 2016-02-10 DIAGNOSIS — M9905 Segmental and somatic dysfunction of pelvic region: Secondary | ICD-10-CM | POA: Diagnosis not present

## 2016-02-10 DIAGNOSIS — M9903 Segmental and somatic dysfunction of lumbar region: Secondary | ICD-10-CM | POA: Diagnosis not present

## 2016-02-10 DIAGNOSIS — M545 Low back pain: Secondary | ICD-10-CM | POA: Diagnosis not present

## 2016-02-10 DIAGNOSIS — M9902 Segmental and somatic dysfunction of thoracic region: Secondary | ICD-10-CM | POA: Diagnosis not present

## 2016-02-15 ENCOUNTER — Ambulatory Visit (INDEPENDENT_AMBULATORY_CARE_PROVIDER_SITE_OTHER): Payer: Medicare Other | Admitting: *Deleted

## 2016-02-15 DIAGNOSIS — I4891 Unspecified atrial fibrillation: Secondary | ICD-10-CM | POA: Diagnosis not present

## 2016-02-15 DIAGNOSIS — Z5181 Encounter for therapeutic drug level monitoring: Secondary | ICD-10-CM | POA: Diagnosis not present

## 2016-02-15 LAB — POCT INR: INR: 1.7

## 2016-02-29 ENCOUNTER — Ambulatory Visit (INDEPENDENT_AMBULATORY_CARE_PROVIDER_SITE_OTHER): Payer: Medicare Other | Admitting: *Deleted

## 2016-02-29 DIAGNOSIS — I4891 Unspecified atrial fibrillation: Secondary | ICD-10-CM

## 2016-02-29 DIAGNOSIS — Z5181 Encounter for therapeutic drug level monitoring: Secondary | ICD-10-CM | POA: Diagnosis not present

## 2016-02-29 LAB — POCT INR: INR: 2.2

## 2016-03-12 DIAGNOSIS — M9905 Segmental and somatic dysfunction of pelvic region: Secondary | ICD-10-CM | POA: Diagnosis not present

## 2016-03-12 DIAGNOSIS — M9902 Segmental and somatic dysfunction of thoracic region: Secondary | ICD-10-CM | POA: Diagnosis not present

## 2016-03-12 DIAGNOSIS — M545 Low back pain: Secondary | ICD-10-CM | POA: Diagnosis not present

## 2016-03-12 DIAGNOSIS — M9903 Segmental and somatic dysfunction of lumbar region: Secondary | ICD-10-CM | POA: Diagnosis not present

## 2016-03-19 IMAGING — DX DG CHEST 2V
3 series · 3 of 3 positions shown · non-contrast
Comparison: 01/11/2012

CLINICAL DATA: Right-sided abdominal and chest pain

EXAM:
CHEST  2 VIEW

[chest pa]
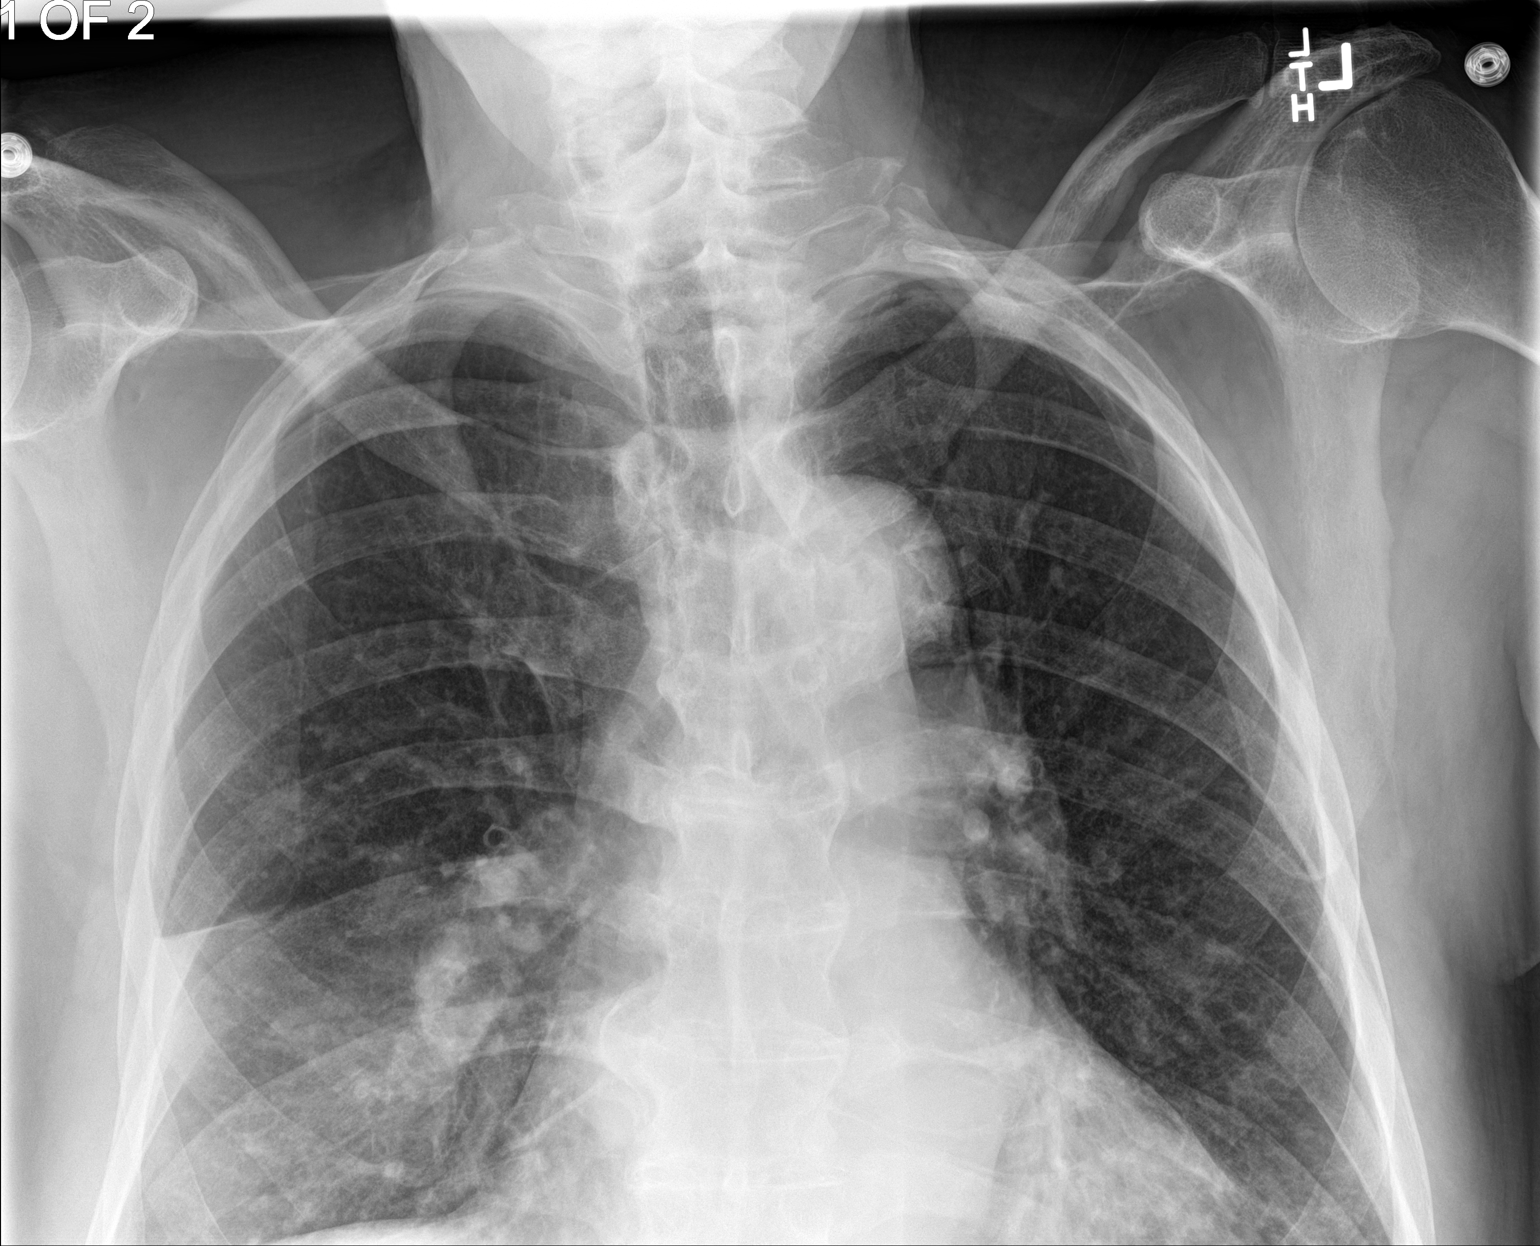

[chest lat]
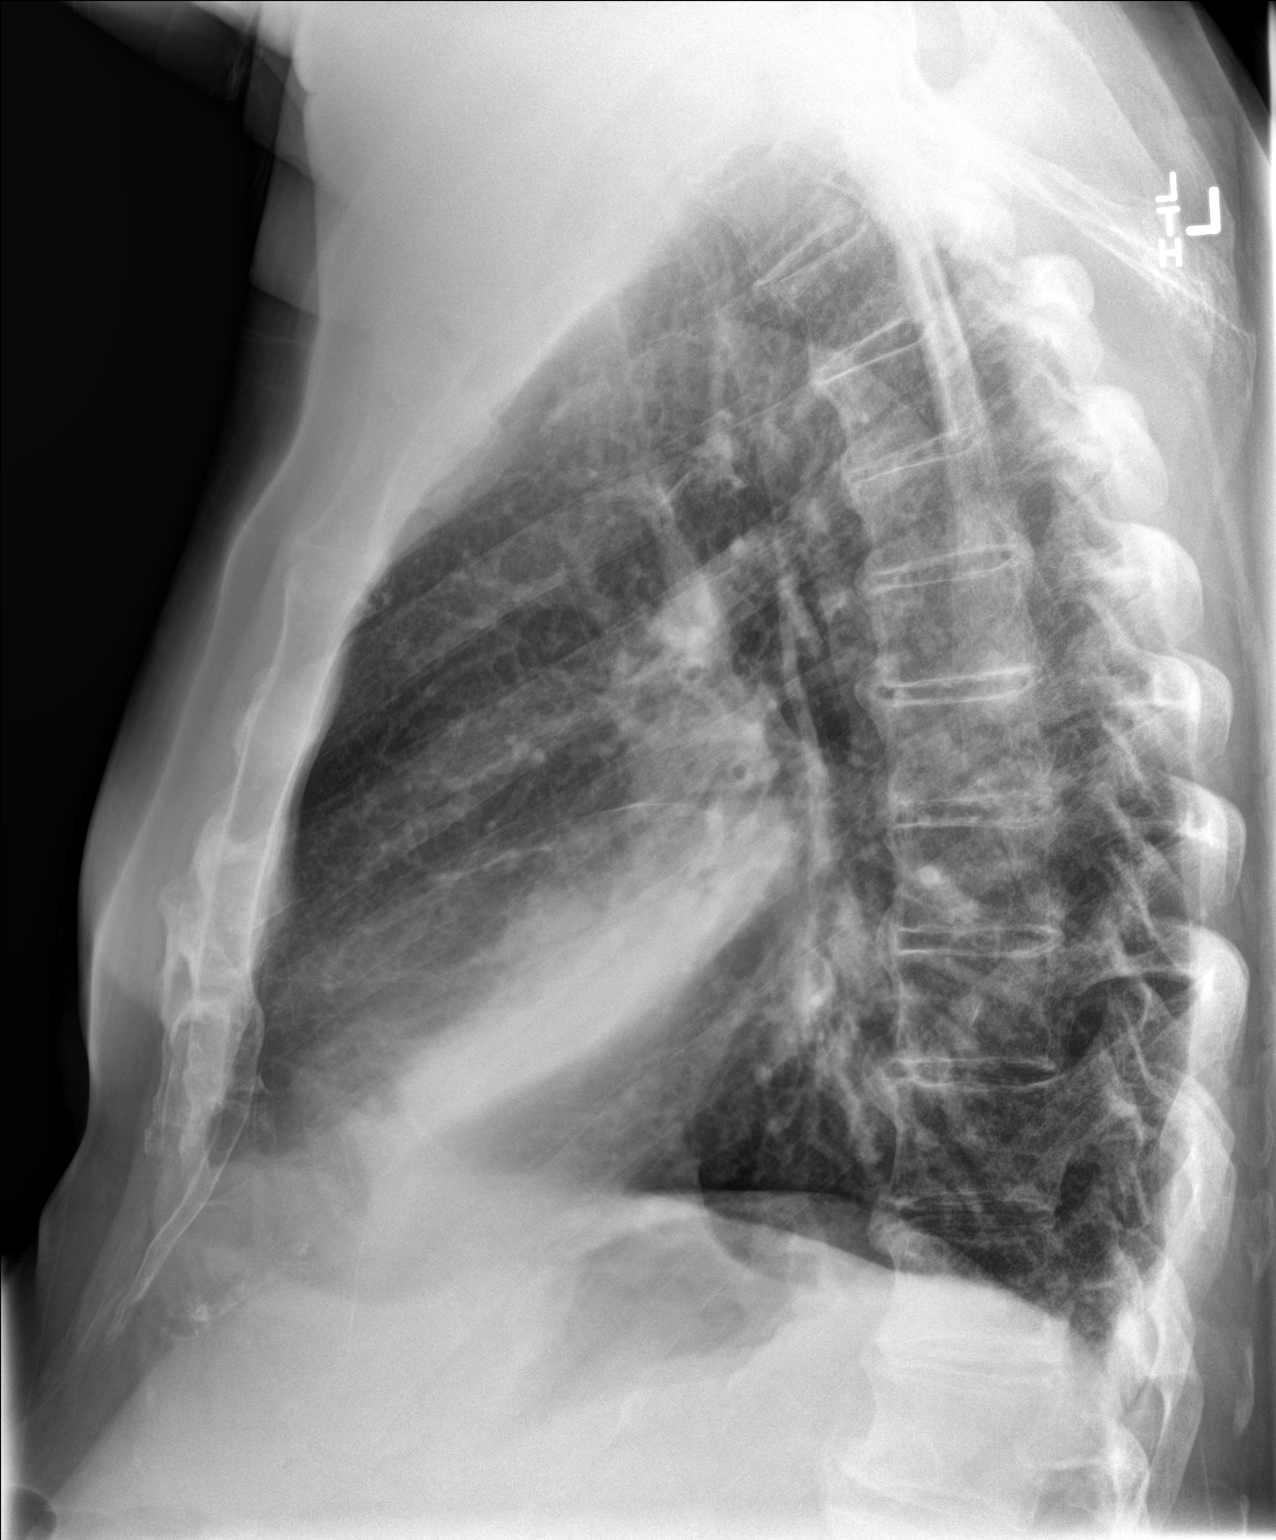

[chest ap]
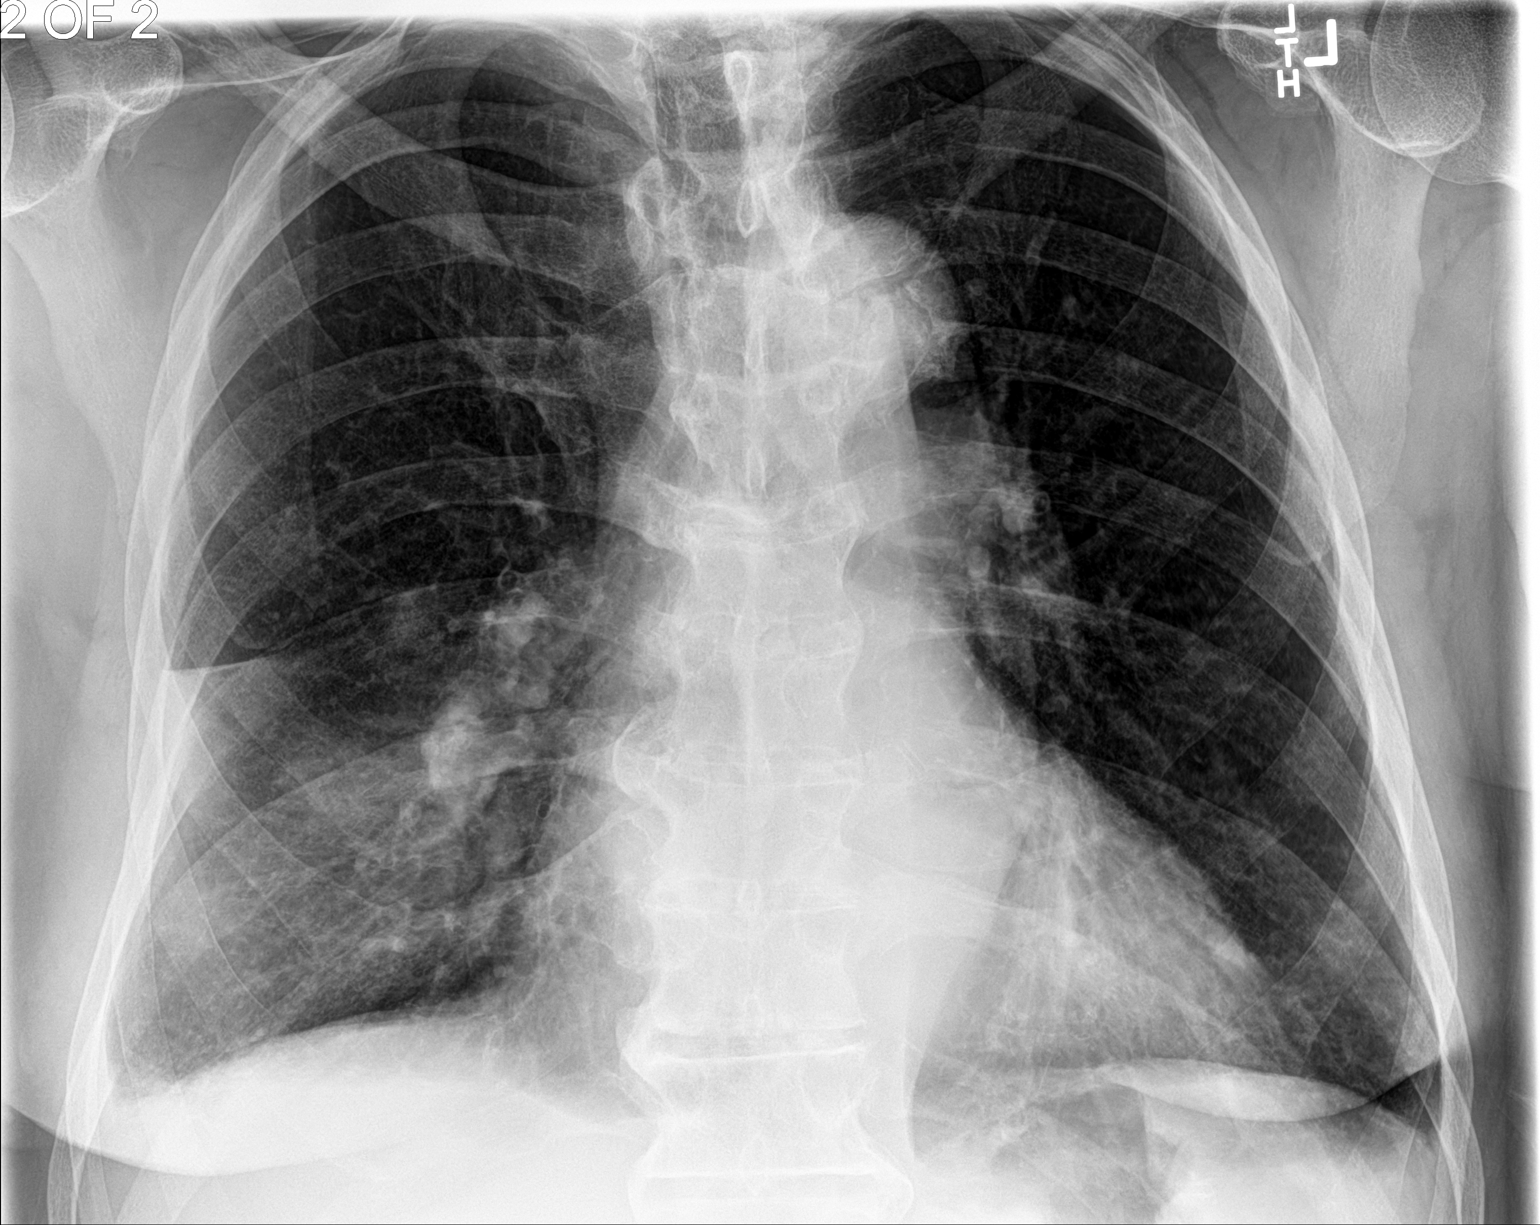

[3 of 3 positions shown; findings below may reference images not displayed]

FINDINGS: Cardiac shadow is within normal limits. The lungs are well aerated
bilaterally right middle lobe infiltrate is identified with some
volume loss seen. The left lung is clear. The bony structures are
within normal limits for the patient's age.
IMPRESSION: Right middle lobe pneumonia. Followup films following appropriate
therapy are recommended.

## 2016-03-22 DIAGNOSIS — Z79891 Long term (current) use of opiate analgesic: Secondary | ICD-10-CM | POA: Diagnosis not present

## 2016-03-24 IMAGING — CT CT HEAD W/O CM
1 series · 16 of 30 positions shown, 20 images · non-contrast
Comparison: None.

CLINICAL DATA: Fall at desk. Hit posterior right head on floor.
Possible loss of consciousness. History of hypertension. Myocardial
infarction.

EXAM:
CT HEAD WITHOUT CONTRAST
TECHNIQUE: Contiguous axial images were obtained from the base of the skull
through the vertex without intravenous contrast.

[Series 2: headtrauma 4.8 h37s · axial · 0.46mm/px · z∈[+101,+259]mm · 16 of 36 slices shown, 20 images]
[im 2/36  brain]
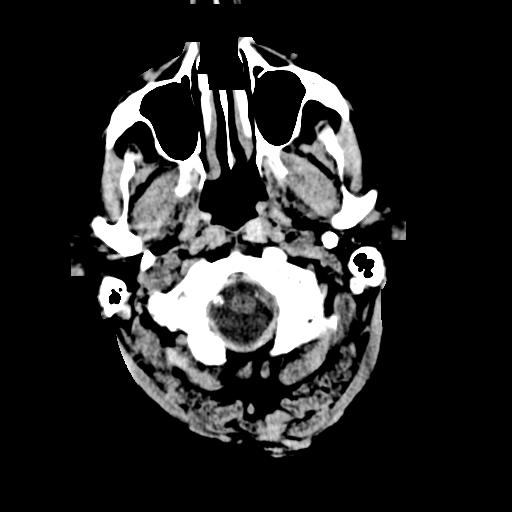
[im 2/36  bone]
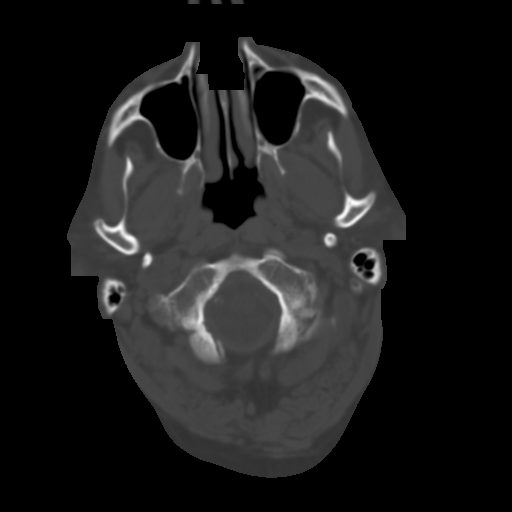
[im 4/36  brain]
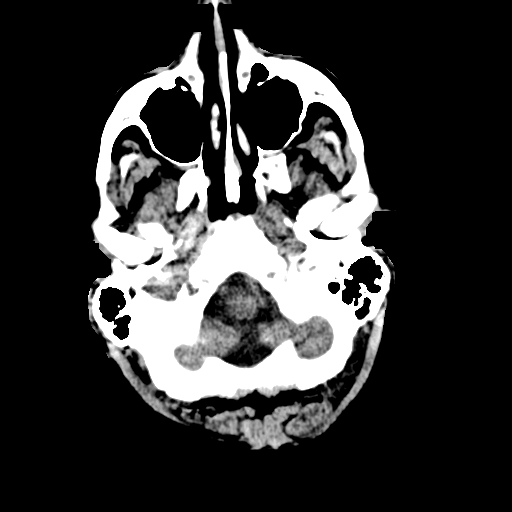
[im 7/36  brain]
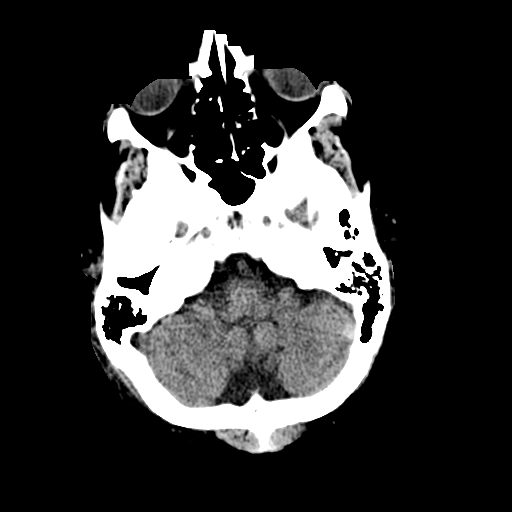
[im 9/36  brain]
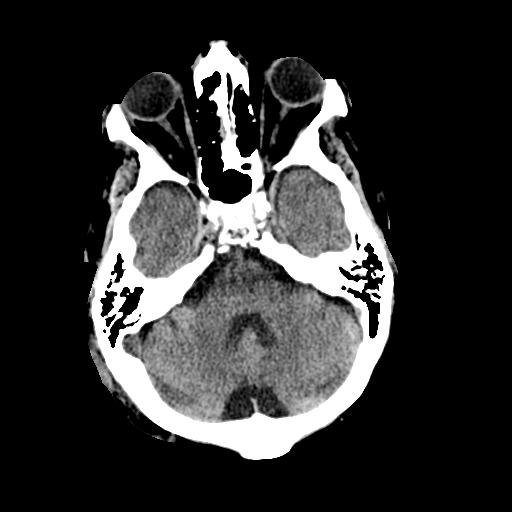
[im 10/36  brain]
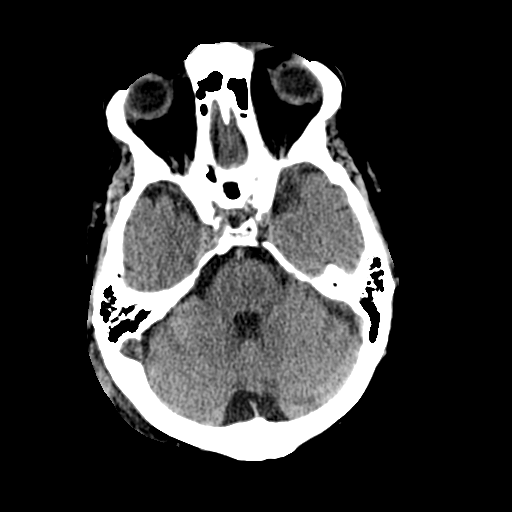
[im 10/36  bone]
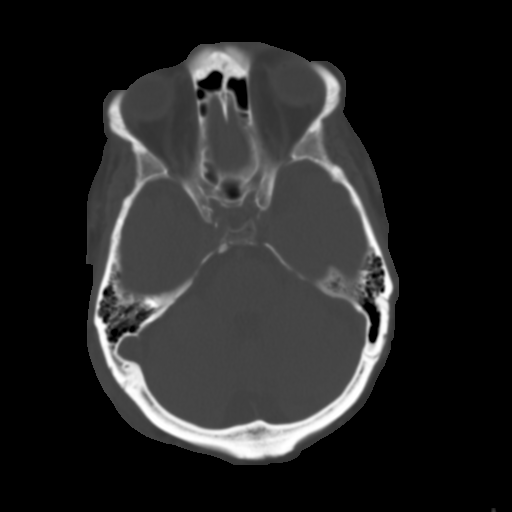
[im 13/36  brain]
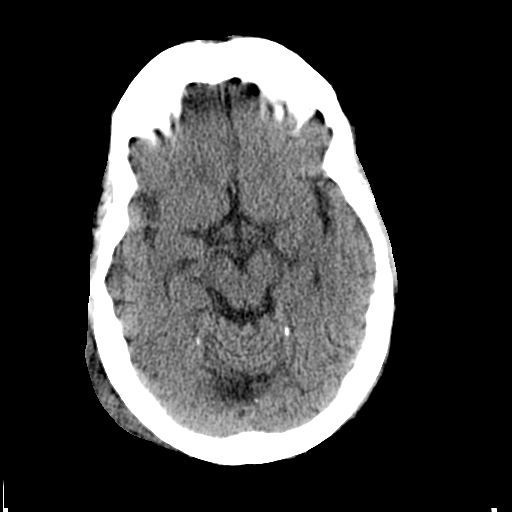
[im 15/36  brain]
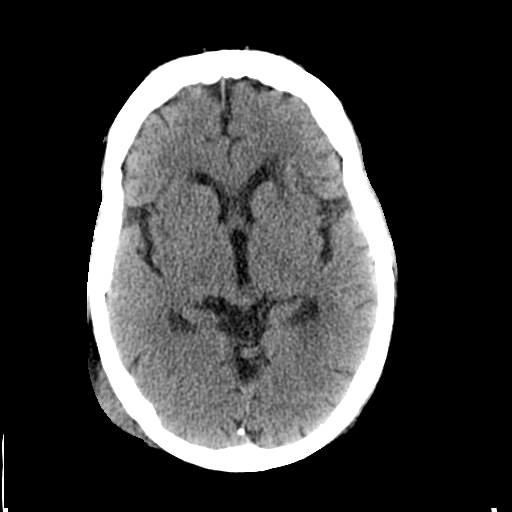
[im 17/36  brain]
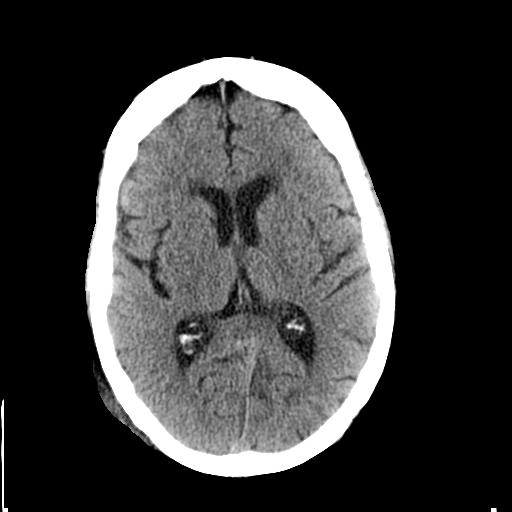
[im 19/36  brain]
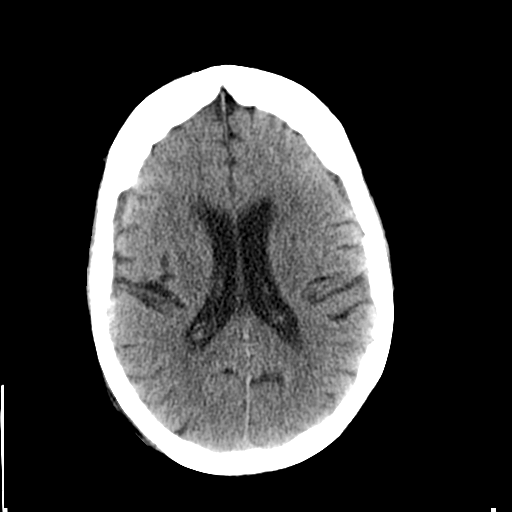
[im 19/36  bone]
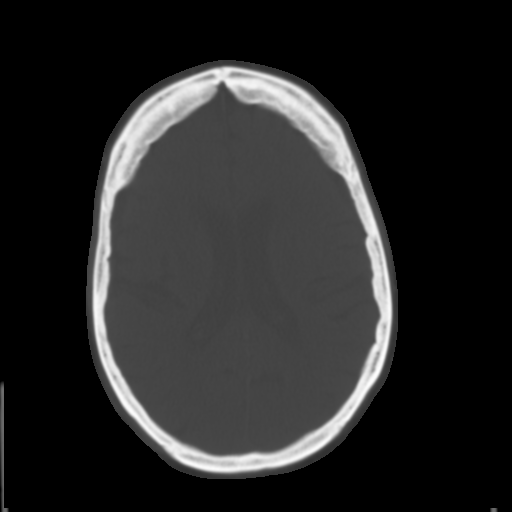
[im 21/36  brain]
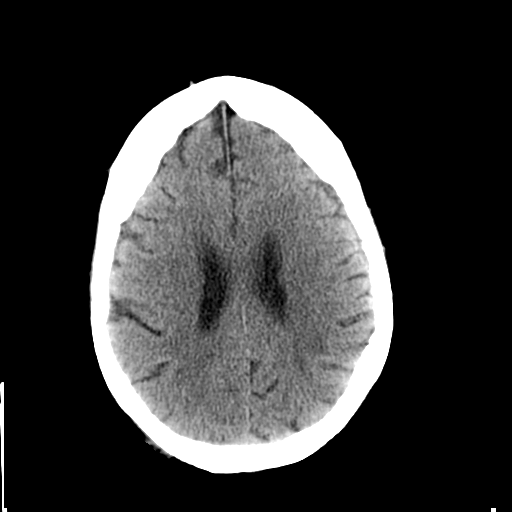
[im 23/36  brain]
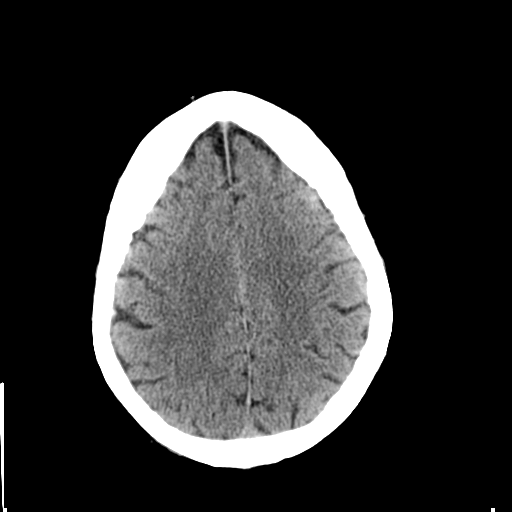
[im 26/36  brain]
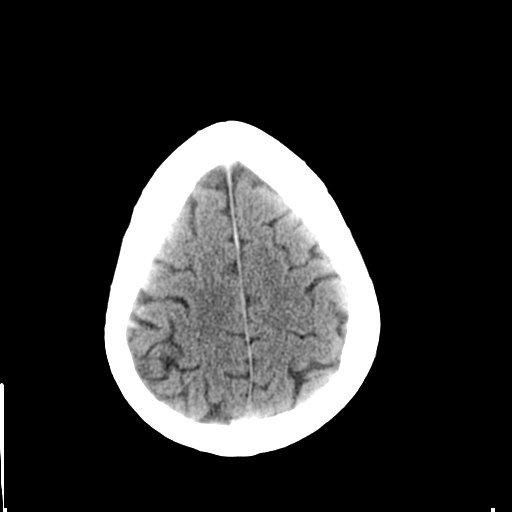
[im 27/36  brain]
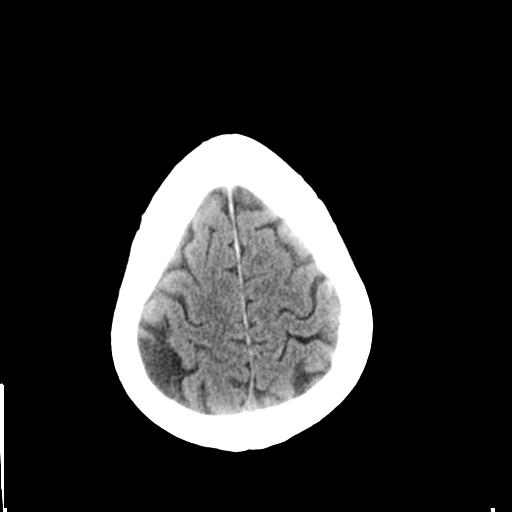
[im 27/36  bone]
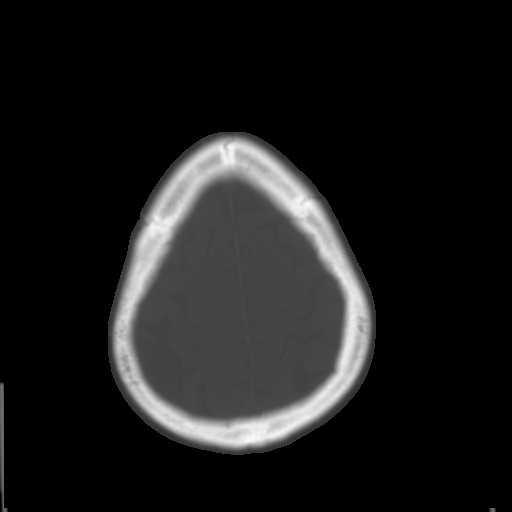
[im 29/36  brain]
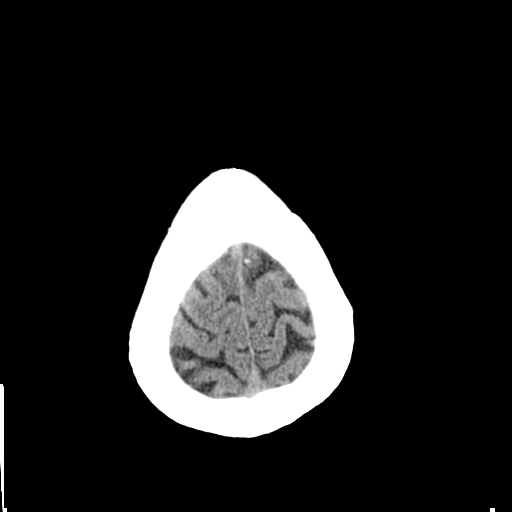
[im 32/36  brain]
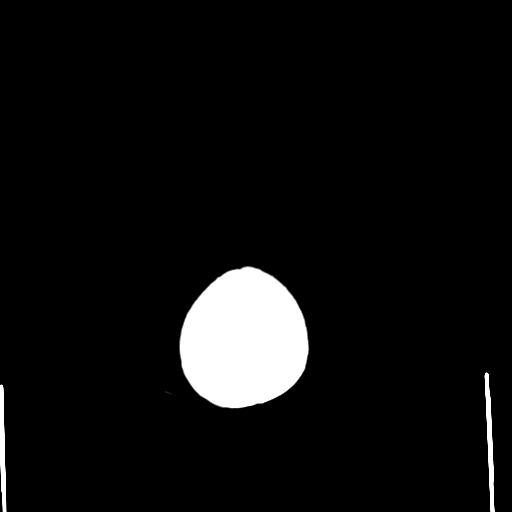
[im 34/36  brain]
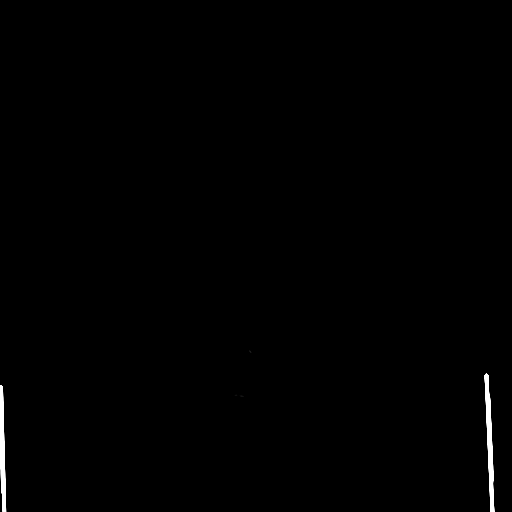

[16 of 30 positions shown; findings below may reference images not displayed]

FINDINGS: Sinuses/Soft tissues: Moderate right parietal scalp soft tissue
swelling, including on image 30 of series 3. Hypoplastic frontal
sinuses. Other paranasal sinuses and mastoid air cells are clear.
Mild hyperostosis frontalis interna. No skull fracture.

Intracranial: Mild low density in the periventricular white matter
likely related to small vessel disease. No mass lesion, hemorrhage,
hydrocephalus, acute infarct, intra-axial, or extra-axial fluid
collection. Bilateral carotid atherosclerosis. Right vertebral
artery atherosclerosis.
IMPRESSION: 1. Right parietal scalp soft tissue swelling, without acute
intracranial abnormality.
2. Small vessel ischemic change and atherosclerosis.

## 2016-03-28 ENCOUNTER — Ambulatory Visit (INDEPENDENT_AMBULATORY_CARE_PROVIDER_SITE_OTHER): Payer: Medicare Other | Admitting: Pharmacist

## 2016-03-28 ENCOUNTER — Encounter (INDEPENDENT_AMBULATORY_CARE_PROVIDER_SITE_OTHER): Payer: Self-pay | Admitting: Internal Medicine

## 2016-03-28 DIAGNOSIS — Z5181 Encounter for therapeutic drug level monitoring: Secondary | ICD-10-CM | POA: Diagnosis not present

## 2016-03-28 DIAGNOSIS — I4891 Unspecified atrial fibrillation: Secondary | ICD-10-CM

## 2016-03-28 LAB — POCT INR: INR: 2.2

## 2016-04-02 DIAGNOSIS — M549 Dorsalgia, unspecified: Secondary | ICD-10-CM | POA: Diagnosis not present

## 2016-04-02 DIAGNOSIS — E785 Hyperlipidemia, unspecified: Secondary | ICD-10-CM | POA: Diagnosis not present

## 2016-04-02 DIAGNOSIS — K5190 Ulcerative colitis, unspecified, without complications: Secondary | ICD-10-CM | POA: Diagnosis not present

## 2016-04-10 DIAGNOSIS — L820 Inflamed seborrheic keratosis: Secondary | ICD-10-CM | POA: Diagnosis not present

## 2016-04-10 DIAGNOSIS — C44519 Basal cell carcinoma of skin of other part of trunk: Secondary | ICD-10-CM | POA: Diagnosis not present

## 2016-04-23 DIAGNOSIS — M545 Low back pain: Secondary | ICD-10-CM | POA: Diagnosis not present

## 2016-04-23 DIAGNOSIS — M9903 Segmental and somatic dysfunction of lumbar region: Secondary | ICD-10-CM | POA: Diagnosis not present

## 2016-04-23 DIAGNOSIS — M9902 Segmental and somatic dysfunction of thoracic region: Secondary | ICD-10-CM | POA: Diagnosis not present

## 2016-04-23 DIAGNOSIS — M9905 Segmental and somatic dysfunction of pelvic region: Secondary | ICD-10-CM | POA: Diagnosis not present

## 2016-04-25 ENCOUNTER — Ambulatory Visit (INDEPENDENT_AMBULATORY_CARE_PROVIDER_SITE_OTHER): Payer: Medicare Other | Admitting: *Deleted

## 2016-04-25 DIAGNOSIS — I4891 Unspecified atrial fibrillation: Secondary | ICD-10-CM

## 2016-04-25 DIAGNOSIS — Z5181 Encounter for therapeutic drug level monitoring: Secondary | ICD-10-CM

## 2016-04-25 LAB — POCT INR: INR: 2.8

## 2016-04-27 DIAGNOSIS — M9905 Segmental and somatic dysfunction of pelvic region: Secondary | ICD-10-CM | POA: Diagnosis not present

## 2016-04-27 DIAGNOSIS — M9902 Segmental and somatic dysfunction of thoracic region: Secondary | ICD-10-CM | POA: Diagnosis not present

## 2016-04-27 DIAGNOSIS — M9903 Segmental and somatic dysfunction of lumbar region: Secondary | ICD-10-CM | POA: Diagnosis not present

## 2016-04-27 DIAGNOSIS — M545 Low back pain: Secondary | ICD-10-CM | POA: Diagnosis not present

## 2016-05-29 DIAGNOSIS — L820 Inflamed seborrheic keratosis: Secondary | ICD-10-CM | POA: Diagnosis not present

## 2016-05-29 DIAGNOSIS — Z85828 Personal history of other malignant neoplasm of skin: Secondary | ICD-10-CM | POA: Diagnosis not present

## 2016-05-29 DIAGNOSIS — Z08 Encounter for follow-up examination after completed treatment for malignant neoplasm: Secondary | ICD-10-CM | POA: Diagnosis not present

## 2016-06-06 ENCOUNTER — Ambulatory Visit (INDEPENDENT_AMBULATORY_CARE_PROVIDER_SITE_OTHER): Payer: Medicare Other | Admitting: *Deleted

## 2016-06-06 DIAGNOSIS — I4891 Unspecified atrial fibrillation: Secondary | ICD-10-CM

## 2016-06-06 DIAGNOSIS — Z5181 Encounter for therapeutic drug level monitoring: Secondary | ICD-10-CM | POA: Diagnosis not present

## 2016-06-06 LAB — POCT INR: INR: 2.5

## 2016-06-27 ENCOUNTER — Encounter (INDEPENDENT_AMBULATORY_CARE_PROVIDER_SITE_OTHER): Payer: Self-pay | Admitting: Internal Medicine

## 2016-06-27 ENCOUNTER — Ambulatory Visit (INDEPENDENT_AMBULATORY_CARE_PROVIDER_SITE_OTHER): Payer: Medicare Other | Admitting: Internal Medicine

## 2016-06-27 VITALS — BP 130/52 | HR 60 | Temp 98.0°F | Ht 75.0 in | Wt 313.4 lb

## 2016-06-27 DIAGNOSIS — K5120 Ulcerative (chronic) proctitis without complications: Secondary | ICD-10-CM

## 2016-06-27 NOTE — Progress Notes (Signed)
Subjective:    Patient ID: Dennis Ray, Sr, male    DOB: 03-06-1941, 75 y.o.   MRN: [id number]  HPIHere today for f/u of his UC. He tells me he is doing good. He says his stools are the best he has ever had. Has a BM x 1 every other day. No melena or BRRB. No abdominal pain .  Appetite is good. No weight loss. He has actually gained about No GI problems. He does have some flatus at times. Last seen in October of 2016. Wt 301 ht 65. Today his weight is 313.         11/19/2014 EGD & Colonoscopy  Indications: Patient is 75 year old Caucasian male was chronic ulcerative colitis and underwent extended right hemicolectomy and generally 2012 for recurrent GI bleed and nonhealing ulcers in proximal colon. He has remained in remission. Last month he was noted to have drop in his hemoglobin by 2.3 g. Hemoccults were checked and all 3 were positive and iron studies show iron deficiency anemia. He denies melena or rectal bleeding. He is undergoing diagnostic EGD and colonoscopy.  Impression:  EGD findings; Few small hyperplastic polyps gastric body otherwise normal EGD. No mucosal abnormality noted involving duodenal mucosa to suggest celiac disease.  Colonoscopy findings; Normal mucosa of distal small bowel. Mild inflammatory changes at ileocolonic anastomosis without stricture. 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   Review of Systems Past Medical History:  Diagnosis Date  . Alcohol abuse, in remission   . Atrial flutter (HCC)    Previously not any Coumadin candidate as the result of chronic GI blood loss  . Coronary artery disease    IMI in 09/1999->RCA stent, nl LAD, 30%, distal OM, RCA  95% distal lesion; nuclear in 07/2010-moderate-sized Inferior infarction; normal EF by echo in 07/2010  . GERD (gastroesophageal reflux disease)   .  Hyperlipidemia   . Hypertension   . Myocardial infarction   . Sleep apnea    STOP BANG score of 4  . Ulcerative colitis    s/p subtotal colectomy    Past Surgical History:  Procedure Laterality Date  . CARDIAC CATHETERIZATION    . CATARACT EXTRACTION W/PHACO  02/26/2012   Procedure: CATARACT EXTRACTION PHACO AND INTRAOCULAR LENS PLACEMENT (IOC);  Surgeon: Elta Guadeloupe T. Gershon Crane, MD;  Location: [facility];  Service: Ophthalmology;  Laterality: Left;  CDE 8.94  . COLONOSCOPY  08/2010   UNDER FLURO  . COLONOSCOPY  04/02/07   INCOMPLT  . COLONOSCOPY  12/04/06   INCOMP  . COLONOSCOPY  09/25/04   ROURK  . COLONOSCOPY N/A 11/19/2014   Procedure: COLONOSCOPY;  Surgeon: Rogene Houston, MD;  Location: [facility];  Service: Endoscopy;  Laterality: N/A;  815  . CORONARY STENT PLACEMENT    . ESOPHAGOGASTRODUODENOSCOPY N/A 11/19/2014   Procedure: ESOPHAGOGASTRODUODENOSCOPY (EGD);  Surgeon: Rogene Houston, MD;  Location: [facility];  Service: Endoscopy;  Laterality: N/A;  . SUBTOTAL COLECTOMY  2012   secondary to ulcerative colitis and LGI bleeding  . UPPER GASTROINTESTINAL ENDOSCOPY  08/18/2010   NUR    Allergies  Allergen Reactions  . Doxycycline     THROAT SWELLS    Current Outpatient Prescriptions on File Prior to Visit  Medication Sig Dispense Refill  . amLODipine (NORVASC) 2.5 MG tablet Take 5 mg by mouth daily.     [name] atorvastatin (LIPITOR) 80 MG tablet Take 1 tablet (80 mg total) by mouth daily. 30 tablet 11  . cloNIDine (CATAPRES) 0.2  MG tablet Take 0.2 mg by mouth 2 (two) times daily.    . DELZICOL 400 MG CPDR DR capsule TAKE 2 CAPSULES BY MOUTH TWICE DAILY. 120 capsule 6  . HYDROcodone-acetaminophen (NORCO) 7.5-325 MG per tablet Take 1 tablet by mouth 3 (three) times daily as needed for moderate pain.     [name] lisinopril (PRINIVIL,ZESTRIL) 40 MG tablet Take 40 mg by mouth daily.    . metoprolol (LOPRESSOR) 50 MG tablet Take 50 mg by mouth 2 (two) times daily.      [name] NITROSTAT 0.4 MG  SL tablet PLACE 1 TAB UNDER TONGUE EVERY 5 MIN IF NEEDED FOR CHEST PAIN. MAY USE 3 TIMES.NO RELIEF CALL 911. 25 tablet 4  . potassium chloride SA (K-DUR,KLOR-CON) 20 MEQ tablet Take 20 mEq by mouth daily.      [name] terazosin (HYTRIN) 5 MG capsule TAKE 2 CAPSULES BY MOUTH ONCE DAILY. (Patient taking differently: 5 mg. TAKE 1 CAPSULES BY MOUTH ONCE DAILY.) 60 capsule 6  . warfarin (COUMADIN) 2 MG tablet Take 1 tablet daily except 2 tablets on Wednesdays and Saturdays 40 tablet 4  . zolpidem (AMBIEN) 10 MG tablet Take 10 mg by mouth at bedtime.      No current facility-administered medications on file prior to visit.        Objective:   Physical Exam Blood pressure (!) 130/52, pulse 60, temperature 98 F (36.7 C), height 6\' 3"  (1.905 m), weight (!) 313 lb 6.4 oz (142.2 kg).  Alert and oriented. Skin warm and dry. Oral mucosa is moist.   . Sclera anicteric, conjunctivae is pink. Thyroid not enlarged. No cervical lymphadenopathy. Lungs clear. Heart regular rate and rhythm.  Abdomen is soft. Bowel sounds are positive. No hepatomegaly. No abdominal masses felt. No tenderness.  No edema to lower extremities. .      Assessment & Plan:  UC. He seems to be doing well. Will get labs from Dr. Willey Blade.  Samples of Delzicol given to patient.  OV in 1 year

## 2016-06-27 NOTE — Patient Instructions (Signed)
CBC today.  OV in 1 year. Samples of Delzicol give nto patient

## 2016-07-04 ENCOUNTER — Other Ambulatory Visit: Payer: Self-pay | Admitting: Cardiology

## 2016-07-06 ENCOUNTER — Other Ambulatory Visit: Payer: Self-pay

## 2016-07-06 MED ORDER — ATORVASTATIN CALCIUM 80 MG PO TABS: 80.0000 mg | ORAL_TABLET | Freq: Every day | ORAL | 0 refills | Status: DC

## 2016-07-06 NOTE — Telephone Encounter (Signed)
Needs fu apt, 1 month supply given

## 2016-07-18 ENCOUNTER — Ambulatory Visit (INDEPENDENT_AMBULATORY_CARE_PROVIDER_SITE_OTHER): Payer: Medicare Other | Admitting: *Deleted

## 2016-07-18 DIAGNOSIS — I4891 Unspecified atrial fibrillation: Secondary | ICD-10-CM

## 2016-07-18 DIAGNOSIS — Z5181 Encounter for therapeutic drug level monitoring: Secondary | ICD-10-CM | POA: Diagnosis not present

## 2016-07-18 LAB — POCT INR: INR: 3.4

## 2016-07-23 DIAGNOSIS — M9902 Segmental and somatic dysfunction of thoracic region: Secondary | ICD-10-CM | POA: Diagnosis not present

## 2016-07-23 DIAGNOSIS — E876 Hypokalemia: Secondary | ICD-10-CM | POA: Diagnosis not present

## 2016-07-23 DIAGNOSIS — I2510 Atherosclerotic heart disease of native coronary artery without angina pectoris: Secondary | ICD-10-CM | POA: Diagnosis not present

## 2016-07-23 DIAGNOSIS — M545 Low back pain: Secondary | ICD-10-CM | POA: Diagnosis not present

## 2016-07-23 DIAGNOSIS — M9905 Segmental and somatic dysfunction of pelvic region: Secondary | ICD-10-CM | POA: Diagnosis not present

## 2016-07-23 DIAGNOSIS — Z79899 Other long term (current) drug therapy: Secondary | ICD-10-CM | POA: Diagnosis not present

## 2016-07-23 DIAGNOSIS — M9903 Segmental and somatic dysfunction of lumbar region: Secondary | ICD-10-CM | POA: Diagnosis not present

## 2016-07-31 DIAGNOSIS — I10 Essential (primary) hypertension: Secondary | ICD-10-CM | POA: Diagnosis not present

## 2016-07-31 DIAGNOSIS — R609 Edema, unspecified: Secondary | ICD-10-CM | POA: Diagnosis not present

## 2016-07-31 DIAGNOSIS — I482 Chronic atrial fibrillation: Secondary | ICD-10-CM | POA: Diagnosis not present

## 2016-07-31 DIAGNOSIS — Z23 Encounter for immunization: Secondary | ICD-10-CM | POA: Diagnosis not present

## 2016-07-31 DIAGNOSIS — G8929 Other chronic pain: Secondary | ICD-10-CM | POA: Diagnosis not present

## 2016-08-04 ENCOUNTER — Other Ambulatory Visit: Payer: Self-pay | Admitting: Cardiology

## 2016-08-08 ENCOUNTER — Ambulatory Visit (INDEPENDENT_AMBULATORY_CARE_PROVIDER_SITE_OTHER): Payer: Medicare Other | Admitting: *Deleted

## 2016-08-08 DIAGNOSIS — I4891 Unspecified atrial fibrillation: Secondary | ICD-10-CM

## 2016-08-08 DIAGNOSIS — Z5181 Encounter for therapeutic drug level monitoring: Secondary | ICD-10-CM

## 2016-08-08 LAB — POCT INR: INR: 1.8

## 2016-08-29 ENCOUNTER — Ambulatory Visit (INDEPENDENT_AMBULATORY_CARE_PROVIDER_SITE_OTHER): Payer: Medicare Other | Admitting: *Deleted

## 2016-08-29 DIAGNOSIS — Z5181 Encounter for therapeutic drug level monitoring: Secondary | ICD-10-CM | POA: Diagnosis not present

## 2016-08-29 DIAGNOSIS — I4891 Unspecified atrial fibrillation: Secondary | ICD-10-CM

## 2016-08-29 LAB — POCT INR: INR: 2.5

## 2016-09-01 ENCOUNTER — Other Ambulatory Visit: Payer: Self-pay | Admitting: Cardiology

## 2016-09-03 ENCOUNTER — Other Ambulatory Visit: Payer: Self-pay | Admitting: Cardiology

## 2016-09-26 ENCOUNTER — Ambulatory Visit (INDEPENDENT_AMBULATORY_CARE_PROVIDER_SITE_OTHER): Payer: Medicare Other | Admitting: *Deleted

## 2016-09-26 DIAGNOSIS — I4891 Unspecified atrial fibrillation: Secondary | ICD-10-CM

## 2016-09-26 DIAGNOSIS — Z5181 Encounter for therapeutic drug level monitoring: Secondary | ICD-10-CM | POA: Diagnosis not present

## 2016-09-26 LAB — POCT INR: INR: 3.3

## 2016-10-10 ENCOUNTER — Telehealth (INDEPENDENT_AMBULATORY_CARE_PROVIDER_SITE_OTHER): Payer: Self-pay | Admitting: Internal Medicine

## 2016-10-10 NOTE — Telephone Encounter (Signed)
Patient called, he would like samples of Delzicol.  [phone]

## 2016-10-10 NOTE — Telephone Encounter (Signed)
We do not have any samples. Please call patient

## 2016-10-12 NOTE — Telephone Encounter (Signed)
I called the patient and advised him.

## 2016-10-15 DIAGNOSIS — M9905 Segmental and somatic dysfunction of pelvic region: Secondary | ICD-10-CM | POA: Diagnosis not present

## 2016-10-15 DIAGNOSIS — M9903 Segmental and somatic dysfunction of lumbar region: Secondary | ICD-10-CM | POA: Diagnosis not present

## 2016-10-15 DIAGNOSIS — M9902 Segmental and somatic dysfunction of thoracic region: Secondary | ICD-10-CM | POA: Diagnosis not present

## 2016-10-15 DIAGNOSIS — M545 Low back pain: Secondary | ICD-10-CM | POA: Diagnosis not present

## 2016-10-17 ENCOUNTER — Encounter: Payer: Self-pay | Admitting: *Deleted

## 2016-10-23 ENCOUNTER — Encounter: Payer: Self-pay | Admitting: Internal Medicine

## 2016-10-23 DIAGNOSIS — M9903 Segmental and somatic dysfunction of lumbar region: Secondary | ICD-10-CM | POA: Diagnosis not present

## 2016-10-23 DIAGNOSIS — M9902 Segmental and somatic dysfunction of thoracic region: Secondary | ICD-10-CM | POA: Diagnosis not present

## 2016-10-23 DIAGNOSIS — M9905 Segmental and somatic dysfunction of pelvic region: Secondary | ICD-10-CM | POA: Diagnosis not present

## 2016-10-23 DIAGNOSIS — M545 Low back pain: Secondary | ICD-10-CM | POA: Diagnosis not present

## 2016-10-26 DIAGNOSIS — I2510 Atherosclerotic heart disease of native coronary artery without angina pectoris: Secondary | ICD-10-CM | POA: Diagnosis not present

## 2016-10-26 DIAGNOSIS — D509 Iron deficiency anemia, unspecified: Secondary | ICD-10-CM | POA: Diagnosis not present

## 2016-10-26 DIAGNOSIS — Z79899 Other long term (current) drug therapy: Secondary | ICD-10-CM | POA: Diagnosis not present

## 2016-10-28 ENCOUNTER — Emergency Department (HOSPITAL_COMMUNITY)
Admission: EM | Admit: 2016-10-28 | Discharge: 2016-10-28 | Disposition: A | Discharge status: Home / Self Care | Payer: Medicare Other | Attending: Emergency Medicine | Admitting: Emergency Medicine

## 2016-10-28 ENCOUNTER — Encounter (HOSPITAL_COMMUNITY): Payer: Self-pay | Admitting: Emergency Medicine

## 2016-10-28 ENCOUNTER — Emergency Department (HOSPITAL_COMMUNITY): Payer: Medicare Other

## 2016-10-28 DIAGNOSIS — Z79899 Other long term (current) drug therapy: Secondary | ICD-10-CM | POA: Insufficient documentation

## 2016-10-28 DIAGNOSIS — M545 Low back pain, unspecified: Secondary | ICD-10-CM

## 2016-10-28 DIAGNOSIS — I10 Essential (primary) hypertension: Secondary | ICD-10-CM | POA: Diagnosis not present

## 2016-10-28 DIAGNOSIS — I252 Old myocardial infarction: Secondary | ICD-10-CM | POA: Insufficient documentation

## 2016-10-28 DIAGNOSIS — I2510 Atherosclerotic heart disease of native coronary artery without angina pectoris: Secondary | ICD-10-CM | POA: Insufficient documentation

## 2016-10-28 MED ORDER — MORPHINE SULFATE (PF) 4 MG/ML IV SOLN
4.0000 mg | Freq: Once | INTRAVENOUS | Status: AC
  Administered 2016-10-28: 4 mg via INTRAVENOUS
  Filled 2016-10-28: qty 1

## 2016-10-28 MED ORDER — OXYCODONE-ACETAMINOPHEN 5-325 MG PO TABS: 1.0000 | ORAL_TABLET | ORAL | 0 refills | Status: AC | PRN

## 2016-10-28 MED ORDER — SODIUM CHLORIDE 0.9 % IV BOLUS (SEPSIS)
500.0000 mL | Freq: Once | INTRAVENOUS | Status: AC
  Administered 2016-10-28: 500 mL via INTRAVENOUS

## 2016-10-28 MED ORDER — KETOROLAC TROMETHAMINE 30 MG/ML IJ SOLN
30.0000 mg | Freq: Once | INTRAMUSCULAR | Status: AC
  Administered 2016-10-28: 30 mg via INTRAVENOUS
  Filled 2016-10-28: qty 1

## 2016-10-28 MED ORDER — CYCLOBENZAPRINE HCL 10 MG PO TABS: 10.0000 mg | ORAL_TABLET | Freq: Two times a day (BID) | ORAL | 0 refills | Status: AC | PRN

## 2016-10-28 MED ORDER — ONDANSETRON HCL 4 MG/2ML IJ SOLN
4.0000 mg | Freq: Once | INTRAMUSCULAR | Status: AC
  Administered 2016-10-28: 4 mg via INTRAVENOUS
  Filled 2016-10-28: qty 2

## 2016-10-28 NOTE — ED Triage Notes (Signed)
Patient complains of lower back pain and states history of lower back pain. States pain worsening in past 3 days.

## 2016-10-28 NOTE — ED Provider Notes (Signed)
[facility] DEPT Provider Note   CSN: [id number] Arrival date & time: 10/28/16  1222  By signing my name below, I, Dennis Ray, attest that this documentation has been prepared under the direction and in the presence of Nat Christen, MD. Electronically Signed: Hilbert Ray, Scribe. 10/28/16. 2:00 PM. History   Chief Complaint Chief Complaint  Patient presents with  . Back Pain    The history is provided by the patient. No language interpreter was used.  HPI Comments: Dennis Ray is a 76 y.o. male who presents to the Emergency Department complaining of worsening right lower lumbar back pain for the past 3 days. He states that the pain doesn't radiate to his legs. He states that he hasn't taken anything for the pain. He denies any recent falls or injuries. He typically is able to ambulate without any difficulty but has been unable to recently. Patient states that he has a hx of lower back pain. He currently lives at home with his son.  Past Medical History:  Diagnosis Date  . Alcohol abuse, in remission   . Atrial flutter (HCC)    Previously not any Coumadin candidate as the result of chronic GI blood loss  . Coronary artery disease    IMI in 09/1999->RCA stent, nl LAD, 30%, distal OM, RCA  95% distal lesion; nuclear in 07/2010-moderate-sized Inferior infarction; normal EF by echo in 07/2010  . GERD (gastroesophageal reflux disease)   . Hyperlipidemia   . Hypertension   . Myocardial infarction   . Sleep apnea    STOP BANG score of 4  . Ulcerative colitis    s/p subtotal colectomy    Patient Active Problem List   Diagnosis Date Noted  . Encounter for therapeutic drug monitoring 10/29/2013  . GERD (gastroesophageal reflux disease)   . Alcohol abuse, in remission   . Chronic anticoagulation 01/29/2011  . Coronary artery disease   . Atrial fibrillation ([place])   . Ulcerative colitis   . Hyperlipidemia 06/02/2008  . Hypertension 06/02/2008    Past Surgical History:    Procedure Laterality Date  . CARDIAC CATHETERIZATION    . CATARACT EXTRACTION W/PHACO  02/26/2012   Procedure: CATARACT EXTRACTION PHACO AND INTRAOCULAR LENS PLACEMENT (IOC);  Surgeon: Elta Guadeloupe T. Gershon Crane, MD;  Location: [facility];  Service: Ophthalmology;  Laterality: Left;  CDE 8.94  . COLONOSCOPY  08/2010   UNDER FLURO  . COLONOSCOPY  04/02/07   INCOMPLT  . COLONOSCOPY  12/04/06   INCOMP  . COLONOSCOPY  09/25/04   ROURK  . COLONOSCOPY N/A 11/19/2014   Procedure: COLONOSCOPY;  Surgeon: Rogene Houston, MD;  Location: [facility];  Service: Endoscopy;  Laterality: N/A;  815  . CORONARY STENT PLACEMENT    . ESOPHAGOGASTRODUODENOSCOPY N/A 11/19/2014   Procedure: ESOPHAGOGASTRODUODENOSCOPY (EGD);  Surgeon: Rogene Houston, MD;  Location: [facility];  Service: Endoscopy;  Laterality: N/A;  . SUBTOTAL COLECTOMY  2012   secondary to ulcerative colitis and LGI bleeding  . UPPER GASTROINTESTINAL ENDOSCOPY  08/18/2010   NUR       Home Medications    Prior to Admission medications   Medication Sig Start Date End Date Taking? Authorizing Provider  amLODipine (NORVASC) 2.5 MG tablet Take 5 mg by mouth daily.  02/25/12  Yes Yehuda Savannah, MD  atorvastatin (LIPITOR) 80 MG tablet TAKE ONE TABLET BY MOUTH DAILY. 09/03/16  Yes Arnoldo Lenis, MD  cloNIDine (CATAPRES) 0.2 MG tablet Take 0.2 mg by mouth 2 (two) times daily.  Yes Historical Provider, MD  DELZICOL 400 MG CPDR DR capsule TAKE 2 CAPSULES BY MOUTH TWICE DAILY. 11/04/14  Yes Butch Penny, NP  furosemide (LASIX) 40 MG tablet Take 40 mg by mouth daily.    Yes Historical Provider, MD  HYDROcodone-acetaminophen (NORCO) 7.5-325 MG per tablet Take 1 tablet by mouth 3 (three) times daily as needed for moderate pain.    Yes Historical Provider, MD  lisinopril (PRINIVIL,ZESTRIL) 40 MG tablet Take 40 mg by mouth daily.   Yes Historical Provider, MD  metoprolol (LOPRESSOR) 50 MG tablet Take 50 mg by mouth 2 (two) times daily.     Yes Historical  Provider, MD  NITROSTAT 0.4 MG SL tablet PLACE 1 TAB UNDER TONGUE EVERY 5 MIN IF NEEDED FOR CHEST PAIN. MAY USE 3 TIMES.NO RELIEF CALL 911. 05/09/15  Yes Arnoldo Lenis, MD  potassium chloride SA (K-DUR,KLOR-CON) 20 MEQ tablet Take 20 mEq by mouth daily.     Yes Historical Provider, MD  terazosin (HYTRIN) 5 MG capsule TAKE 2 CAPSULES BY MOUTH ONCE DAILY. Patient taking differently: 5 mg. TAKE 1 CAPSULES BY MOUTH ONCE DAILY. 07/02/13  Yes Lendon Colonel, NP  warfarin (COUMADIN) 2 MG tablet TAKE 1 TABLET BY MOUTH ONCE DAILY EXCEPT 2 TABLETS ON WEDNESDAYS AND SATURDAYS. 07/04/16  Yes Arnoldo Lenis, MD  zolpidem (AMBIEN) 10 MG tablet Take 10 mg by mouth at bedtime.  07/05/14  Yes Historical Provider, MD  cyclobenzaprine (FLEXERIL) 10 MG tablet Take 1 tablet (10 mg total) by mouth 2 (two) times daily as needed for muscle spasms. 10/28/16   Nat Christen, MD  oxyCODONE-acetaminophen (PERCOCET) 5-325 MG tablet Take 1-2 tablets by mouth every 4 (four) hours as needed. 10/28/16   Nat Christen, MD    Family History Family History  Problem Relation Age of Onset  . Healthy Sister   . Irritable bowel syndrome Daughter   . Anesthesia problems Neg Hx   . Malignant hyperthermia Neg Hx   . Pseudochol deficiency Neg Hx     Social History Social History  Substance Use Topics  . Smoking status: Never Smoker  . Smokeless tobacco: Never Used  . Alcohol use 0.0 oz/week     Comment: 12oz beer 1-2 times weekly     Allergies   Doxycycline   Review of Systems Review of Systems A complete 10 system review of systems was obtained and all systems are negative except as noted in the HPI and PMH.    Physical Exam Updated Vital Signs BP 167/86 (BP Location: Right Arm)   Pulse 71   Temp 97.5 F (36.4 C) (Oral)   Resp 18   Ht 6\' 5"  (1.956 m)   Wt 300 lb (136.1 kg)   SpO2 98%   BMI 35.57 kg/m   Physical Exam  Constitutional: He is oriented to person, place, and time.  Overweight.  HENT:    Head: Normocephalic and atraumatic.  Eyes: Conjunctivae are normal.  Neck: Neck supple.  Cardiovascular: Normal rate and regular rhythm.   Pulmonary/Chest: Effort normal and breath sounds normal.  Abdominal: Soft. Bowel sounds are normal.  Musculoskeletal:  Tenderness to the right L3 area.  Neurological: He is alert and oriented to person, place, and time.  Skin:  Chronic bilateral venous stasis ulcers.  Psychiatric: He has a normal mood and affect. His behavior is normal.  Nursing note and vitals reviewed.    ED Treatments / Results  DIAGNOSTIC STUDIES: Oxygen Saturation is 97% on RA, normal by my  interpretation.    COORDINATION OF CARE: 1:32 PM Discussed treatment plan with pt at bedside and pt agreed to plan. I will check his X-ray and start him on pain medications.  Labs (all labs ordered are listed, but only abnormal results are displayed) Labs Reviewed - No data to display  EKG  EKG Interpretation  Date/Time:  Sunday October 28 2016 13:25:10 EST Ventricular Rate:  68 PR Interval:    QRS Duration: 132 QT Interval:  443 QTC Calculation: 472 R Axis:   38 Text Interpretation:  Atrial fibrillation Left bundle branch block Confirmed by Cella Cappello  MD, Devlyn Retter ([id number]) on 10/28/2016 1:42:06 PM       Radiology Dg Lumbar Spine Complete  Result Date: 10/28/2016 CLINICAL DATA:  Low back pain. EXAM: LUMBAR SPINE - COMPLETE 4+ VIEW COMPARISON:  None. FINDINGS: Multilevel degenerative changes with no fracture or traumatic malalignment. Atherosclerosis and splenic artery calcifications. Air-filled mildly prominent loops of bowel in the right abdomen, incompletely evaluated. IMPRESSION: 1. Degenerative changes in the spine with no fracture or traumatic malalignment. 2. Air-filled mildly prominent loops of bowel in the right side of the abdomen, incompletely characterized. Recommend clinical correlation. 3. Atherosclerosis. Electronically Signed   By: Dorise Bullion III M.D   On:  10/28/2016 15:13    Procedures Procedures (including critical care time)  Medications Ordered in ED Medications  sodium chloride 0.9 % bolus 500 mL (0 mLs Intravenous Stopped 10/28/16 1512)  morphine 4 MG/ML injection 4 mg (4 mg Intravenous Given 10/28/16 1412)  ketorolac (TORADOL) 30 MG/ML injection 30 mg (30 mg Intravenous Given 10/28/16 1412)  ondansetron (ZOFRAN) injection 4 mg (4 mg Intravenous Given 10/28/16 1412)  morphine 4 MG/ML injection 4 mg (4 mg Intravenous Given 10/28/16 1639)     Initial Impression / Assessment and Plan / ED Course  I have reviewed the triage vital signs and the nursing notes.  Pertinent labs & imaging results that were available during my care of the patient were reviewed by me and considered in my medical decision making (see chart for details).     No bowel or bladder incontinence. This has been a chronic problem. Plain films of the lumbar spine reveal degenerative changes, but no fracture. Patient feels much better after pain management. Discharge medication Percocet and Flexeril 10 mg.  Final Clinical Impressions(s) / ED Diagnoses   Final diagnoses:  Right-sided low back pain without sciatica, unspecified chronicity    New Prescriptions Discharge Medication List as of 10/28/2016  5:05 PM    START taking these medications   Details  cyclobenzaprine (FLEXERIL) 10 MG tablet Take 1 tablet (10 mg total) by mouth 2 (two) times daily as needed for muscle spasms., Starting Sun 10/28/2016, Print    oxyCODONE-acetaminophen (PERCOCET) 5-325 MG tablet Take 1-2 tablets by mouth every 4 (four) hours as needed., Starting Sun 10/28/2016, Print       I personally performed the services described in this documentation, which was scribed in my presence. The recorded information has been reviewed and is accurate.     Nat Christen, MD 10/28/16 [phone]

## 2016-10-28 NOTE — Discharge Instructions (Signed)
Meds for pain and spasm.  Follow up your dr

## 2016-10-29 ENCOUNTER — Other Ambulatory Visit: Payer: Self-pay | Admitting: Cardiology

## 2016-11-01 DIAGNOSIS — M545 Low back pain: Secondary | ICD-10-CM | POA: Diagnosis not present

## 2016-11-01 DIAGNOSIS — Z6841 Body Mass Index (BMI) 40.0 and over, adult: Secondary | ICD-10-CM | POA: Diagnosis not present

## 2016-11-01 DIAGNOSIS — R600 Localized edema: Secondary | ICD-10-CM | POA: Diagnosis not present

## 2016-11-01 DIAGNOSIS — I10 Essential (primary) hypertension: Secondary | ICD-10-CM | POA: Diagnosis not present

## 2016-11-16 DIAGNOSIS — M9903 Segmental and somatic dysfunction of lumbar region: Secondary | ICD-10-CM | POA: Diagnosis not present

## 2016-11-16 DIAGNOSIS — M545 Low back pain: Secondary | ICD-10-CM | POA: Diagnosis not present

## 2016-11-16 DIAGNOSIS — M9902 Segmental and somatic dysfunction of thoracic region: Secondary | ICD-10-CM | POA: Diagnosis not present

## 2016-11-16 DIAGNOSIS — M9905 Segmental and somatic dysfunction of pelvic region: Secondary | ICD-10-CM | POA: Diagnosis not present

## 2016-11-22 ENCOUNTER — Encounter (HOSPITAL_BASED_OUTPATIENT_CLINIC_OR_DEPARTMENT_OTHER): Payer: Medicare Other | Attending: Internal Medicine

## 2016-11-22 DIAGNOSIS — L97821 Non-pressure chronic ulcer of other part of left lower leg limited to breakdown of skin: Secondary | ICD-10-CM | POA: Diagnosis not present

## 2016-11-22 DIAGNOSIS — I10 Essential (primary) hypertension: Secondary | ICD-10-CM | POA: Insufficient documentation

## 2016-11-22 DIAGNOSIS — L97221 Non-pressure chronic ulcer of left calf limited to breakdown of skin: Secondary | ICD-10-CM | POA: Diagnosis not present

## 2016-11-22 DIAGNOSIS — I252 Old myocardial infarction: Secondary | ICD-10-CM | POA: Diagnosis not present

## 2016-11-22 DIAGNOSIS — L97811 Non-pressure chronic ulcer of other part of right lower leg limited to breakdown of skin: Secondary | ICD-10-CM | POA: Insufficient documentation

## 2016-11-22 DIAGNOSIS — L97211 Non-pressure chronic ulcer of right calf limited to breakdown of skin: Secondary | ICD-10-CM | POA: Diagnosis not present

## 2016-11-22 DIAGNOSIS — I872 Venous insufficiency (chronic) (peripheral): Secondary | ICD-10-CM | POA: Diagnosis not present

## 2016-11-22 DIAGNOSIS — I2510 Atherosclerotic heart disease of native coronary artery without angina pectoris: Secondary | ICD-10-CM | POA: Insufficient documentation

## 2016-11-23 DIAGNOSIS — I110 Hypertensive heart disease with heart failure: Secondary | ICD-10-CM | POA: Diagnosis not present

## 2016-11-23 DIAGNOSIS — M2550 Pain in unspecified joint: Secondary | ICD-10-CM | POA: Diagnosis not present

## 2016-11-23 DIAGNOSIS — E784 Other hyperlipidemia: Secondary | ICD-10-CM | POA: Diagnosis not present

## 2016-11-28 ENCOUNTER — Other Ambulatory Visit: Payer: Self-pay | Admitting: Cardiology

## 2016-11-29 ENCOUNTER — Encounter (HOSPITAL_BASED_OUTPATIENT_CLINIC_OR_DEPARTMENT_OTHER): Payer: Medicare Other | Attending: Internal Medicine

## 2016-12-07 ENCOUNTER — Inpatient Hospital Stay (HOSPITAL_COMMUNITY): Admission: RE | Admit: 2016-12-07 | Payer: Medicare Other | Source: Ambulatory Visit

## 2016-12-07 ENCOUNTER — Other Ambulatory Visit: Payer: Self-pay | Admitting: Nurse Practitioner

## 2016-12-07 DIAGNOSIS — L97919 Non-pressure chronic ulcer of unspecified part of right lower leg with unspecified severity: Secondary | ICD-10-CM

## 2016-12-21 ENCOUNTER — Encounter (HOSPITAL_BASED_OUTPATIENT_CLINIC_OR_DEPARTMENT_OTHER): Payer: Medicare Other | Attending: Internal Medicine

## 2016-12-21 DIAGNOSIS — I872 Venous insufficiency (chronic) (peripheral): Secondary | ICD-10-CM | POA: Diagnosis not present

## 2016-12-21 DIAGNOSIS — I10 Essential (primary) hypertension: Secondary | ICD-10-CM | POA: Insufficient documentation

## 2016-12-21 DIAGNOSIS — L97812 Non-pressure chronic ulcer of other part of right lower leg with fat layer exposed: Secondary | ICD-10-CM | POA: Diagnosis not present

## 2016-12-21 DIAGNOSIS — L97821 Non-pressure chronic ulcer of other part of left lower leg limited to breakdown of skin: Secondary | ICD-10-CM | POA: Insufficient documentation

## 2016-12-21 DIAGNOSIS — I2510 Atherosclerotic heart disease of native coronary artery without angina pectoris: Secondary | ICD-10-CM | POA: Diagnosis not present

## 2016-12-21 DIAGNOSIS — I890 Lymphedema, not elsewhere classified: Secondary | ICD-10-CM | POA: Diagnosis not present

## 2016-12-21 DIAGNOSIS — I252 Old myocardial infarction: Secondary | ICD-10-CM | POA: Insufficient documentation

## 2016-12-21 DIAGNOSIS — L97811 Non-pressure chronic ulcer of other part of right lower leg limited to breakdown of skin: Secondary | ICD-10-CM | POA: Diagnosis not present

## 2016-12-24 ENCOUNTER — Inpatient Hospital Stay (HOSPITAL_COMMUNITY): Admission: RE | Admit: 2016-12-24 | Payer: Medicare Other | Source: Ambulatory Visit

## 2016-12-24 DIAGNOSIS — L97312 Non-pressure chronic ulcer of right ankle with fat layer exposed: Secondary | ICD-10-CM | POA: Diagnosis not present

## 2016-12-24 DIAGNOSIS — L97222 Non-pressure chronic ulcer of left calf with fat layer exposed: Secondary | ICD-10-CM | POA: Diagnosis not present

## 2016-12-24 DIAGNOSIS — I2510 Atherosclerotic heart disease of native coronary artery without angina pectoris: Secondary | ICD-10-CM | POA: Diagnosis not present

## 2016-12-24 DIAGNOSIS — L97812 Non-pressure chronic ulcer of other part of right lower leg with fat layer exposed: Secondary | ICD-10-CM | POA: Diagnosis not present

## 2016-12-24 DIAGNOSIS — I252 Old myocardial infarction: Secondary | ICD-10-CM | POA: Diagnosis not present

## 2016-12-24 DIAGNOSIS — L97211 Non-pressure chronic ulcer of right calf limited to breakdown of skin: Secondary | ICD-10-CM | POA: Diagnosis not present

## 2016-12-24 DIAGNOSIS — L97811 Non-pressure chronic ulcer of other part of right lower leg limited to breakdown of skin: Secondary | ICD-10-CM | POA: Diagnosis not present

## 2016-12-24 DIAGNOSIS — I872 Venous insufficiency (chronic) (peripheral): Secondary | ICD-10-CM | POA: Diagnosis not present

## 2016-12-24 DIAGNOSIS — Z7901 Long term (current) use of anticoagulants: Secondary | ICD-10-CM | POA: Diagnosis not present

## 2016-12-24 DIAGNOSIS — I10 Essential (primary) hypertension: Secondary | ICD-10-CM | POA: Diagnosis not present

## 2016-12-24 DIAGNOSIS — L97821 Non-pressure chronic ulcer of other part of left lower leg limited to breakdown of skin: Secondary | ICD-10-CM | POA: Diagnosis not present

## 2016-12-26 ENCOUNTER — Other Ambulatory Visit: Payer: Self-pay | Admitting: Cardiology

## 2016-12-26 DIAGNOSIS — L97811 Non-pressure chronic ulcer of other part of right lower leg limited to breakdown of skin: Secondary | ICD-10-CM | POA: Diagnosis not present

## 2016-12-26 DIAGNOSIS — Z7901 Long term (current) use of anticoagulants: Secondary | ICD-10-CM | POA: Diagnosis not present

## 2016-12-26 DIAGNOSIS — I872 Venous insufficiency (chronic) (peripheral): Secondary | ICD-10-CM | POA: Diagnosis not present

## 2016-12-26 DIAGNOSIS — I10 Essential (primary) hypertension: Secondary | ICD-10-CM | POA: Diagnosis not present

## 2016-12-26 DIAGNOSIS — L97821 Non-pressure chronic ulcer of other part of left lower leg limited to breakdown of skin: Secondary | ICD-10-CM | POA: Diagnosis not present

## 2016-12-27 ENCOUNTER — Ambulatory Visit (HOSPITAL_COMMUNITY)
Admission: RE | Admit: 2016-12-27 | Discharge: 2016-12-27 | Disposition: A | Discharge status: Home / Self Care | Payer: Medicare Other | Source: Ambulatory Visit | Attending: Nurse Practitioner | Admitting: Nurse Practitioner

## 2016-12-27 DIAGNOSIS — L97919 Non-pressure chronic ulcer of unspecified part of right lower leg with unspecified severity: Secondary | ICD-10-CM | POA: Diagnosis not present

## 2016-12-27 DIAGNOSIS — I8393 Asymptomatic varicose veins of bilateral lower extremities: Secondary | ICD-10-CM | POA: Diagnosis not present

## 2016-12-31 ENCOUNTER — Other Ambulatory Visit: Payer: Self-pay | Admitting: Nurse Practitioner

## 2016-12-31 ENCOUNTER — Ambulatory Visit (HOSPITAL_COMMUNITY)
Admission: RE | Admit: 2016-12-31 | Discharge: 2016-12-31 | Disposition: A | Discharge status: Home / Self Care | Payer: Medicare Other | Source: Ambulatory Visit | Attending: Surgery | Admitting: Surgery

## 2016-12-31 DIAGNOSIS — L97811 Non-pressure chronic ulcer of other part of right lower leg limited to breakdown of skin: Secondary | ICD-10-CM | POA: Diagnosis not present

## 2016-12-31 DIAGNOSIS — Z7901 Long term (current) use of anticoagulants: Secondary | ICD-10-CM | POA: Diagnosis not present

## 2016-12-31 DIAGNOSIS — I872 Venous insufficiency (chronic) (peripheral): Secondary | ICD-10-CM | POA: Diagnosis not present

## 2016-12-31 DIAGNOSIS — L97909 Non-pressure chronic ulcer of unspecified part of unspecified lower leg with unspecified severity: Secondary | ICD-10-CM

## 2016-12-31 DIAGNOSIS — L97821 Non-pressure chronic ulcer of other part of left lower leg limited to breakdown of skin: Secondary | ICD-10-CM | POA: Diagnosis not present

## 2016-12-31 DIAGNOSIS — I10 Essential (primary) hypertension: Secondary | ICD-10-CM | POA: Diagnosis not present

## 2017-01-07 DIAGNOSIS — I4892 Unspecified atrial flutter: Secondary | ICD-10-CM | POA: Diagnosis not present

## 2017-01-07 DIAGNOSIS — R4182 Altered mental status, unspecified: Secondary | ICD-10-CM | POA: Diagnosis not present

## 2017-01-07 DIAGNOSIS — L97222 Non-pressure chronic ulcer of left calf with fat layer exposed: Secondary | ICD-10-CM | POA: Diagnosis not present

## 2017-01-07 DIAGNOSIS — I872 Venous insufficiency (chronic) (peripheral): Secondary | ICD-10-CM | POA: Diagnosis not present

## 2017-01-07 DIAGNOSIS — L97812 Non-pressure chronic ulcer of other part of right lower leg with fat layer exposed: Secondary | ICD-10-CM | POA: Diagnosis not present

## 2017-01-07 DIAGNOSIS — E876 Hypokalemia: Secondary | ICD-10-CM | POA: Diagnosis not present

## 2017-01-07 DIAGNOSIS — L97312 Non-pressure chronic ulcer of right ankle with fat layer exposed: Secondary | ICD-10-CM | POA: Diagnosis not present

## 2017-01-07 DIAGNOSIS — I482 Chronic atrial fibrillation: Secondary | ICD-10-CM | POA: Diagnosis not present

## 2017-01-07 DIAGNOSIS — K5190 Ulcerative colitis, unspecified, without complications: Secondary | ICD-10-CM | POA: Diagnosis not present

## 2017-01-07 DIAGNOSIS — I890 Lymphedema, not elsewhere classified: Secondary | ICD-10-CM | POA: Diagnosis not present

## 2017-01-07 DIAGNOSIS — L97212 Non-pressure chronic ulcer of right calf with fat layer exposed: Secondary | ICD-10-CM | POA: Diagnosis not present

## 2017-01-07 DIAGNOSIS — G935 Compression of brain: Secondary | ICD-10-CM | POA: Diagnosis not present

## 2017-01-07 DIAGNOSIS — S065X9A Traumatic subdural hemorrhage with loss of consciousness of unspecified duration, initial encounter: Secondary | ICD-10-CM | POA: Diagnosis not present

## 2017-01-07 DIAGNOSIS — G9349 Other encephalopathy: Secondary | ICD-10-CM | POA: Diagnosis not present

## 2017-01-08 ENCOUNTER — Encounter (HOSPITAL_COMMUNITY): Payer: Self-pay | Admitting: *Deleted

## 2017-01-08 ENCOUNTER — Inpatient Hospital Stay (HOSPITAL_COMMUNITY)
Admission: EM | Admit: 2017-01-08 | Discharge: 2017-01-15 | DRG: 082 | Disposition: E | Payer: Medicare Other | Attending: Internal Medicine | Admitting: Internal Medicine

## 2017-01-08 ENCOUNTER — Emergency Department (HOSPITAL_COMMUNITY): Payer: Medicare Other

## 2017-01-08 DIAGNOSIS — I2510 Atherosclerotic heart disease of native coronary artery without angina pectoris: Secondary | ICD-10-CM | POA: Diagnosis present

## 2017-01-08 DIAGNOSIS — I4891 Unspecified atrial fibrillation: Secondary | ICD-10-CM | POA: Diagnosis not present

## 2017-01-08 DIAGNOSIS — R29701 NIHSS score 1: Secondary | ICD-10-CM | POA: Diagnosis present

## 2017-01-08 DIAGNOSIS — S065X9A Traumatic subdural hemorrhage with loss of consciousness of unspecified duration, initial encounter: Secondary | ICD-10-CM | POA: Diagnosis present

## 2017-01-08 DIAGNOSIS — W19XXXA Unspecified fall, initial encounter: Secondary | ICD-10-CM | POA: Diagnosis present

## 2017-01-08 DIAGNOSIS — M549 Dorsalgia, unspecified: Secondary | ICD-10-CM | POA: Diagnosis present

## 2017-01-08 DIAGNOSIS — Z9049 Acquired absence of other specified parts of digestive tract: Secondary | ICD-10-CM

## 2017-01-08 DIAGNOSIS — Z8379 Family history of other diseases of the digestive system: Secondary | ICD-10-CM | POA: Diagnosis not present

## 2017-01-08 DIAGNOSIS — Z955 Presence of coronary angioplasty implant and graft: Secondary | ICD-10-CM | POA: Diagnosis not present

## 2017-01-08 DIAGNOSIS — I482 Chronic atrial fibrillation: Secondary | ICD-10-CM | POA: Diagnosis present

## 2017-01-08 DIAGNOSIS — I4892 Unspecified atrial flutter: Secondary | ICD-10-CM | POA: Diagnosis present

## 2017-01-08 DIAGNOSIS — G9349 Other encephalopathy: Secondary | ICD-10-CM | POA: Diagnosis present

## 2017-01-08 DIAGNOSIS — G4730 Sleep apnea, unspecified: Secondary | ICD-10-CM | POA: Diagnosis present

## 2017-01-08 DIAGNOSIS — R402412 Glasgow coma scale score 13-15, at arrival to emergency department: Secondary | ICD-10-CM | POA: Diagnosis present

## 2017-01-08 DIAGNOSIS — E876 Hypokalemia: Secondary | ICD-10-CM

## 2017-01-08 DIAGNOSIS — Z79899 Other long term (current) drug therapy: Secondary | ICD-10-CM | POA: Diagnosis not present

## 2017-01-08 DIAGNOSIS — R32 Unspecified urinary incontinence: Secondary | ICD-10-CM | POA: Diagnosis present

## 2017-01-08 DIAGNOSIS — Z7901 Long term (current) use of anticoagulants: Secondary | ICD-10-CM

## 2017-01-08 DIAGNOSIS — K219 Gastro-esophageal reflux disease without esophagitis: Secondary | ICD-10-CM | POA: Diagnosis present

## 2017-01-08 DIAGNOSIS — E785 Hyperlipidemia, unspecified: Secondary | ICD-10-CM | POA: Diagnosis present

## 2017-01-08 DIAGNOSIS — I252 Old myocardial infarction: Secondary | ICD-10-CM | POA: Diagnosis not present

## 2017-01-08 DIAGNOSIS — Z9842 Cataract extraction status, left eye: Secondary | ICD-10-CM

## 2017-01-08 DIAGNOSIS — G935 Compression of brain: Secondary | ICD-10-CM | POA: Diagnosis present

## 2017-01-08 DIAGNOSIS — Y92019 Unspecified place in single-family (private) house as the place of occurrence of the external cause: Secondary | ICD-10-CM

## 2017-01-08 DIAGNOSIS — Z9181 History of falling: Secondary | ICD-10-CM

## 2017-01-08 DIAGNOSIS — K5190 Ulcerative colitis, unspecified, without complications: Secondary | ICD-10-CM | POA: Diagnosis present

## 2017-01-08 DIAGNOSIS — Z961 Presence of intraocular lens: Secondary | ICD-10-CM | POA: Diagnosis present

## 2017-01-08 DIAGNOSIS — R4182 Altered mental status, unspecified: Secondary | ICD-10-CM | POA: Diagnosis not present

## 2017-01-08 DIAGNOSIS — F1011 Alcohol abuse, in remission: Secondary | ICD-10-CM | POA: Diagnosis present

## 2017-01-08 DIAGNOSIS — I10 Essential (primary) hypertension: Secondary | ICD-10-CM | POA: Diagnosis not present

## 2017-01-08 DIAGNOSIS — Z79891 Long term (current) use of opiate analgesic: Secondary | ICD-10-CM

## 2017-01-08 DIAGNOSIS — Z881 Allergy status to other antibiotic agents status: Secondary | ICD-10-CM | POA: Diagnosis not present

## 2017-01-08 DIAGNOSIS — Z781 Physical restraint status: Secondary | ICD-10-CM

## 2017-01-08 DIAGNOSIS — R410 Disorientation, unspecified: Secondary | ICD-10-CM

## 2017-01-08 DIAGNOSIS — G8929 Other chronic pain: Secondary | ICD-10-CM | POA: Diagnosis present

## 2017-01-08 LAB — RAPID URINE DRUG SCREEN, HOSP PERFORMED
Amphetamines: NOT DETECTED
BARBITURATES: NOT DETECTED
Benzodiazepines: NOT DETECTED
COCAINE: NOT DETECTED
Opiates: NOT DETECTED
TETRAHYDROCANNABINOL: NOT DETECTED

## 2017-01-08 LAB — URINALYSIS, ROUTINE W REFLEX MICROSCOPIC
BILIRUBIN URINE: NEGATIVE
Bacteria, UA: NONE SEEN
GLUCOSE, UA: NEGATIVE mg/dL
KETONES UR: NEGATIVE mg/dL
LEUKOCYTES UA: NEGATIVE
Nitrite: NEGATIVE
PH: 7 (ref 5.0–8.0)
Protein, ur: NEGATIVE mg/dL
Specific Gravity, Urine: 1.008 (ref 1.005–1.030)

## 2017-01-08 LAB — PROTIME-INR
INR: 2.93
Prothrombin Time: 31.2 seconds — ABNORMAL HIGH (ref 11.4–15.2)

## 2017-01-08 LAB — I-STAT TROPONIN, ED
Troponin i, poc: 0 ng/mL (ref 0.00–0.08)
Troponin i, poc: 0 ng/mL (ref 0.00–0.08)

## 2017-01-08 LAB — COMPREHENSIVE METABOLIC PANEL
ALT: 15 U/L — AB (ref 17–63)
AST: 22 U/L (ref 15–41)
Albumin: 3.4 g/dL — ABNORMAL LOW (ref 3.5–5.0)
Alkaline Phosphatase: 65 U/L (ref 38–126)
Anion gap: 9 (ref 5–15)
BUN: 11 mg/dL (ref 6–20)
CHLORIDE: 100 mmol/L — AB (ref 101–111)
CO2: 31 mmol/L (ref 22–32)
Calcium: 8.9 mg/dL (ref 8.9–10.3)
Creatinine, Ser: 0.75 mg/dL (ref 0.61–1.24)
GFR calc non Af Amer: >60 mL/min (ref >60)
Glucose, Bld: 118 mg/dL — ABNORMAL HIGH (ref 65–99)
POTASSIUM: 3.1 mmol/L — AB (ref 3.5–5.1)
SODIUM: 140 mmol/L (ref 135–145)
Total Bilirubin: 0.4 mg/dL (ref 0.3–1.2)
Total Protein: 6.9 g/dL (ref 6.5–8.1)

## 2017-01-08 LAB — I-STAT CG4 LACTIC ACID, ED
Lactic Acid, Venous: 1.42 mmol/L (ref 0.5–1.9)
Lactic Acid, Venous: 1.57 mmol/L (ref 0.5–1.9)

## 2017-01-08 LAB — CBC
HCT: 37.5 % — ABNORMAL LOW (ref 39.0–52.0)
Hemoglobin: 11.4 g/dL — ABNORMAL LOW (ref 13.0–17.0)
MCH: 24.6 pg — ABNORMAL LOW (ref 26.0–34.0)
MCHC: 30.4 g/dL (ref 30.0–36.0)
MCV: 80.8 fL (ref 78.0–100.0)
Platelets: 233 10*3/uL (ref 150–400)
RBC: 4.64 MIL/uL (ref 4.22–5.81)
RDW: 16.5 % — AB (ref 11.5–15.5)
WBC: 7.9 10*3/uL (ref 4.0–10.5)

## 2017-01-08 LAB — AMMONIA: Ammonia: 12 umol/L (ref 9–35)

## 2017-01-08 LAB — CBG MONITORING, ED: Glucose-Capillary: 125 mg/dL — ABNORMAL HIGH (ref 65–99)

## 2017-01-08 LAB — TSH: TSH: 1.262 u[IU]/mL (ref 0.350–4.500)

## 2017-01-08 MED ORDER — THIAMINE HCL 100 MG/ML IJ SOLN
100.0000 mg | Freq: Every day | INTRAMUSCULAR | Status: DC
  Administered 2017-01-08: 100 mg via INTRAVENOUS
  Filled 2017-01-08: qty 2

## 2017-01-08 MED ORDER — POTASSIUM CHLORIDE CRYS ER 20 MEQ PO TBCR
40.0000 meq | EXTENDED_RELEASE_TABLET | Freq: Two times a day (BID) | ORAL | Status: DC
Start: 2017-01-08 — End: 2017-01-09
  Administered 2017-01-08: 40 meq via ORAL
  Filled 2017-01-08 (×2): qty 2

## 2017-01-08 MED ORDER — ACETAMINOPHEN 650 MG RE SUPP: 650.0000 mg | RECTAL | Status: DC | PRN

## 2017-01-08 MED ORDER — LORAZEPAM 2 MG/ML IJ SOLN
1.0000 mg | Freq: Once | INTRAMUSCULAR | Status: AC
  Administered 2017-01-08: 1 mg via INTRAVENOUS

## 2017-01-08 MED ORDER — CLONIDINE HCL 0.2 MG PO TABS
0.2000 mg | ORAL_TABLET | Freq: Two times a day (BID) | ORAL | Status: DC
  Administered 2017-01-08: 0.2 mg via ORAL
  Filled 2017-01-08 (×2): qty 1

## 2017-01-08 MED ORDER — VITAMIN B-1 100 MG PO TABS: 100.0000 mg | ORAL_TABLET | Freq: Every day | ORAL | Status: DC

## 2017-01-08 MED ORDER — LORAZEPAM 2 MG/ML IJ SOLN: 1.0000 mg | Freq: Four times a day (QID) | INTRAMUSCULAR | Status: DC | PRN

## 2017-01-08 MED ORDER — LORAZEPAM 2 MG/ML IJ SOLN
INTRAMUSCULAR | Status: AC
  Administered 2017-01-08: 1 mg via INTRAVENOUS
  Filled 2017-01-08: qty 1

## 2017-01-08 MED ORDER — ACETAMINOPHEN 325 MG PO TABS: 650.0000 mg | ORAL_TABLET | ORAL | Status: DC | PRN

## 2017-01-08 MED ORDER — POTASSIUM CHLORIDE CRYS ER 20 MEQ PO TBCR
20.0000 meq | EXTENDED_RELEASE_TABLET | Freq: Every day | ORAL | Status: DC
  Filled 2017-01-08: qty 1

## 2017-01-08 MED ORDER — ASPIRIN 325 MG PO TABS
325.0000 mg | ORAL_TABLET | Freq: Every day | ORAL | Status: DC
  Filled 2017-01-08: qty 1

## 2017-01-08 MED ORDER — SODIUM CHLORIDE 0.9 % IV SOLN
INTRAVENOUS | Status: DC
  Administered 2017-01-09: via INTRAVENOUS

## 2017-01-08 MED ORDER — LORAZEPAM 1 MG PO TABS: 1.0000 mg | ORAL_TABLET | Freq: Four times a day (QID) | ORAL | Status: DC | PRN

## 2017-01-08 MED ORDER — LORAZEPAM 2 MG/ML IJ SOLN: 1.0000 mg | INTRAMUSCULAR | Status: DC | PRN

## 2017-01-08 MED ORDER — TERAZOSIN HCL 5 MG PO CAPS: 5.0000 mg | ORAL_CAPSULE | Freq: Every day | ORAL | Status: DC

## 2017-01-08 MED ORDER — METOPROLOL TARTRATE 50 MG PO TABS
50.0000 mg | ORAL_TABLET | Freq: Two times a day (BID) | ORAL | Status: DC
  Filled 2017-01-08: qty 1

## 2017-01-08 MED ORDER — SENNOSIDES-DOCUSATE SODIUM 8.6-50 MG PO TABS: 1.0000 | ORAL_TABLET | Freq: Every evening | ORAL | Status: DC | PRN

## 2017-01-08 MED ORDER — LISINOPRIL 10 MG PO TABS
40.0000 mg | ORAL_TABLET | Freq: Every day | ORAL | Status: DC
  Filled 2017-01-08: qty 4

## 2017-01-08 MED ORDER — FOLIC ACID 1 MG PO TABS: 1.0000 mg | ORAL_TABLET | Freq: Every day | ORAL | Status: DC

## 2017-01-08 MED ORDER — ASPIRIN 300 MG RE SUPP
300.0000 mg | Freq: Every day | RECTAL | Status: DC
  Administered 2017-01-08: 300 mg via RECTAL
  Filled 2017-01-08: qty 1

## 2017-01-08 MED ORDER — LORAZEPAM 2 MG/ML IJ SOLN: 0.0000 mg | Freq: Two times a day (BID) | INTRAMUSCULAR | Status: DC

## 2017-01-08 MED ORDER — AMLODIPINE BESYLATE 5 MG PO TABS
5.0000 mg | ORAL_TABLET | Freq: Every day | ORAL | Status: DC
  Filled 2017-01-08: qty 1

## 2017-01-08 MED ORDER — MESALAMINE 400 MG PO CPDR
800.0000 mg | DELAYED_RELEASE_CAPSULE | Freq: Two times a day (BID) | ORAL | Status: DC
  Filled 2017-01-08 (×7): qty 2

## 2017-01-08 MED ORDER — ZIPRASIDONE MESYLATE 20 MG IM SOLR
20.0000 mg | Freq: Once | INTRAMUSCULAR | Status: AC
  Administered 2017-01-08: 20 mg via INTRAMUSCULAR
  Filled 2017-01-08: qty 20

## 2017-01-08 MED ORDER — ACETAMINOPHEN 160 MG/5ML PO SOLN: 650.0000 mg | ORAL | Status: DC | PRN

## 2017-01-08 MED ORDER — STROKE: EARLY STAGES OF RECOVERY BOOK
Freq: Once | Status: DC
  Filled 2017-01-08: qty 1

## 2017-01-08 MED ORDER — ATORVASTATIN CALCIUM 40 MG PO TABS
80.0000 mg | ORAL_TABLET | Freq: Every day | ORAL | Status: DC
  Filled 2017-01-08: qty 2

## 2017-01-08 MED ORDER — LORAZEPAM 2 MG/ML IJ SOLN
0.0000 mg | Freq: Four times a day (QID) | INTRAMUSCULAR | Status: DC
  Administered 2017-01-08: 2 mg via INTRAVENOUS
  Filled 2017-01-08: qty 1

## 2017-01-08 MED ORDER — ADULT MULTIVITAMIN W/MINERALS CH: 1.0000 | ORAL_TABLET | Freq: Every day | ORAL | Status: DC

## 2017-01-08 NOTE — Progress Notes (Signed)
Informed by telemonitoring sitter services that patient was pushing his daughter and being combative. Several members of staff including myself responded. MD notified once again and received order to administer Geodon 20 mg. Geodon administered and was able to get patient back to bed. Pt still extremely combative and agitated at this point. Pt's wife and daughter are present and has been informed of patient's escalating behavior. MD is currently speaking with family members. Will continue to monitor patient. Still unable to perform neuro checks and vital signs.

## 2017-01-08 NOTE — ED Provider Notes (Signed)
[facility] DEPT Provider Note   CSN: [id number] Arrival date & time: 01/08/2017  0845     History   Chief Complaint No chief complaint on file.   HPI Dennis Ray is a 76 y.o. male.  HPI  76 y.o. male with a hx of Atrial Flutter, CAD, HTN, HLD, presents to the Emergency Department today due to AMS. Brought in by son. Pt son states that he was acting "funny" this morning around 0200. Noted hearing a fall and proceeded to go into his father's room and noticed him prone on the floor and seeming altered from baseline. LSN around 1800 the night before. Notes no hx CVA. No URI symptoms recently. Noted urinary incontinence throughout the evening with frequent trips to the bathroom. No active pain per patient. Pt recently seen by wound care clinic for BLE swelling. Has leg wraps in place.   Level V Caveat: AMS   Past Medical History:  Diagnosis Date  . Alcohol abuse, in remission   . Atrial flutter (HCC)    Previously not any Coumadin candidate as the result of chronic GI blood loss  . Coronary artery disease    IMI in 09/1999->RCA stent, nl LAD, 30%, distal OM, RCA  95% distal lesion; nuclear in 07/2010-moderate-sized Inferior infarction; normal EF by echo in 07/2010  . GERD (gastroesophageal reflux disease)   . Hyperlipidemia   . Hypertension   . Myocardial infarction   . Sleep apnea    STOP BANG score of 4  . Ulcerative colitis    s/p subtotal colectomy    Patient Active Problem List   Diagnosis Date Noted  . Encounter for therapeutic drug monitoring 10/29/2013  . GERD (gastroesophageal reflux disease)   . Alcohol abuse, in remission   . Chronic anticoagulation 01/29/2011  . Coronary artery disease   . Atrial fibrillation ([place])   . Ulcerative colitis   . Hyperlipidemia 06/02/2008  . Hypertension 06/02/2008    Past Surgical History:  Procedure Laterality Date  . CARDIAC CATHETERIZATION    . CATARACT EXTRACTION W/PHACO  02/26/2012   Procedure: CATARACT EXTRACTION PHACO  AND INTRAOCULAR LENS PLACEMENT (IOC);  Surgeon: Elta Guadeloupe T. Gershon Crane, MD;  Location: [facility];  Service: Ophthalmology;  Laterality: Left;  CDE 8.94  . COLONOSCOPY  08/2010   UNDER FLURO  . COLONOSCOPY  04/02/07   INCOMPLT  . COLONOSCOPY  12/04/06   INCOMP  . COLONOSCOPY  09/25/04   ROURK  . COLONOSCOPY N/A 11/19/2014   Procedure: COLONOSCOPY;  Surgeon: Rogene Houston, MD;  Location: [facility];  Service: Endoscopy;  Laterality: N/A;  815  . CORONARY STENT PLACEMENT    . ESOPHAGOGASTRODUODENOSCOPY N/A 11/19/2014   Procedure: ESOPHAGOGASTRODUODENOSCOPY (EGD);  Surgeon: Rogene Houston, MD;  Location: [facility];  Service: Endoscopy;  Laterality: N/A;  . SUBTOTAL COLECTOMY  2012   secondary to ulcerative colitis and LGI bleeding  . UPPER GASTROINTESTINAL ENDOSCOPY  08/18/2010   NUR       Home Medications    Prior to Admission medications   Medication Sig Start Date End Date Taking? Authorizing Provider  amLODipine (NORVASC) 2.5 MG tablet Take 5 mg by mouth daily.  02/25/12   Yehuda Savannah, MD  atorvastatin (LIPITOR) 80 MG tablet TAKE ONE TABLET BY MOUTH DAILY. 11/28/16   Arnoldo Lenis, MD  cloNIDine (CATAPRES) 0.2 MG tablet Take 0.2 mg by mouth 2 (two) times daily.    Historical Provider, MD  cyclobenzaprine (FLEXERIL) 10 MG tablet Take 1 tablet (10  mg total) by mouth 2 (two) times daily as needed for muscle spasms. 10/28/16   Nat Christen, MD  DELZICOL 400 MG CPDR DR capsule TAKE 2 CAPSULES BY MOUTH TWICE DAILY. 11/04/14   Butch Penny, NP  furosemide (LASIX) 40 MG tablet Take 40 mg by mouth daily.     Historical Provider, MD  HYDROcodone-acetaminophen (NORCO) 7.5-325 MG per tablet Take 1 tablet by mouth 3 (three) times daily as needed for moderate pain.     Historical Provider, MD  lisinopril (PRINIVIL,ZESTRIL) 40 MG tablet Take 40 mg by mouth daily.    Historical Provider, MD  metoprolol (LOPRESSOR) 50 MG tablet Take 50 mg by mouth 2 (two) times daily.      Historical Provider, MD   NITROSTAT 0.4 MG SL tablet PLACE 1 TAB UNDER TONGUE EVERY 5 MIN IF NEEDED FOR CHEST PAIN. MAY USE 3 TIMES.NO RELIEF CALL 911. 05/09/15   Arnoldo Lenis, MD  oxyCODONE-acetaminophen (PERCOCET) 5-325 MG tablet Take 1-2 tablets by mouth every 4 (four) hours as needed. 10/28/16   Nat Christen, MD  potassium chloride SA (K-DUR,KLOR-CON) 20 MEQ tablet Take 20 mEq by mouth daily.      Historical Provider, MD  terazosin (HYTRIN) 5 MG capsule TAKE 2 CAPSULES BY MOUTH ONCE DAILY. Patient taking differently: 5 mg. TAKE 1 CAPSULES BY MOUTH ONCE DAILY. 07/02/13   Lendon Colonel, NP  warfarin (COUMADIN) 2 MG tablet TAKE 1 TABLET BY MOUTH ONCE DAILY EXCEPT 2 TABLETS ON [facility] AND SATURDAYS. 12/26/16   Arnoldo Lenis, MD  zolpidem (AMBIEN) 10 MG tablet Take 10 mg by mouth at bedtime.  07/05/14   Historical Provider, MD    Family History Family History  Problem Relation Age of Onset  . Healthy Sister   . Irritable bowel syndrome Daughter   . Anesthesia problems Neg Hx   . Malignant hyperthermia Neg Hx   . Pseudochol deficiency Neg Hx     Social History Social History  Substance Use Topics  . Smoking status: Never Smoker  . Smokeless tobacco: Never Used  . Alcohol use 0.0 oz/week     Comment: 12oz beer 1-2 times weekly     Allergies   Doxycycline   Review of Systems Review of Systems ROS reviewed and all are negative for acute change except as noted in the HPI.  Physical Exam Updated Vital Signs BP (!) 178/88 (BP Location: Left Arm)   Pulse 82   Temp 98.5 F (36.9 C) (Oral)   Resp 20   Ht 6\' 5"  (1.956 m)   Wt 136.1 kg   SpO2 98%   BMI 35.57 kg/m   Physical Exam  Constitutional: Vital signs are normal. He appears well-developed and well-nourished. No distress.  HENT:  Head: Normocephalic and atraumatic.  Right Ear: Hearing, tympanic membrane, external ear and ear canal normal.  Left Ear: Hearing, tympanic membrane, external ear and ear canal normal.  Nose: Nose normal.   Mouth/Throat: Uvula is midline, oropharynx is clear and moist and mucous membranes are normal. No trismus in the jaw. No oropharyngeal exudate, posterior oropharyngeal erythema or tonsillar abscesses.  Eyes: Conjunctivae and EOM are normal. Pupils are equal, round, and reactive to light.  Neck: Normal range of motion. Neck supple. No tracheal deviation present.  Cardiovascular: Normal rate, regular rhythm, S1 normal, S2 normal, normal heart sounds, intact distal pulses and normal pulses.   Pulmonary/Chest: Effort normal and breath sounds normal. No respiratory distress. He has no decreased breath sounds. He has  no wheezes. He has no rhonchi. He has no rales.  Abdominal: Normal appearance and bowel sounds are normal. There is no tenderness. There is no rigidity, no rebound, no guarding, no CVA tenderness, no tenderness at McBurney's point and negative Murphy's sign.  Musculoskeletal: Normal range of motion.  BLE wraps noted. Uncovered and showed mild swelling that is resolving. No erythema or purulence. NVI. Distal pulses appreciated.   Neurological: He is alert. He has normal strength. He is disoriented (disoriented to time). No cranial nerve deficit or sensory deficit.  Cranial Nerves:  II: Pupils equal, round, reactive to light III,IV, VI: ptosis not present, extra-ocular motions intact bilaterally  V,VII: smile symmetric, facial light touch sensation equal VIII: hearing grossly normal bilaterally  IX,X: midline uvula rise  XI: bilateral shoulder shrug equal and strong XII: midline tongue extension Negative pronator drift  Skin: Skin is warm and dry.  Psychiatric: He has a normal mood and affect. His speech is normal and behavior is normal. Thought content normal.  Nursing note and vitals reviewed.  ED Treatments / Results  Labs (all labs ordered are listed, but only abnormal results are displayed) Labs Reviewed  COMPREHENSIVE METABOLIC PANEL - Abnormal; Notable for the following:        Result Value   Potassium 3.1 (*)    Chloride 100 (*)    Glucose, Bld 118 (*)    Albumin 3.4 (*)    ALT 15 (*)    All other components within normal limits  CBC - Abnormal; Notable for the following:    Hemoglobin 11.4 (*)    HCT 37.5 (*)    MCH 24.6 (*)    RDW 16.5 (*)    All other components within normal limits  URINALYSIS, ROUTINE W REFLEX MICROSCOPIC - Abnormal; Notable for the following:    Color, Urine STRAW (*)    Hgb urine dipstick SMALL (*)    Squamous Epithelial / LPF 0-5 (*)    All other components within normal limits  CBG MONITORING, ED - Abnormal; Notable for the following:    Glucose-Capillary 125 (*)    All other components within normal limits  I-STAT TROPOININ, ED  I-STAT CG4 LACTIC ACID, ED  I-STAT TROPOININ, ED  I-STAT CG4 LACTIC ACID, ED    EKG  EKG Interpretation  Date/Time:  Tuesday January 08 2017 09:07:47 EDT Ventricular Rate:  80 PR Interval:    QRS Duration: 123 QT Interval:  388 QTC Calculation: 448 R Axis:   25 Text Interpretation:  Atrial fibrillation Ventricular premature complex Left bundle branch block Similar to prior. No STEMI.  Confirmed by LONG MD, JOSHUA [phone]) on 01/07/2017 9:43:35 AM      Radiology Ct Head Wo Contrast  Result Date: 12/30/2016 CLINICAL DATA:  Altered mental status EXAM: CT HEAD WITHOUT CONTRAST TECHNIQUE: Contiguous axial images were obtained from the base of the skull through the vertex without intravenous contrast. COMPARISON:  11/16/2014 FINDINGS: Brain: Chronic ischemic changes are present in the periventricular white matter. Mild global atrophy compared with age. No mass effect, midline shift, or acute hemorrhage. Vascular: No hyperdense vessel or unexpected calcification. Skull: Cranium is intact. Sinuses/Orbits: Small mucous retention cyst in the left maxillary sinus. Mastoid air cells are clear. Orbits are unremarkable. Other: None. IMPRESSION: No acute intracranial pathology. Electronically Signed   By:  Marybelle Killings M.D.   On: 12/25/2016 10:05   Dg Chest Portable 1 View  Result Date: 01/03/2017 CLINICAL DATA:  Altered mental status EXAM: PORTABLE CHEST 1  VIEW COMPARISON:  11/16/2014 FINDINGS: Cardiac shadow remains enlarged. The aorta is tortuous. No acute infiltrate or sizable effusion is noted. No bony abnormality is seen. IMPRESSION: No active disease. Electronically Signed   By: Inez Catalina M.D.   On: 12/27/2016 09:22    Procedures Procedures (including critical care time)  Medications Ordered in ED Medications - No data to display   Initial Impression / Assessment and Plan / ED Course  I have reviewed the triage vital signs and the nursing notes.  Pertinent labs & imaging results that were available during my care of the patient were reviewed by me and considered in my medical decision making (see chart for details).  Final Clinical Impressions(s) / ED Diagnoses  {I have reviewed and evaluated the relevant laboratory values. {I have reviewed and evaluated the relevant imaging studies. {I have interpreted the relevant EKG. {I have reviewed the relevant previous healthcare records.  {I obtained HPI from historian. {Patient discussed with supervising physician.  ED Course:  Assessment: Pt is a 76 y.o. male with hx Atrial Flutter, CAD, HTN, HLD who presents with son due to AMS this AM. LSN 1800 last night. Noted disorientation from time. No URI symptoms. Noted urinary incontinence throughout the evening. No fevers. On exam, pt in NAD. Nontoxic/nonseptic appearing. VSS. Afebrile. Lungs CTA. Heart RRR. Abdomen nontender soft. CN evaluated and unremarkable. Pt altered to time. Noted BLE leg wraps that revealed no signs of infection. iStat Lactic 1.57. CBC unremarkable. CMP with mild hypokalemia.  CXR unremarkable. CT Head unremarkable. Discussed and seen by supervising physician. Possible TIA? Plan is to Admit to medicine.   Disposition/Plan:  Admit Pt acknowledges and agrees with  plan  Supervising Physician Margette Fast, MD  Final diagnoses:  Altered mental status, unspecified altered mental status type  Hypokalemia    New Prescriptions New Prescriptions   No medications on file     Shary Decamp, PA-C 12/28/2016 [place], MD 01/03/2017 1919

## 2017-01-08 NOTE — H&P (Signed)
History and Physical    Nat Lowenthal FXT:[id number] DOB: June 14, 1941 DOA: 12/18/2016  PCP: Asencion Noble, MD  Patient coming from: home  I have personally briefly reviewed patient's old medical records in [place]  Chief Complaint: altered mental status  HPI: Dennis Ray is a 76 y.o. male with medical history significant of atrial flutter on anticoagulation, hypertension, ulcerative colitis, coronary artery disease. Patient lives at home with his son. He is brought to the hospital today with altered mental status. According to notes, patient's son heard the patient wake up earlier this morning at 2 AM. The patient's son heard a "thud" and went to go check on the patient who had fallen. It was noted that patient was somewhat confused and disoriented. Prior to this morning, patient was in his usual state of health. He is on multiple medications and doses his medicines himself. He has not had any fever or cough. History is somewhat limited due to patient's disorientation.  ED Course: On arrival to the emergency room, patient was noted to have stable vitals. Workup including CT scan of the head, chest x-ray, urinalysis, basic labs were unremarkable. Since he remained confused, he's been referred for admission.  Review of Systems: unable to assess due to mental status  Past Medical History:  Diagnosis Date  . Alcohol abuse, in remission   . Atrial flutter (HCC)    Previously not any Coumadin candidate as the result of chronic GI blood loss  . Coronary artery disease    IMI in 09/1999->RCA stent, nl LAD, 30%, distal OM, RCA  95% distal lesion; nuclear in 07/2010-moderate-sized Inferior infarction; normal EF by echo in 07/2010  . GERD (gastroesophageal reflux disease)   . Hyperlipidemia   . Hypertension   . Myocardial infarction ([place])   . Sleep apnea    STOP BANG score of 4  . Ulcerative colitis    s/p subtotal colectomy    Past Surgical History:  Procedure Laterality Date  . CARDIAC  CATHETERIZATION    . CATARACT EXTRACTION W/PHACO  02/26/2012   Procedure: CATARACT EXTRACTION PHACO AND INTRAOCULAR LENS PLACEMENT (IOC);  Surgeon: Elta Guadeloupe T. Gershon Crane, MD;  Location: [facility];  Service: Ophthalmology;  Laterality: Left;  CDE 8.94  . COLONOSCOPY  08/2010   UNDER FLURO  . COLONOSCOPY  04/02/07   INCOMPLT  . COLONOSCOPY  12/04/06   INCOMP  . COLONOSCOPY  09/25/04   ROURK  . COLONOSCOPY N/A 11/19/2014   Procedure: COLONOSCOPY;  Surgeon: Rogene Houston, MD;  Location: [facility];  Service: Endoscopy;  Laterality: N/A;  815  . CORONARY STENT PLACEMENT    . ESOPHAGOGASTRODUODENOSCOPY N/A 11/19/2014   Procedure: ESOPHAGOGASTRODUODENOSCOPY (EGD);  Surgeon: Rogene Houston, MD;  Location: [facility];  Service: Endoscopy;  Laterality: N/A;  . SUBTOTAL COLECTOMY  2012   secondary to ulcerative colitis and LGI bleeding  . UPPER GASTROINTESTINAL ENDOSCOPY  08/18/2010   NUR     reports that he has never smoked. He has never used smokeless tobacco. He reports that he drinks alcohol. He reports that he does not use drugs.  Allergies  Allergen Reactions  . Doxycycline     THROAT SWELLS    Family History  Problem Relation Age of Onset  . Healthy Sister   . Irritable bowel syndrome Daughter   . Anesthesia problems Neg Hx   . Malignant hyperthermia Neg Hx   . Pseudochol deficiency Neg Hx      Prior to Admission medications   Medication Sig  Start Date End Date Taking? Authorizing Provider  amLODipine (NORVASC) 2.5 MG tablet Take 5 mg by mouth daily.  02/25/12   Yehuda Savannah, MD  atorvastatin (LIPITOR) 80 MG tablet TAKE ONE TABLET BY MOUTH DAILY. 11/28/16   Arnoldo Lenis, MD  cloNIDine (CATAPRES) 0.2 MG tablet Take 0.2 mg by mouth 2 (two) times daily.    Historical Provider, MD  cyclobenzaprine (FLEXERIL) 10 MG tablet Take 1 tablet (10 mg total) by mouth 2 (two) times daily as needed for muscle spasms. 10/28/16   Nat Christen, MD  DELZICOL 400 MG CPDR DR capsule TAKE 2  CAPSULES BY MOUTH TWICE DAILY. 11/04/14   Butch Penny, NP  furosemide (LASIX) 40 MG tablet Take 40 mg by mouth daily.     Historical Provider, MD  HYDROcodone-acetaminophen (NORCO) 7.5-325 MG per tablet Take 1 tablet by mouth 3 (three) times daily as needed for moderate pain.     Historical Provider, MD  lisinopril (PRINIVIL,ZESTRIL) 40 MG tablet Take 40 mg by mouth daily.    Historical Provider, MD  metoprolol (LOPRESSOR) 50 MG tablet Take 50 mg by mouth 2 (two) times daily.      Historical Provider, MD  NITROSTAT 0.4 MG SL tablet PLACE 1 TAB UNDER TONGUE EVERY 5 MIN IF NEEDED FOR CHEST PAIN. MAY USE 3 TIMES.NO RELIEF CALL 911. 05/09/15   Arnoldo Lenis, MD  oxyCODONE-acetaminophen (PERCOCET) 5-325 MG tablet Take 1-2 tablets by mouth every 4 (four) hours as needed. 10/28/16   Nat Christen, MD  potassium chloride SA (K-DUR,KLOR-CON) 20 MEQ tablet Take 20 mEq by mouth daily.      Historical Provider, MD  terazosin (HYTRIN) 5 MG capsule TAKE 2 CAPSULES BY MOUTH ONCE DAILY. Patient taking differently: 5 mg. TAKE 1 CAPSULES BY MOUTH ONCE DAILY. 07/02/13   Lendon Colonel, NP  warfarin (COUMADIN) 2 MG tablet TAKE 1 TABLET BY MOUTH ONCE DAILY EXCEPT 2 TABLETS ON [facility] AND SATURDAYS. 12/26/16   Arnoldo Lenis, MD  zolpidem (AMBIEN) 10 MG tablet Take 10 mg by mouth at bedtime.  07/05/14   Historical Provider, MD    Physical Exam: Vitals:   12/28/2016 1133 12/21/2016 1134 12/30/2016 1230 01/07/2017 1300  BP: (!) 181/98  (!) 185/131 120/88  Pulse:   79 (!) 102  Resp:  15 16 17   Temp:   98.1 F (36.7 C) 97.9 F (36.6 C)  TempSrc:   Oral Oral  SpO2: 98%  97% 96%  Weight:   103.4 kg (228 lb) 130.9 kg (288 lb 9.3 oz)  Height:   6\' 5"  (1.956 m)     Constitutional: agitated, confused, combative Vitals:   12/25/2016 1133 12/29/2016 1134 12/25/2016 1230 12/19/2016 1300  BP: (!) 181/98  (!) 185/131 120/88  Pulse:   79 (!) 102  Resp:  15 16 17   Temp:   98.1 F (36.7 C) 97.9 F (36.6 C)  TempSrc:   Oral  Oral  SpO2: 98%  97% 96%  Weight:   103.4 kg (228 lb) 130.9 kg (288 lb 9.3 oz)  Height:   6\' 5"  (1.956 m)    Eyes: PERRL, lids and conjunctivae normal ENMT: Mucous membranes are moist. Posterior pharynx clear of any exudate or lesions.Normal dentition.  Neck: normal, supple, no masses, no thyromegaly Respiratory: clear to auscultation bilaterally, no wheezing, no crackles. Normal respiratory effort. No accessory muscle use.  Cardiovascular: irregular, no murmurs / rubs / gallops. Mild edema in LE bilaterally. 2+ pedal pulses. No carotid bruits.  Abdomen: no tenderness, no masses palpated. No hepatosplenomegaly. Bowel sounds positive.  Musculoskeletal: no clubbing / cyanosis. No joint deformity upper and lower extremities. Good ROM, no contractures. Normal muscle tone.  Skin: no rashes, lesions, ulcers. No induration Neurologic: CN 2-12 grossly intact. Sensation intact, DTR normal. Strength 5/5 in all 4.  Psychiatric: disoriented, agitated    Labs on Admission: I have personally reviewed following labs and imaging studies  CBC:  Recent Labs Lab 01/11/2017 0925  WBC 7.9  HGB 11.4*  HCT 37.5*  MCV 80.8  PLT 024   Basic Metabolic Panel:  Recent Labs Lab 12/26/2016 0925  NA 140  K 3.1*  CL 100*  CO2 31  GLUCOSE 118*  BUN 11  CREATININE 0.75  CALCIUM 8.9   GFR: Estimated Creatinine Clearance: 117.6 mL/min (by C-G formula based on SCr of 0.75 mg/dL). Liver Function Tests:  Recent Labs Lab 12/19/2016 0925  AST 22  ALT 15*  ALKPHOS 65  BILITOT 0.4  PROT 6.9  ALBUMIN 3.4*   No results for input(s): LIPASE, AMYLASE in the last 168 hours.  Recent Labs Lab 01/06/2017 1520  AMMONIA 12   Coagulation Profile: No results for input(s): INR, PROTIME in the last 168 hours. Cardiac Enzymes: No results for input(s): CKTOTAL, CKMB, CKMBINDEX, TROPONINI in the last 168 hours. BNP (last 3 results) No results for input(s): PROBNP in the last 8760 hours. HbA1C: No results for  input(s): HGBA1C in the last 72 hours. CBG:  Recent Labs Lab 12/30/2016 0901  GLUCAP 125*   Lipid Profile: No results for input(s): CHOL, HDL, LDLCALC, TRIG, CHOLHDL, LDLDIRECT in the last 72 hours. Thyroid Function Tests:  Recent Labs  12/16/2016 1520  TSH 1.262   Anemia Panel: No results for input(s): VITAMINB12, FOLATE, FERRITIN, TIBC, IRON, RETICCTPCT in the last 72 hours. Urine analysis:    Component Value Date/Time   COLORURINE STRAW (A) 01/07/2017 0906   APPEARANCEUR CLEAR 12/22/2016 0906   LABSPEC 1.008 12/19/2016 0906   PHURINE 7.0 12/17/2016 0906   GLUCOSEU NEGATIVE 01/02/2017 0906   HGBUR SMALL (A) 12/20/2016 0906   BILIRUBINUR NEGATIVE 12/18/2016 0906   KETONESUR NEGATIVE 12/21/2016 0906   PROTEINUR NEGATIVE 12/31/2016 0906   NITRITE NEGATIVE 12/21/2016 0906   LEUKOCYTESUR NEGATIVE 01/06/2017 0906    Radiological Exams on Admission: Ct Head Wo Contrast  Result Date: 01/10/2017 CLINICAL DATA:  Altered mental status EXAM: CT HEAD WITHOUT CONTRAST TECHNIQUE: Contiguous axial images were obtained from the base of the skull through the vertex without intravenous contrast. COMPARISON:  11/16/2014 FINDINGS: Brain: Chronic ischemic changes are present in the periventricular white matter. Mild global atrophy compared with age. No mass effect, midline shift, or acute hemorrhage. Vascular: No hyperdense vessel or unexpected calcification. Skull: Cranium is intact. Sinuses/Orbits: Small mucous retention cyst in the left maxillary sinus. Mastoid air cells are clear. Orbits are unremarkable. Other: None. IMPRESSION: No acute intracranial pathology. Electronically Signed   By: Marybelle Killings M.D.   On: 12/29/2016 10:05   Dg Chest Portable 1 View  Result Date: 01/13/2017 CLINICAL DATA:  Altered mental status EXAM: PORTABLE CHEST 1 VIEW COMPARISON:  11/16/2014 FINDINGS: Cardiac shadow remains enlarged. The aorta is tortuous. No acute infiltrate or sizable effusion is noted. No bony  abnormality is seen. IMPRESSION: No active disease. Electronically Signed   By: Inez Catalina M.D.   On: 01/07/2017 09:22    EKG: Independently reviewed. A fib with LBBB  Assessment/Plan Active Problems:   Hyperlipidemia   Hypertension   Atrial  fibrillation (HCC)   Chronic anticoagulation   GERD (gastroesophageal reflux disease)   Altered mental status   Delirium    1. Altered mental status/delirium. Etiology is not entirely clear. There is a history of alcohol abuse, but it is unclear as to how long ago the patient started drinking. Family is unable to me when the patient's last drink was. patient is increasingly agitated requiring a sitter and restraints. He received 1 dose of Geodon will start him on CIWA protocol with Ativan. He does have a mild tremor. Will check ammonia. Other etiologies could be polypharmacy. There does not appear to be any signs of infection at this time. MRI of the brain has been ordered, but the patient will have to be more cooperative to undergo this test. Neurology consult.  2. Atrial fibrillation. Currently rate controlled. Continue metoprolol twice a day. Continue anticoagulation per pharmacy.  3. Hypertension. Patient is on multiple antihypertensives. Continue current regimen. Blood pressure is currently stable.  4. Hyperlipidemia. Continue statin  DVT prophylaxis: lovenox Code Status: full code Family Communication: discussed with daughter at the bedside Disposition Plan: discharge home once improved Consults called: neurology Admission status: inpatient, tele   Maedell Hedger MD [facility] Pager [phone]  If 7PM-7AM, please contact night-coverage [email or web address] Password [facility]  01/13/2017, 5:06 PM

## 2017-01-08 NOTE — Progress Notes (Addendum)
Unable to perform neuro checks at the 2030 time, [name] were able to obtain vital signs, BP 131/110. Unable to give scheduled meds at this time, pt not following any commands, only fidgets hands and legs and moans occasionally. Dr. Legrand Rams aware, will continue to monitor.

## 2017-01-08 NOTE — Progress Notes (Signed)
Scheduled catapres given sublingual via syringe, all other PO meds held at this time, Dr. Legrand Rams aware. Unable to complete neuro checks at this time, pt unresponsive to verbal communication and not following any commands, will continue to monitor. NT at bedside as Air cabin crew.

## 2017-01-08 NOTE — Progress Notes (Signed)
Unable to perform neuro checks and vital signs at this time. Pt very uncooperative and combative. Pt placed in non-violent soft restraints as ordered by MD. Pt has been offered food, drink and toileting. Pt's bed linen has been changed. Pt's airway open and patent. Sitter is at bedside with patient and wife is currently in the room. Unable to administer medications to patient at this time. Will continue to assess  patient.

## 2017-01-08 NOTE — ED Notes (Signed)
Pt still taking leads, pulse ox off. mildy restless

## 2017-01-08 NOTE — Progress Notes (Signed)
Unable to perform Geriatric Depression Screen. Pt not cooperative and patient's wife is unable to answer questions related to patient.

## 2017-01-08 NOTE — ED Triage Notes (Signed)
Pt comes in with family (pt not at bedside at present). Pt is alert but altered. Pt thinks the year is 74. Pt has abrasion to left knee.

## 2017-01-08 NOTE — Progress Notes (Signed)
Unable to perform neuro checks and vital signs at this time. Pt very uncooperative and combative. Pt placed in non-violent soft restraints as ordered by MD. Pt has been offered food, drink and toileting. Pt's bed linen has been changed. Pt's airway open and patent. Sitter is at bedside with patient and wife and son are currently in the room. Will continue to assess  patient.

## 2017-01-08 NOTE — Progress Notes (Signed)
Pt extremely agitated, restless and combative. Pt threatening to hit staff members. MD E-Paged and received order for Ativan 1 mg. Ativan administered to patient. Patient's family in the room with patient and unable to redirect patient. Unable to perform neuro checks or vital signs at this time. Pt very uncooperative. Will attempt at a later time.

## 2017-01-08 NOTE — ED Notes (Signed)
Pt returned from ct. nad. No changes. Family in room states pt is much better than when found him this am

## 2017-01-08 NOTE — Progress Notes (Signed)
ANTICOAGULATION CONSULT NOTE - Initial Consult  Pharmacy Consult for coumadin Indication: atrial fibrillation  Allergies  Allergen Reactions  . Doxycycline     THROAT SWELLS    Patient Measurements: Height: 6\' 5"  (195.6 cm) Weight: 288 lb 9.3 oz (130.9 kg) IBW/kg (Calculated) : 89.1   Vital Signs: Temp: 97.9 F (36.6 C) (04/24 1300) Temp Source: Oral (04/24 1300) BP: 120/88 (04/24 1300) Pulse Rate: 100 (04/24 1700)  Labs:  Recent Labs  01/02/2017 0925 12/19/2016 1729  HGB 11.4*  --   HCT 37.5*  --   PLT 233  --   LABPROT  --  31.2*  INR  --  2.93  CREATININE 0.75  --     Estimated Creatinine Clearance: 117.6 mL/min (by C-G formula based on SCr of 0.75 mg/dL).   Medical History: Past Medical History:  Diagnosis Date  . Alcohol abuse, in remission   . Atrial flutter (HCC)    Previously not any Coumadin candidate as the result of chronic GI blood loss  . Coronary artery disease    IMI in 09/1999->RCA stent, nl LAD, 30%, distal OM, RCA  95% distal lesion; nuclear in 07/2010-moderate-sized Inferior infarction; normal EF by echo in 07/2010  . GERD (gastroesophageal reflux disease)   . Hyperlipidemia   . Hypertension   . Myocardial infarction ([place])   . Sleep apnea    STOP BANG score of 4  . Ulcerative colitis    s/p subtotal colectomy    Medications:  Prescriptions Prior to Admission  Medication Sig Dispense Refill Last Dose  . amLODipine (NORVASC) 2.5 MG tablet Take 5 mg by mouth daily.    Past Week at Unknown time  . atorvastatin (LIPITOR) 80 MG tablet TAKE ONE TABLET BY MOUTH DAILY. 30 tablet 0   . cloNIDine (CATAPRES) 0.2 MG tablet Take 0.2 mg by mouth 2 (two) times daily.   Past Week at Unknown time  . cyclobenzaprine (FLEXERIL) 10 MG tablet Take 1 tablet (10 mg total) by mouth 2 (two) times daily as needed for muscle spasms. 20 tablet 0   . DELZICOL 400 MG CPDR DR capsule TAKE 2 CAPSULES BY MOUTH TWICE DAILY. 120 capsule 6 Past Week at Unknown time  .  furosemide (LASIX) 40 MG tablet Take 40 mg by mouth daily.    Past Week at Unknown time  . HYDROcodone-acetaminophen (NORCO) 7.5-325 MG per tablet Take 1 tablet by mouth 3 (three) times daily as needed for moderate pain.    Past Week at Unknown time  . lisinopril (PRINIVIL,ZESTRIL) 40 MG tablet Take 40 mg by mouth daily.   Past Week at Unknown time  . metoprolol (LOPRESSOR) 50 MG tablet Take 50 mg by mouth 2 (two) times daily.     Past Week at Unknown time  . NITROSTAT 0.4 MG SL tablet PLACE 1 TAB UNDER TONGUE EVERY 5 MIN IF NEEDED FOR CHEST PAIN. MAY USE 3 TIMES.NO RELIEF CALL 911. 25 tablet 4 unknown  . oxyCODONE-acetaminophen (PERCOCET) 5-325 MG tablet Take 1-2 tablets by mouth every 4 (four) hours as needed. 20 tablet 0   . potassium chloride SA (K-DUR,KLOR-CON) 20 MEQ tablet Take 20 mEq by mouth daily.     Past Week at Unknown time  . terazosin (HYTRIN) 5 MG capsule TAKE 2 CAPSULES BY MOUTH ONCE DAILY. (Patient taking differently: 5 mg. TAKE 1 CAPSULES BY MOUTH ONCE DAILY.) 60 capsule 6 Past Week at Unknown time  . warfarin (COUMADIN) 2 MG tablet TAKE 1 TABLET BY MOUTH ONCE  DAILY EXCEPT 2 TABLETS ON WEDNESDAYS AND SATURDAYS. 40 tablet 0   . zolpidem (AMBIEN) 10 MG tablet Take 10 mg by mouth at bedtime.    Past Week at Unknown time    Assessment: 76 yo man admitted with AMS to continue coumadin for afib.  INR 2.93 Goal of Therapy:  INR 2-3 Monitor platelets by anticoagulation protocol: Yes   Plan:  No coumadin today Check daily PT/INR Monitor for bleeding complications  Excell Seltzer Poteet 12/23/2016,6:40 PM

## 2017-01-09 ENCOUNTER — Inpatient Hospital Stay (HOSPITAL_COMMUNITY): Payer: Medicare Other

## 2017-01-09 LAB — LIPID PANEL
CHOL/HDL RATIO: 3.6 ratio
Cholesterol: 90 mg/dL (ref 0–200)
HDL: 25 mg/dL — ABNORMAL LOW (ref >40)
LDL Cholesterol: 48 mg/dL (ref 0–99)
Triglycerides: 84 mg/dL (ref <150)
VLDL: 17 mg/dL (ref 0–40)

## 2017-01-09 LAB — PROTIME-INR
INR: 2.62
PROTHROMBIN TIME: 28.5 s — AB (ref 11.4–15.2)

## 2017-01-09 MED ORDER — WARFARIN - PHARMACIST DOSING INPATIENT: Freq: Every day | Status: DC

## 2017-01-09 MED ORDER — MORPHINE SULFATE (PF) 2 MG/ML IV SOLN: 2.0000 mg | INTRAVENOUS | Status: DC | PRN

## 2017-01-09 MED ORDER — WARFARIN SODIUM 2 MG PO TABS: 4.0000 mg | ORAL_TABLET | Freq: Once | ORAL | Status: DC

## 2017-01-10 LAB — HEMOGLOBIN A1C
Hgb A1c MFr Bld: 6.4 % — ABNORMAL HIGH (ref 4.8–5.6)
Mean Plasma Glucose: 137 mg/dL

## 2017-01-15 NOTE — Discharge Summary (Signed)
Physician Discharge Summary  Dennis Ray HYI:[id number] DOB: [birth year] DOA: 01/05/2017   Admit date: 01/06/2017 Discharge date: 01/11/2017  Discharge Diagnoses:  Active Problems:   Hyperlipidemia   Hypertension   Atrial fibrillation (HCC)   Chronic anticoagulation   GERD (gastroesophageal reflux disease)   Altered mental status   Delirium    Wt Readings from Last 3 Encounters:  02/07/17 286 lb 9.6 oz (130 kg)  10/28/16 300 lb (136.1 kg)  06/27/16 (!) 313 lb 6.4 oz (142.2 kg)     Hospital Course:  This patient was a 76 year old male who presented to the hospital after developing altered mental status and falling at home. CT scan of the brain revealed no acute abnormality. There was no sign of fever or metabolic abnormality. He required sedation with Ativan and Geodon. He was evaluated further with an MRI of the brain which revealed subdural hematoma and bleeding in the left temporal region. This was reviewed with neurosurgery. Comfort care was recommended. The patient remained unresponsive and subsequently expired on the afternoon of April 25. Findings were reviewed with his family.   Discharge Instructions   Allergies as of 02/07/2017      Reactions   Doxycycline    THROAT SWELLS      Medication List    ASK your doctor about these medications   atorvastatin 80 MG tablet Commonly known as:  LIPITOR TAKE ONE TABLET BY MOUTH DAILY.   cloNIDine 0.2 MG tablet Commonly known as:  CATAPRES Take 0.2 mg by mouth 2 (two) times daily.   cyclobenzaprine 10 MG tablet Commonly known as:  FLEXERIL Take 1 tablet (10 mg total) by mouth 2 (two) times daily as needed for muscle spasms.   DELZICOL 400 MG Cpdr DR capsule Generic drug:  Mesalamine TAKE 2 CAPSULES BY MOUTH TWICE DAILY.   furosemide 40 MG tablet Commonly known as:  LASIX Take 40 mg by mouth daily.   HYDROcodone-acetaminophen 7.5-325 MG tablet Commonly known as:  NORCO Take 1 tablet by mouth 3 (three) times daily  as needed for moderate pain.   lisinopril 40 MG tablet Commonly known as:  PRINIVIL,ZESTRIL Take 40 mg by mouth daily.   metoprolol 50 MG tablet Commonly known as:  LOPRESSOR Take 50 mg by mouth 2 (two) times daily.   NITROSTAT 0.4 MG SL tablet Generic drug:  nitroGLYCERIN PLACE 1 TAB UNDER TONGUE EVERY 5 MIN IF NEEDED FOR CHEST PAIN. MAY USE 3 TIMES.NO RELIEF CALL 911.   NORVASC 2.5 MG tablet Generic drug:  amLODipine Take 5 mg by mouth daily.   oxyCODONE-acetaminophen 5-325 MG tablet Commonly known as:  PERCOCET Take 1-2 tablets by mouth every 4 (four) hours as needed.   potassium chloride SA 20 MEQ tablet Commonly known as:  K-DUR,KLOR-CON Take 20 mEq by mouth daily.   terazosin 5 MG capsule Commonly known as:  HYTRIN TAKE 2 CAPSULES BY MOUTH ONCE DAILY.   warfarin 2 MG tablet Commonly known as:  COUMADIN TAKE 1 TABLET BY MOUTH ONCE DAILY EXCEPT 2 TABLETS ON WEDNESDAYS AND SATURDAYS.   zolpidem 10 MG tablet Commonly known as:  AMBIEN Take 10 mg by mouth at bedtime.        Dennis Ray 01/11/2017

## 2017-01-15 NOTE — Progress Notes (Signed)
Patient ID: Dennis Ray, male   DOB: [birth year], 76 y.o.   MRN: [id number] I was called to look at the scan on this 26 -year-old male on oral anticoagulants with an INR greater than 2 who was admitted yesterday with altered mental status. CT scan was read as negative. MRI today shows a large left-sided subdural hematoma with a large left temporal intracerebral hemorrhage with severe mass effect on the left hemisphere, L uncal herniation, a small right-sided subdural hematoma, and what appears to be on diffusion-weighted images developing infarcts in all 3 vascular territories of the left hemisphere. This is likely not a survivable injury, and if he were to survive he would likely be neurologically devastated with hemiplegia and aphasia. I would not recommend aggressive care but would recommend comfort care.

## 2017-01-15 NOTE — Final Progress Note (Signed)
Late Entry   Discontinued patients soft restratints so that he may go doen to have an MRI done.  Reported by radiology staff that the patient had a massive head bleed and that DR. Willey Blade is to be instructed to notify the radiologist immediately.  Notified Dr. Willey Blade and gave him the nu,ber for the rad.  MD discussed plan of care with me and the family. Orders given and follwed.  1347 notified Dr. Willey Blade that patient had died.  Verification was done with Sharyn Blitz.  MD states he his talk to family and sign the death certificate.  Patient was verified to be deceased AEB no pulse, active breathing.

## 2017-01-15 NOTE — Progress Notes (Addendum)
OT Cancellation Note  Patient Details Name: Dennis Ray MRN: [id number] DOB: 10/28/1940   Cancelled Treatment:    Reason Eval/Treat Not Completed: Medical issues which prohibited therapy (76 -year-old male on oral anticoagulants with an INR greater than 2 who was admitted yesterday with altered mental status. CT scan was read as negative. MRI today shows a large left-sided subdural hematoma with a large left temporal intracerebral hemorrhage with severe mass effect on the left hemisphere, L uncalherniation,a small right-sided subdural hematoma, and what appears to be on diffusion-weighted images developing infarcts in all 3 vascular territories of the left hemisphere.  Per neurology - this is likely not a survivable injury, and if he were to survive he would likely be neurologically devastated with hemiplegia and aphasia. I would not recommend aggressive care but would recommend comfort care.) OT will sign off at this time.   Ailene Ravel, OTR/L,CBIS  [phone]  02/07/17, 9:41 AM

## 2017-01-15 NOTE — Progress Notes (Signed)
PT Cancellation Note  Patient Details Name: Dennis Ray MRN: [id number] DOB: 1941/07/17   Cancelled Treatment:    Reason Eval/Treat Not Completed: Other (comment);Medical issues which prohibited therapy (76 -year-old male on oral anticoagulants with an INR greater than 2 who was admitted yesterday with altered mental status. CT scan was read as negative. MRI today shows a large left-sided subdural hematoma with a large left temporal intracerebral hemorrhage with severe mass effect on the left hemisphere, L uncal herniation, a small right-sided subdural hematoma, and what appears to be on diffusion-weighted images developing infarcts in all 3 vascular territories of the left hemisphere.  Per neurology - this is likely not a survivable injury, and if he were to survive he would likely be neurologically devastated with hemiplegia and aphasia. I would not recommend aggressive care but would recommend comfort care.)  PT will sign off at this time.   Beth Becker Christopher, PT, DPT X: [phone]

## 2017-01-15 NOTE — Progress Notes (Signed)
Unable to perform accurate CIWA score, pt non verbal and lethargic at this time. When performing range of motion exercises pt tenses up. Safety sitter at bedside, will continue to monitor.

## 2017-01-15 NOTE — Progress Notes (Signed)
[place] for coumadin Indication: atrial fibrillation  Allergies  Allergen Reactions  . Doxycycline     THROAT SWELLS    Patient Measurements: Height: 6\' 5"  (195.6 cm) Weight: 286 lb 9.6 oz (130 kg) IBW/kg (Calculated) : 89.1   Vital Signs: Temp: 98.5 F (36.9 C) (04/25 0830) Temp Source: Axillary (04/25 0430) BP: 143/54 (04/25 0830) Pulse Rate: 67 (04/25 0830)  Labs:  Recent Labs  12/17/2016 0925 01/01/2017 1729 2017/01/22 0453  HGB 11.4*  --   --   HCT 37.5*  --   --   PLT 233  --   --   LABPROT  --  31.2* 28.5*  INR  --  2.93 2.62  CREATININE 0.75  --   --     Estimated Creatinine Clearance: 117.2 mL/min (by C-G formula based on SCr of 0.75 mg/dL).   Medical History: Past Medical History:  Diagnosis Date  . Alcohol abuse, in remission   . Atrial flutter (HCC)    Previously not any Coumadin candidate as the result of chronic GI blood loss  . Coronary artery disease    IMI in 09/1999->RCA stent, nl LAD, 30%, distal OM, RCA  95% distal lesion; nuclear in 07/2010-moderate-sized Inferior infarction; normal EF by echo in 07/2010  . GERD (gastroesophageal reflux disease)   . Hyperlipidemia   . Hypertension   . Myocardial infarction ([place])   . Sleep apnea    STOP BANG score of 4  . Ulcerative colitis    s/p subtotal colectomy    Medications:  Prescriptions Prior to Admission  Medication Sig Dispense Refill Last Dose  . amLODipine (NORVASC) 2.5 MG tablet Take 5 mg by mouth daily.    Past Week at Unknown time  . atorvastatin (LIPITOR) 80 MG tablet TAKE ONE TABLET BY MOUTH DAILY. 30 tablet 0   . cloNIDine (CATAPRES) 0.2 MG tablet Take 0.2 mg by mouth 2 (two) times daily.   Past Week at Unknown time  . cyclobenzaprine (FLEXERIL) 10 MG tablet Take 1 tablet (10 mg total) by mouth 2 (two) times daily as needed for muscle spasms. 20 tablet 0   . DELZICOL 400 MG CPDR DR capsule TAKE 2 CAPSULES BY MOUTH TWICE DAILY. 120 capsule 6 Past  Week at Unknown time  . furosemide (LASIX) 40 MG tablet Take 40 mg by mouth daily.    Past Week at Unknown time  . HYDROcodone-acetaminophen (NORCO) 7.5-325 MG per tablet Take 1 tablet by mouth 3 (three) times daily as needed for moderate pain.    Past Week at Unknown time  . lisinopril (PRINIVIL,ZESTRIL) 40 MG tablet Take 40 mg by mouth daily.   Past Week at Unknown time  . metoprolol (LOPRESSOR) 50 MG tablet Take 50 mg by mouth 2 (two) times daily.     Past Week at Unknown time  . NITROSTAT 0.4 MG SL tablet PLACE 1 TAB UNDER TONGUE EVERY 5 MIN IF NEEDED FOR CHEST PAIN. MAY USE 3 TIMES.NO RELIEF CALL 911. 25 tablet 4 unknown  . oxyCODONE-acetaminophen (PERCOCET) 5-325 MG tablet Take 1-2 tablets by mouth every 4 (four) hours as needed. 20 tablet 0   . potassium chloride SA (K-DUR,KLOR-CON) 20 MEQ tablet Take 20 mEq by mouth daily.     Past Week at Unknown time  . terazosin (HYTRIN) 5 MG capsule TAKE 2 CAPSULES BY MOUTH ONCE DAILY. (Patient taking differently: 5 mg. TAKE 1 CAPSULES BY MOUTH ONCE DAILY.) 60 capsule 6 Past Week at Unknown  time  . warfarin (COUMADIN) 2 MG tablet TAKE 1 TABLET BY MOUTH ONCE DAILY EXCEPT 2 TABLETS ON WEDNESDAYS AND SATURDAYS. 40 tablet 0   . zolpidem (AMBIEN) 10 MG tablet Take 10 mg by mouth at bedtime.    Past Week at Unknown time    Assessment: 76 yo man admitted with AMS to continue coumadin for afib.  INR 2.62 Goal of Therapy:  INR 2-3 Monitor platelets by anticoagulation protocol: Yes   Plan:  Coumadin 4 mg po today Check daily PT/INR Monitor for bleeding complications  Nasteho Glantz Poteet 2017/01/30,8:57 AM

## 2017-01-15 NOTE — Progress Notes (Addendum)
Pt asleep, doesn't respond to name or any verbal communication, unable to accurately score CIWA, will withhold ativan at this moment, will continue to monitor.

## 2017-01-15 NOTE — Plan of Care (Signed)
Problem: Safety: Goal: Ability to remain free from injury will improve Outcome: Progressing Safety sitter at bedside, soft wrist restraints in place

## 2017-01-15 NOTE — Progress Notes (Signed)
Dr. Willey Blade gave order to discontinue cardiac monitoring.

## 2017-01-15 NOTE — Progress Notes (Signed)
Subjective: Sedated and sleeping. Unable to awaken. He received Geodon and Ativan yesterday while agitated. He has not been awake through the night.  Objective: Vital signs in last 24 hours: Vitals:   01/10/2017 2230 01-20-2017 0000 Jan 20, 2017 0430 01-20-17 0600  BP: (!) 127/99 (!) 162/69 (!) 157/57 133/65  Pulse: 83 87 64 70  Resp: 20 20 20    Temp: 98.9 F (37.2 C) 99.3 F (37.4 C) 98.6 F (37 C)   TempSrc: Axillary Axillary Axillary   SpO2: 99% 99% 98%   Weight:      Height:       Weight change:   Intake/Output Summary (Last 24 hours) at 01/20/2017 0709 Last data filed at 01/20/2017 0400  Gross per 24 hour  Intake            542.5 ml  Output                0 ml  Net            542.5 ml    Physical Exam: No scleral icterus. Lungs clear. Heart irregularly irregular. Abdomen soft and nontender with no hepatosplenomegaly. Extremities reveal no edema. Bruising of right first toe. Dressings in place on legs.  Lab Results:    Results for orders placed or performed during the hospital encounter of 12/28/2016 (from the past 24 hour(s))  CBG monitoring, ED     Status: Abnormal   Collection Time: 01/04/2017  9:01 AM  Result Value Ref Range   Glucose-Capillary 125 (H) 65 - 99 mg/dL  Urinalysis, Routine w reflex microscopic     Status: Abnormal   Collection Time: 12/18/2016  9:06 AM  Result Value Ref Range   Color, Urine STRAW (A) YELLOW   APPearance CLEAR CLEAR   Specific Gravity, Urine 1.008 1.005 - 1.030   pH 7.0 5.0 - 8.0   Glucose, UA NEGATIVE NEGATIVE mg/dL   Hgb urine dipstick SMALL (A) NEGATIVE   Bilirubin Urine NEGATIVE NEGATIVE   Ketones, ur NEGATIVE NEGATIVE mg/dL   Protein, ur NEGATIVE NEGATIVE mg/dL   Nitrite NEGATIVE NEGATIVE   Leukocytes, UA NEGATIVE NEGATIVE   RBC / HPF 0-5 0 - 5 RBC/hpf   WBC, UA 0-5 0 - 5 WBC/hpf   Bacteria, UA NONE SEEN NONE SEEN   Squamous Epithelial / LPF 0-5 (A) NONE SEEN   Hyaline Casts, UA PRESENT   Comprehensive metabolic panel     Status:  Abnormal   Collection Time: 12/26/2016  9:25 AM  Result Value Ref Range   Sodium 140 135 - 145 mmol/L   Potassium 3.1 (L) 3.5 - 5.1 mmol/L   Chloride 100 (L) 101 - 111 mmol/L   CO2 31 22 - 32 mmol/L   Glucose, Bld 118 (H) 65 - 99 mg/dL   BUN 11 6 - 20 mg/dL   Creatinine, Ser 0.75 0.61 - 1.24 mg/dL   Calcium 8.9 8.9 - 10.3 mg/dL   Total Protein 6.9 6.5 - 8.1 g/dL   Albumin 3.4 (L) 3.5 - 5.0 g/dL   AST 22 15 - 41 U/L   ALT 15 (L) 17 - 63 U/L   Alkaline Phosphatase 65 38 - 126 U/L   Total Bilirubin 0.4 0.3 - 1.2 mg/dL   GFR calc non Af Amer >60 >60 mL/min   GFR calc Af Amer >60 >60 mL/min   Anion gap 9 5 - 15  CBC     Status: Abnormal   Collection Time: 01/03/2017  9:25 AM  Result Value  Ref Range   WBC 7.9 4.0 - 10.5 K/uL   RBC 4.64 4.22 - 5.81 MIL/uL   Hemoglobin 11.4 (L) 13.0 - 17.0 g/dL   HCT 37.5 (L) 39.0 - 52.0 %   MCV 80.8 78.0 - 100.0 fL   MCH 24.6 (L) 26.0 - 34.0 pg   MCHC 30.4 30.0 - 36.0 g/dL   RDW 16.5 (H) 11.5 - 15.5 %   Platelets 233 150 - 400 K/uL  I-Stat Troponin, ED (not at [facility])     Status: None   Collection Time: 01/14/2017  9:52 AM  Result Value Ref Range   Troponin i, poc 0.00 0.00 - 0.08 ng/mL   Comment 3          I-Stat CG4 Lactic Acid, ED     Status: None   Collection Time: 12/21/2016  9:54 AM  Result Value Ref Range   Lactic Acid, Venous 1.42 0.5 - 1.9 mmol/L  I-stat troponin, ED     Status: None   Collection Time: 12/28/2016 10:02 AM  Result Value Ref Range   Troponin i, poc 0.00 0.00 - 0.08 ng/mL   Comment 3          I-Stat CG4 Lactic Acid, ED     Status: None   Collection Time: 01/07/2017 10:05 AM  Result Value Ref Range   Lactic Acid, Venous 1.57 0.5 - 1.9 mmol/L  Ammonia     Status: None   Collection Time: 01/11/2017  3:20 PM  Result Value Ref Range   Ammonia 12 9 - 35 umol/L  TSH     Status: None   Collection Time: 01/07/2017  3:20 PM  Result Value Ref Range   TSH 1.262 0.350 - 4.500 uIU/mL  Protime-INR     Status: Abnormal   Collection Time:  12/27/2016  5:29 PM  Result Value Ref Range   Prothrombin Time 31.2 (H) 11.4 - 15.2 seconds   INR 2.93   Rapid urine drug screen (hospital performed)     Status: None   Collection Time: 01/07/2017  5:35 PM  Result Value Ref Range   Opiates NONE DETECTED NONE DETECTED   Cocaine NONE DETECTED NONE DETECTED   Benzodiazepines NONE DETECTED NONE DETECTED   Amphetamines NONE DETECTED NONE DETECTED   Tetrahydrocannabinol NONE DETECTED NONE DETECTED   Barbiturates NONE DETECTED NONE DETECTED  Lipid panel     Status: Abnormal   Collection Time: Feb 04, 2017  4:53 AM  Result Value Ref Range   Cholesterol 90 0 - 200 mg/dL   Triglycerides 84 <150 mg/dL   HDL 25 (L) >40 mg/dL   Total CHOL/HDL Ratio 3.6 RATIO   VLDL 17 0 - 40 mg/dL   LDL Cholesterol 48 0 - 99 mg/dL  Protime-INR     Status: Abnormal   Collection Time: 2017-02-04  4:53 AM  Result Value Ref Range   Prothrombin Time 28.5 (H) 11.4 - 15.2 seconds   INR 2.62      ABGS No results for input(s): PHART, PO2ART, TCO2, HCO3 in the last 72 hours.  Invalid input(s): PCO2 CULTURES No results found for this or any previous visit (from the past 240 hour(s)). Studies/Results: Ct Head Wo Contrast  Result Date: 01/02/2017 CLINICAL DATA:  Altered mental status EXAM: CT HEAD WITHOUT CONTRAST TECHNIQUE: Contiguous axial images were obtained from the base of the skull through the vertex without intravenous contrast. COMPARISON:  11/16/2014 FINDINGS: Brain: Chronic ischemic changes are present in the periventricular white matter. Mild global atrophy compared with  age. No mass effect, midline shift, or acute hemorrhage. Vascular: No hyperdense vessel or unexpected calcification. Skull: Cranium is intact. Sinuses/Orbits: Small mucous retention cyst in the left maxillary sinus. Mastoid air cells are clear. Orbits are unremarkable. Other: None. IMPRESSION: No acute intracranial pathology. Electronically Signed   By: Marybelle Killings M.D.   On: 12/24/2016 10:05   Dg  Chest Portable 1 View  Result Date: 12/25/2016 CLINICAL DATA:  Altered mental status EXAM: PORTABLE CHEST 1 VIEW COMPARISON:  11/16/2014 FINDINGS: Cardiac shadow remains enlarged. The aorta is tortuous. No acute infiltrate or sizable effusion is noted. No bony abnormality is seen. IMPRESSION: No active disease. Electronically Signed   By: Inez Catalina M.D.   On: 12/21/2016 09:22   Micro Results: No results found for this or any previous visit (from the past 240 hour(s)). Studies/Results: Ct Head Wo Contrast  Result Date: 12/17/2016 CLINICAL DATA:  Altered mental status EXAM: CT HEAD WITHOUT CONTRAST TECHNIQUE: Contiguous axial images were obtained from the base of the skull through the vertex without intravenous contrast. COMPARISON:  11/16/2014 FINDINGS: Brain: Chronic ischemic changes are present in the periventricular white matter. Mild global atrophy compared with age. No mass effect, midline shift, or acute hemorrhage. Vascular: No hyperdense vessel or unexpected calcification. Skull: Cranium is intact. Sinuses/Orbits: Small mucous retention cyst in the left maxillary sinus. Mastoid air cells are clear. Orbits are unremarkable. Other: None. IMPRESSION: No acute intracranial pathology. Electronically Signed   By: Marybelle Killings M.D.   On: 12/27/2016 10:05   Dg Chest Portable 1 View  Result Date: 12/31/2016 CLINICAL DATA:  Altered mental status EXAM: PORTABLE CHEST 1 VIEW COMPARISON:  11/16/2014 FINDINGS: Cardiac shadow remains enlarged. The aorta is tortuous. No acute infiltrate or sizable effusion is noted. No bony abnormality is seen. IMPRESSION: No active disease. Electronically Signed   By: Inez Catalina M.D.   On: 01/08/2017 09:22   Medications:  I have reviewed the patient's current medications Scheduled Meds: .  stroke: mapping our early stages of recovery book   Does not apply Once  . amLODipine  5 mg Oral Daily  . aspirin  300 mg Rectal Daily   Or  . aspirin  325 mg Oral Daily  .  atorvastatin  80 mg Oral Daily  . cloNIDine  0.2 mg Oral BID  . folic acid  1 mg Oral Daily  . lisinopril  40 mg Oral Daily  . LORazepam  0-4 mg Intravenous Q6H   Followed by  . [START ON 01/10/2017] LORazepam  0-4 mg Intravenous Q12H  . Mesalamine  800 mg Oral BID  . metoprolol  50 mg Oral BID  . multivitamin with minerals  1 tablet Oral Daily  . potassium chloride SA  20 mEq Oral Daily  . potassium chloride  40 mEq Oral BID  . terazosin  5 mg Oral QHS  . thiamine  100 mg Oral Daily   Or  . thiamine  100 mg Intravenous Daily   Continuous Infusions: . sodium chloride 50 mL/hr at Jan 15, 2017 0021   PRN Meds:.acetaminophen **OR** acetaminophen (TYLENOL) oral liquid 160 mg/5 mL **OR** acetaminophen, LORazepam **OR** LORazepam, senna-docusate   Assessment/Plan: #1. Acute encephalopathy. Etiology unclear. CT negative for any acute abnormalities. Urine drug screen is negative. Patient takes hydrocodone chronically for chronic back pain but tested negative. Neurology consultation pending. No fever. #2. Hypertension. Continue multidrug regimen when he is awake. #3. Chronic atrial fibrillation. Continue Coumadin. INR 2.6. #4. Hypokalemia. We'll supplement. Recheck pending. Active Problems:  Hyperlipidemia   Hypertension   Atrial fibrillation (HCC)   Chronic anticoagulation   GERD (gastroesophageal reflux disease)   Altered mental status   Delirium     LOS: 1 day   Kerman Pfost Jan 29, 2017, 7:09 AM

## 2017-01-15 DEATH — deceased

## 2017-02-15 NOTE — Accreditation Note (Signed)
o Restraints reported to CMS  Pursuant to regulation 482.13 (G) (3) use of restraints was logged and CMS was notified via email on 05.18.2018 at 1200 by Evette Cristal, RN, Patient Safety and Accreditation.
